# Patient Record
Sex: Male | Born: 1965 | Race: Black or African American | Hispanic: No | Marital: Single | Smoking: Current every day smoker
Health system: Southern US, Community
[De-identification: ages and names within clinical notes are randomized; demographics above are authoritative.]

## PROBLEM LIST (undated history)

## (undated) DIAGNOSIS — I10 Essential (primary) hypertension: Secondary | ICD-10-CM

## (undated) DIAGNOSIS — D649 Anemia, unspecified: Secondary | ICD-10-CM

## (undated) DIAGNOSIS — F1010 Alcohol abuse, uncomplicated: Secondary | ICD-10-CM

## (undated) DIAGNOSIS — J45909 Unspecified asthma, uncomplicated: Secondary | ICD-10-CM

## (undated) DIAGNOSIS — N179 Acute kidney failure, unspecified: Secondary | ICD-10-CM

## (undated) DIAGNOSIS — M1990 Unspecified osteoarthritis, unspecified site: Secondary | ICD-10-CM

## (undated) DIAGNOSIS — E538 Deficiency of other specified B group vitamins: Secondary | ICD-10-CM

## (undated) DIAGNOSIS — R195 Other fecal abnormalities: Secondary | ICD-10-CM

## (undated) HISTORY — PX: FRACTURE SURGERY: SHX138

## (undated) HISTORY — PX: COLONOSCOPY: SHX174

## (undated) HISTORY — PX: KNEE ARTHROCENTESIS: SUR44

---

## 2006-09-04 ENCOUNTER — Emergency Department: Payer: Self-pay | Admitting: Emergency Medicine

## 2009-11-20 ENCOUNTER — Ambulatory Visit: Payer: Self-pay | Admitting: Family Medicine

## 2009-12-07 ENCOUNTER — Ambulatory Visit: Payer: Self-pay | Admitting: Oncology

## 2009-12-12 ENCOUNTER — Ambulatory Visit: Payer: Self-pay | Admitting: Family Medicine

## 2009-12-12 ENCOUNTER — Ambulatory Visit: Payer: Self-pay | Admitting: Oncology

## 2010-01-07 ENCOUNTER — Ambulatory Visit: Payer: Self-pay | Admitting: Oncology

## 2010-05-12 ENCOUNTER — Ambulatory Visit: Payer: Self-pay | Admitting: Family Medicine

## 2010-10-21 ENCOUNTER — Ambulatory Visit: Payer: Self-pay | Admitting: Family Medicine

## 2010-10-28 ENCOUNTER — Ambulatory Visit: Payer: Self-pay | Admitting: Emergency Medicine

## 2012-08-17 ENCOUNTER — Ambulatory Visit: Payer: Self-pay | Admitting: Family Medicine

## 2012-08-18 ENCOUNTER — Encounter (HOSPITAL_COMMUNITY): Payer: Self-pay | Admitting: Emergency Medicine

## 2012-08-18 ENCOUNTER — Emergency Department (HOSPITAL_COMMUNITY)
Admission: EM | Admit: 2012-08-18 | Discharge: 2012-08-18 | Disposition: A | Discharge status: Home / Self Care | Payer: Medicaid Other | Attending: Emergency Medicine | Admitting: Emergency Medicine

## 2012-08-18 DIAGNOSIS — I10 Essential (primary) hypertension: Secondary | ICD-10-CM | POA: Insufficient documentation

## 2012-08-18 DIAGNOSIS — F17200 Nicotine dependence, unspecified, uncomplicated: Secondary | ICD-10-CM | POA: Insufficient documentation

## 2012-08-18 DIAGNOSIS — K047 Periapical abscess without sinus: Secondary | ICD-10-CM | POA: Insufficient documentation

## 2012-08-18 DIAGNOSIS — K029 Dental caries, unspecified: Secondary | ICD-10-CM | POA: Insufficient documentation

## 2012-08-18 HISTORY — DX: Essential (primary) hypertension: I10

## 2012-08-18 MED ORDER — OXYCODONE-ACETAMINOPHEN 5-325 MG PO TABS: ORAL_TABLET | ORAL | Status: DC

## 2012-08-18 MED ORDER — PENICILLIN V POTASSIUM 500 MG PO TABS: 500.0000 mg | ORAL_TABLET | Freq: Four times a day (QID) | ORAL | Status: AC

## 2012-08-18 MED ORDER — IBUPROFEN 800 MG PO TABS
800.0000 mg | ORAL_TABLET | Freq: Once | ORAL | Status: AC
  Administered 2012-08-18: 800 mg via ORAL
  Filled 2012-08-18: qty 1

## 2012-08-18 MED ORDER — OXYCODONE-ACETAMINOPHEN 5-325 MG PO TABS
1.0000 | ORAL_TABLET | Freq: Once | ORAL | Status: AC
  Administered 2012-08-18: 1 via ORAL
  Filled 2012-08-18: qty 1

## 2012-08-18 MED ORDER — PENICILLIN V POTASSIUM 250 MG PO TABS
500.0000 mg | ORAL_TABLET | Freq: Once | ORAL | Status: AC
  Administered 2012-08-18: 500 mg via ORAL
  Filled 2012-08-18: qty 2

## 2012-08-18 NOTE — ED Notes (Signed)
Pt presents with  Rt upper jaw/facial swelling secondary to an abscess in rt upper tooth. Pt has private dentist, however unable to see him today due to office closed. Pt states toothache started Monday.  Denies fever, N/v.

## 2012-08-18 NOTE — ED Provider Notes (Signed)
History     CSN: [id number]  Arrival date & time 08/18/12  1352   First MD Initiated Contact with Patient 08/18/12 1456      Chief Complaint  Patient presents with  . Dental Pain    (Consider location/radiation/quality/duration/timing/severity/associated sxs/prior treatment) HPI Comments: Tooth pain for 4 days.  Swelling getting worse over same time frame.  No fever or chills.   Has dental appt in 3 days.  The history is provided by the patient. No language interpreter was used.    Past Medical History  Diagnosis Date  . Hypertension     Past Surgical History  Procedure Date  . Knee arthrocentesis     No family history on file.  History  Substance Use Topics  . Smoking status: Current Every Day Smoker    Types: Cigarettes  . Smokeless tobacco: Not on file  . Alcohol Use: Yes      Review of Systems  HENT: Positive for dental problem.   Neurological: Positive for headaches.  All other systems reviewed and are negative.    Allergies  Review of patient's allergies indicates no known allergies.  Home Medications   Current Outpatient Rx  Name Route Sig Dispense Refill  . OXYCODONE-ACETAMINOPHEN 5-325 MG PO TABS  One tab po q 4-6 hrs prn pain 20 tablet 0  . PENICILLIN V POTASSIUM 500 MG PO TABS Oral Take 1 tablet (500 mg total) by mouth 4 (four) times daily. 40 tablet 0    BP 132/76  Pulse 93  Temp 98.8 F (37.1 C) (Oral)  Resp 20  Ht 5\' 7"  (1.702 m)  Wt 147 lb (66.679 kg)  BMI 23.02 kg/m2  SpO2 99%  Physical Exam  Nursing note and vitals reviewed. Constitutional: He is oriented to person, place, and time. He appears well-developed and well-nourished.  HENT:  Head: Normocephalic and atraumatic.    Mouth/Throat: Uvula is midline. Dental abscesses and dental caries present. No uvula swelling.    Eyes: EOM are normal.  Neck: Normal range of motion.  Cardiovascular: Normal rate, regular rhythm, normal heart sounds and intact distal pulses.     Pulmonary/Chest: Effort normal and breath sounds normal. No respiratory distress.  Abdominal: Soft. He exhibits no distension. There is no tenderness.  Musculoskeletal: Normal range of motion.  Neurological: He is alert and oriented to person, place, and time.  Skin: Skin is warm and dry.  Psychiatric: He has a normal mood and affect. Judgment normal.    ED Course  Procedures (including critical care time)  Labs Reviewed - No data to display No results found.   1. Dental abscess       MDM  rx-percocet, 20 rx-pen VK 500 mg QID x 10 days. Ibuprofen 800        Evalina Field, [place] 08/18/12 1546

## 2012-08-18 NOTE — ED Notes (Signed)
Pt c/o dental pain all week, but swelling started last night.

## 2012-08-22 NOTE — ED Provider Notes (Signed)
Medical screening examination/treatment/procedure(s) were performed by non-physician practitioner and as supervising physician I was immediately available for consultation/collaboration.   Benny Lennert, MD 08/22/12 1116

## 2013-04-30 ENCOUNTER — Encounter (HOSPITAL_COMMUNITY): Payer: Self-pay | Admitting: Emergency Medicine

## 2013-04-30 ENCOUNTER — Emergency Department (HOSPITAL_COMMUNITY): Payer: Medicaid Other

## 2013-04-30 ENCOUNTER — Emergency Department (HOSPITAL_COMMUNITY)
Admission: EM | Admit: 2013-04-30 | Discharge: 2013-04-30 | Disposition: A | Discharge status: Home / Self Care | Payer: Medicaid Other | Attending: Emergency Medicine | Admitting: Emergency Medicine

## 2013-04-30 DIAGNOSIS — Y9389 Activity, other specified: Secondary | ICD-10-CM | POA: Insufficient documentation

## 2013-04-30 DIAGNOSIS — W268XXA Contact with other sharp object(s), not elsewhere classified, initial encounter: Secondary | ICD-10-CM | POA: Insufficient documentation

## 2013-04-30 DIAGNOSIS — S61012A Laceration without foreign body of left thumb without damage to nail, initial encounter: Secondary | ICD-10-CM

## 2013-04-30 DIAGNOSIS — Z23 Encounter for immunization: Secondary | ICD-10-CM | POA: Insufficient documentation

## 2013-04-30 DIAGNOSIS — S61209A Unspecified open wound of unspecified finger without damage to nail, initial encounter: Secondary | ICD-10-CM | POA: Insufficient documentation

## 2013-04-30 DIAGNOSIS — I10 Essential (primary) hypertension: Secondary | ICD-10-CM | POA: Insufficient documentation

## 2013-04-30 DIAGNOSIS — Z791 Long term (current) use of non-steroidal anti-inflammatories (NSAID): Secondary | ICD-10-CM | POA: Insufficient documentation

## 2013-04-30 DIAGNOSIS — Y9289 Other specified places as the place of occurrence of the external cause: Secondary | ICD-10-CM | POA: Insufficient documentation

## 2013-04-30 HISTORY — DX: Alcohol abuse, uncomplicated: F10.10

## 2013-04-30 MED ORDER — LIDOCAINE HCL (PF) 2 % IJ SOLN
10.0000 mL | Freq: Once | INTRAMUSCULAR | Status: AC
  Administered 2013-04-30: 10 mL
  Filled 2013-04-30: qty 10

## 2013-04-30 MED ORDER — BACITRACIN ZINC 500 UNIT/GM EX OINT
TOPICAL_OINTMENT | CUTANEOUS | Status: AC
  Administered 2013-04-30: 1
  Filled 2013-04-30: qty 0.9

## 2013-04-30 MED ORDER — DOUBLE ANTIBIOTIC 500-10000 UNIT/GM EX OINT
TOPICAL_OINTMENT | Freq: Once | CUTANEOUS | Status: DC
  Filled 2013-04-30: qty 1

## 2013-04-30 MED ORDER — CEPHALEXIN 250 MG PO CAPS: 500.0000 mg | ORAL_CAPSULE | Freq: Two times a day (BID) | ORAL | Status: DC

## 2013-04-30 MED ORDER — TETANUS-DIPHTH-ACELL PERTUSSIS 5-2.5-18.5 LF-MCG/0.5 IM SUSP
0.5000 mL | Freq: Once | INTRAMUSCULAR | Status: AC
  Administered 2013-04-30: 0.5 mL via INTRAMUSCULAR
  Filled 2013-04-30: qty 0.5

## 2013-04-30 NOTE — ED Provider Notes (Addendum)
History     CSN: [id number]  Arrival date & time 04/30/13  1157   First MD Initiated Contact with Patient 04/30/13 1218      Chief Complaint  Patient presents with  . Laceration    (Consider location/radiation/quality/duration/timing/severity/associated sxs/prior treatment) HPI Wesley Francis is a 47 y.o. male who presents to the ED with a laceration to his left thumb. He cut the thumb just prior to arrival on a grill he was taking off a truck. He applied pressure and come to the ED. He is unsure of his last tetanus.  Past Medical History  Diagnosis Date  . Hypertension   . ETOH abuse     Past Surgical History  Procedure Laterality Date  . Knee arthrocentesis      Family History  Problem Relation Age of Onset  . Hypertension Mother   . Stroke Mother   . Hypertension Father     History  Substance Use Topics  . Smoking status: Current Every Day Smoker -- 1.00 packs/day    Types: Cigarettes  . Smokeless tobacco: Not on file  . Alcohol Use: Yes     Comment: Everyday drinker, whiskey      Review of Systems  Constitutional: Negative for fever.  HENT: Negative for neck pain.   Musculoskeletal:       Left thumb laceration, irregular, deep jagged.  Skin: Positive for wound.  Psychiatric/Behavioral: The patient is not nervous/anxious.     Allergies  Review of patient's allergies indicates no known allergies.  Home Medications   Current Outpatient Rx  Name  Route  Sig  Dispense  Refill  . naproxen sodium (ANAPROX) 220 MG tablet   Oral   Take 220 mg by mouth 2 (two) times daily with a meal.         . oxyCODONE-acetaminophen (PERCOCET/ROXICET) 5-325 MG per tablet      One tab po q 4-6 hrs prn pain   20 tablet   0     BP 118/80  Pulse 94  Temp(Src) 98.5 F (36.9 C) (Oral)  Resp 20  Ht 5\' 7"  (1.702 m)  Wt 160 lb (72.576 kg)  BMI 25.05 kg/m2  SpO2 96%  Physical Exam  Constitutional: He is oriented to person, place, and time. He appears  well-developed and well-nourished. No distress.  HENT:  Head: Normocephalic.  Eyes: EOM are normal.  Neck: Neck supple.  Cardiovascular: Normal rate.   Pulmonary/Chest: Effort normal.  Musculoskeletal:       Hands: Neurological: He is alert and oriented to person, place, and time.  Skin:  Laceration hand   Psychiatric: He has a normal mood and affect. His behavior is normal.    ED Course  Procedures (including critical care time)  LACERATION REPAIR Performed by: Jamesa Tedrick Authorized by: Alantis Bethune Consent: Verbal consent obtained. Risks and benefits: risks, benefits and alternatives were discussed Consent given by: patient Patient identity confirmed: provided demographic data Prepped and Draped in normal sterile fashion Wound explored  Laceration Location: left thumb  Laceration Length: 3 cm  No Foreign Bodies seen or palpated  Anesthesia: local infiltration  Local anesthetic: lidocaine 2% without epinephrine  Anesthetic total: 4 ml  Irrigation method: syringe Amount of cleaning: cleaned with betadine scrub brush and irrigated with 1000 ccs NSS  Skin closure: 4-0 prolene  Number of sutures: 8  Technique: interrupted  Patient tolerance: Patient tolerated the procedure well with no immediate complications.  Labs Reviewed - No data to display Dg Finger Thumb Left  04/30/2013   *RADIOLOGY REPORT*  Clinical Data: Left thumb pain and laceration following an injury.  LEFT THUMB 2+V  Comparison: None.  Findings: Large soft tissue defect in the ventral aspect of the left thumb at the level of the proximal phalanx.  This appears to extend to the underlying bone without fracture or radiopaque foreign body.  Mild first MCP joint degenerative changes.  IMPRESSION: Large soft tissue laceration without fracture or radiopaque foreign body.   Original Report Authenticated By: Beckie Salts, M.D.    MDM  47 y.o. male with deep laceration to the left hand, thumb area. Will d/c  patient home with antibiotics and he will follow up with his PCP in one week for suture removal or return here sooner for problems.    Medication List    TAKE these medications       cephALEXin 250 MG capsule  Commonly known as:  KEFLEX  Take 2 capsules (500 mg total) by mouth 2 (two) times daily.      ASK your doctor about these medications       naproxen sodium 220 MG tablet  Commonly known as:  ANAPROX  Take 220 mg by mouth 2 (two) times daily with a meal.     oxyCODONE-acetaminophen 5-325 MG per tablet  Commonly known as:  PERCOCET/ROXICET  One tab po q 4-6 hrs prn pain               Janne Napoleon, NP 04/30/13 1744  Sherrin Stahle Orlene Och, NP 05/05/13 1708

## 2013-04-30 NOTE — ED Notes (Signed)
Family reports they think the pt had tetanus shot at the health dept "not too long ago." Unsure of exact time frame.

## 2013-04-30 NOTE — ED Notes (Signed)
Pt with deep laceration to L thumb. Cut on a grill he was trying to get off a truck.

## 2013-05-01 NOTE — ED Provider Notes (Signed)
Medical screening examination/treatment/procedure(s) were performed by non-physician practitioner and as supervising physician I was immediately available for consultation/collaboration.   Ariel Wingrove, MD 05/01/13 0806 

## 2013-05-06 NOTE — ED Provider Notes (Signed)
Medical screening examination/treatment/procedure(s) were performed by non-physician practitioner and as supervising physician I was immediately available for consultation/collaboration.  Jaunice Mirza, MD 05/06/13 0023 

## 2013-05-22 ENCOUNTER — Ambulatory Visit (HOSPITAL_COMMUNITY): Payer: Medicaid Other | Admitting: Physical Therapy

## 2014-05-13 ENCOUNTER — Emergency Department: Payer: Self-pay | Admitting: Emergency Medicine

## 2015-02-07 ENCOUNTER — Encounter (HOSPITAL_COMMUNITY): Payer: Self-pay | Admitting: *Deleted

## 2015-02-07 ENCOUNTER — Emergency Department (HOSPITAL_COMMUNITY)
Admission: EM | Admit: 2015-02-07 | Discharge: 2015-02-07 | Disposition: A | Discharge status: Home / Self Care | Payer: Medicaid Other | Source: Home / Self Care | Attending: Emergency Medicine | Admitting: Emergency Medicine

## 2015-02-07 ENCOUNTER — Emergency Department (HOSPITAL_COMMUNITY)
Admission: EM | Admit: 2015-02-07 | Discharge: 2015-02-07 | Disposition: A | Discharge status: Home / Self Care | Payer: Medicaid Other | Attending: Emergency Medicine | Admitting: Emergency Medicine

## 2015-02-07 ENCOUNTER — Encounter (HOSPITAL_COMMUNITY): Payer: Self-pay | Admitting: Emergency Medicine

## 2015-02-07 DIAGNOSIS — Z791 Long term (current) use of non-steroidal anti-inflammatories (NSAID): Secondary | ICD-10-CM | POA: Insufficient documentation

## 2015-02-07 DIAGNOSIS — Z79899 Other long term (current) drug therapy: Secondary | ICD-10-CM

## 2015-02-07 DIAGNOSIS — R195 Other fecal abnormalities: Secondary | ICD-10-CM | POA: Insufficient documentation

## 2015-02-07 DIAGNOSIS — Z720 Tobacco use: Secondary | ICD-10-CM | POA: Insufficient documentation

## 2015-02-07 DIAGNOSIS — R040 Epistaxis: Secondary | ICD-10-CM | POA: Insufficient documentation

## 2015-02-07 DIAGNOSIS — I10 Essential (primary) hypertension: Secondary | ICD-10-CM | POA: Insufficient documentation

## 2015-02-07 DIAGNOSIS — R05 Cough: Secondary | ICD-10-CM

## 2015-02-07 DIAGNOSIS — R61 Generalized hyperhidrosis: Secondary | ICD-10-CM | POA: Insufficient documentation

## 2015-02-07 DIAGNOSIS — Z792 Long term (current) use of antibiotics: Secondary | ICD-10-CM | POA: Insufficient documentation

## 2015-02-07 MED ORDER — DM-GUAIFENESIN ER 30-600 MG PO TB12: 1.0000 | ORAL_TABLET | Freq: Two times a day (BID) | ORAL | Status: DC

## 2015-02-07 MED ORDER — AMLODIPINE BESYLATE 5 MG PO TABS
5.0000 mg | ORAL_TABLET | Freq: Once | ORAL | Status: AC
  Administered 2015-02-07: 5 mg via ORAL
  Filled 2015-02-07: qty 1

## 2015-02-07 MED ORDER — SILVER NITRATE-POT NITRATE 75-25 % EX MISC
CUTANEOUS | Status: AC
  Filled 2015-02-07: qty 1

## 2015-02-07 MED ORDER — HYDROCHLOROTHIAZIDE 25 MG PO TABS: 25.0000 mg | ORAL_TABLET | Freq: Every day | ORAL | Status: DC

## 2015-02-07 MED ORDER — BACITRACIN-NEOMYCIN-POLYMYXIN 400-5-5000 EX OINT
TOPICAL_OINTMENT | CUTANEOUS | Status: AC
  Filled 2015-02-07: qty 1

## 2015-02-07 NOTE — ED Provider Notes (Addendum)
CSN: [id number]     Arrival date & time 02/07/15  0820 History  This chart was scribed for Nat Christen, MD by Chester Holstein, ED Scribe. This patient was seen in room APA04/APA04 and the patient's care was started at 8:29 AM.    Chief Complaint  Patient presents with  . Epistaxis     Patient is a 49 y.o. male presenting with nosebleeds. The history is provided by the patient. No language interpreter was used.  Epistaxis  HPI Comments: Wesley Francis is a 49 y.o. male with PMHx of HTN and EtOH abuse who presents to the Emergency Department complaining of intermittent right sided epistaxis with onset yesterday morning. Pt notes last bleed was at 2:30 AM. Pt states bending over causes increased bleeding. Pt is not on blood thinners. Pt is not compliant with his BP medication. Pt's BP 157/108 in exam room. Pt is being seen by a speech therapist for disability workup. Pt's wife denies h/o CVA and noted changes in speech are recent. Pt denies any other medical complaints. Pt has an appointment with PCP this week. Pt's PCP is [facility].   Past Medical History  Diagnosis Date  . Hypertension   . ETOH abuse    Past Surgical History  Procedure Laterality Date  . Knee arthrocentesis     Family History  Problem Relation Age of Onset  . Hypertension Mother   . Stroke Mother   . Hypertension Father    History  Substance Use Topics  . Smoking status: Current Every Day Smoker -- 1.00 packs/day    Types: Cigarettes  . Smokeless tobacco: Not on file  . Alcohol Use: Yes     Comment: Everyday drinker, whiskey    Review of Systems  HENT: Positive for nosebleeds.    A complete 10 system review of systems was obtained and all systems are negative except as noted in the HPI and PMH.     Allergies  Review of patient's allergies indicates no known allergies.  Home Medications   Prior to Admission medications   Medication Sig Start Date End Date Taking? Authorizing Provider  cephALEXin  (KEFLEX) 250 MG capsule Take 2 capsules (500 mg total) by mouth 2 (two) times daily. 04/30/13   Hope Bunnie Pion, NP  hydrochlorothiazide (HYDRODIURIL) 25 MG tablet Take 1 tablet (25 mg total) by mouth daily. 02/07/15   Nat Christen, MD  naproxen sodium (ANAPROX) 220 MG tablet Take 220 mg by mouth 2 (two) times daily with a meal.    Historical Provider, MD  oxyCODONE-acetaminophen (PERCOCET/ROXICET) 5-325 MG per tablet One tab po q 4-6 hrs prn pain 08/18/12   Richard Sabra Heck, PA-C   BP 142/101 mmHg  Pulse 70  Temp(Src) 97.7 F (36.5 C) (Oral)  Resp 18  Ht 5\' 10"  (1.778 m)  Wt 160 lb (72.576 kg)  BMI 22.96 kg/m2  SpO2 100% Physical Exam  Constitutional: He is oriented to person, place, and time. He appears well-developed and well-nourished.  HENT:  Head: Normocephalic and atraumatic.  Clotted blood in the medial aspect of right nares  Eyes: Conjunctivae and EOM are normal. Pupils are equal, round, and reactive to light.  Neck: Normal range of motion. Neck supple.  Cardiovascular: Normal rate and regular rhythm.   Pulmonary/Chest: Effort normal and breath sounds normal.  Abdominal: Soft. Bowel sounds are normal.  Musculoskeletal: Normal range of motion.  Neurological: He is alert and oriented to person, place, and time.  Skin: Skin is warm and dry.  Psychiatric:  He has a normal mood and affect. His behavior is normal.  Nursing note and vitals reviewed.   ED Course  EPISTAXIS MANAGEMENT Date/Time: 02/07/2015 10:00 AM Performed by: Nat Christen Authorized by: Nat Christen Consent: Verbal consent obtained. Risks and benefits: risks, benefits and alternatives were discussed Consent given by: patient Patient understanding: patient states understanding of the procedure being performed Patient identity confirmed: verbally with patient Patient sedated: no Treatment site: right posterior Post-procedure assessment: bleeding stopped Treatment complexity: simple Comments: Patient examined with  headlight, nasal forceps.  No anterior bleeding. Oropharyngeal area examined. No obvious blood in throat.   (including critical care time) DIAGNOSTIC STUDIES: Oxygen Saturation is 100% on room air, normal by my interpretation.    COORDINATION OF CARE: 8:31 AM Discussed treatment plan with patient at beside, the patient agrees with the plan and has no further questions at this time.   Labs Review Labs Reviewed - No data to display  Imaging Review No results found.   EKG Interpretation None     No results found for this or any previous visit. No results found.   MDM   Final diagnoses:  Right-sided epistaxis  Essential hypertension   Right nares examined. No active bleeding. Discussed treatment options including vaporizer, saline drops, Neosporin ointment. Will start hydrochlorothiazide 25 mg daily. This was discussed with the patient and his sister. He will get primary care follow-up.    I personally performed the services described in this documentation, which was scribed in my presence. The recorded information has been reviewed and is accurate.     Nat Christen, MD 02/07/15 [place], MD 02/20/15 2207

## 2015-02-07 NOTE — Discharge Instructions (Signed)
Nosebleed A nosebleed can be caused by many things, including:  Getting hit hard in the nose.  Infections.  Dry nose.  Colds.  Medicines. Your doctor may do lab testing if you get nosebleeds a lot and the cause is not known. HOME CARE   If your nose was packed with material, keep it there until your doctor takes it out. Put the pack back in your nose if the pack falls out.  Do not blow your nose for 12 hours after the nosebleed.  Sit up and bend forward if your nose starts bleeding again. Pinch the front half of your nose nonstop for 20 minutes.  Put petroleum jelly inside your nose every morning if you have a dry nose.  Use a humidifier to make the air less dry.  Do not take aspirin.  Try not to strain, lift, or bend at the waist for many days after the nosebleed. GET HELP RIGHT AWAY IF:   Nosebleeds keep happening and are hard to stop or control.  You have bleeding or bruises that are not normal on other parts of the body.  You have a fever.  The nosebleeds get worse.  You get lightheaded, feel faint, sweaty, or throw up (vomit) blood. MAKE SURE YOU:   Understand these instructions.  Will watch your condition.  Will get help right away if you are not doing well or get worse. Document Released: 09/01/2008 Document Revised: 02/15/2012 Document Reviewed: 09/01/2008 [facility] Patient Information 2015 [place], [place]. This information is not intended to replace advice given to you by your health care provider. Make sure you discuss any questions you have with your health care provider.   Start taking the Mucinex DM. Usingtechnique that we described. Bleeding does not stop return. If bleeding and set being recurrent even small amounts over the next several weeks follow-up with ear nose and throat.

## 2015-02-07 NOTE — ED Notes (Signed)
Social work at bedside.  

## 2015-02-07 NOTE — ED Notes (Signed)
Pt recently discharged this morning for nose bleed. Pt states nose began bleeding again 30 min after leaving the ED

## 2015-02-07 NOTE — ED Notes (Signed)
Pt. Wanting to fill out advance directive and have notarized while here. Social work notified to go over paperwork with pt.

## 2015-02-07 NOTE — ED Provider Notes (Signed)
CSN: [id number]     Arrival date & time 02/07/15  1226 History  This chart was scribed for Fredia Sorrow, MD by Chester Holstein, ED Scribe. This patient was seen in room APA19/APA19 and the patient's care was started at 1:33 PM.    Chief Complaint  Patient presents with  . Epistaxis    Patient is a 49 y.o. male presenting with nosebleeds. The history is provided by the patient and the spouse. No language interpreter was used.  Epistaxis Associated symptoms: cough   Associated symptoms: no congestion, no fever, no headaches and no sore throat     HPI Comments: Wesley Francis is a 49 y.o. male who presents to the Emergency Department complaining of recurrent right sided epistaxis just PTA. Pt notes he bled twice yesterday. Pt was seen and discharged from ED earlier today for same. Pt states he went to [facility] to fill his prescription and states right nare began bleeding again. Pt's spouse notes she held pt's nose with head back with no relief. Pt noted post nasal blood in throat. Pt notes associated productive cough, rhinorrhea, and hematochezia from swallowing blood  Pt denies any other medical complaints. Pt denies headache, abdominal pain, chest pain, and SOB.   Past Medical History  Diagnosis Date  . Hypertension   . ETOH abuse    Past Surgical History  Procedure Laterality Date  . Knee arthrocentesis     Family History  Problem Relation Age of Onset  . Hypertension Mother   . Stroke Mother   . Hypertension Father    History  Substance Use Topics  . Smoking status: Current Every Day Smoker -- 1.00 packs/day    Types: Cigarettes  . Smokeless tobacco: Not on file  . Alcohol Use: Yes     Comment: Everyday drinker, whiskey    Review of Systems  Constitutional: Positive for diaphoresis. Negative for fever and chills.  HENT: Positive for nosebleeds, postnasal drip (blood) and rhinorrhea. Negative for congestion and sore throat.   Eyes: Negative for visual disturbance.   Respiratory: Positive for cough. Negative for shortness of breath.   Cardiovascular: Negative for chest pain and leg swelling.  Gastrointestinal: Positive for blood in stool (pt reports swallowing blood). Negative for nausea, vomiting, abdominal pain and diarrhea.  Genitourinary: Negative for dysuria.  Musculoskeletal: Negative for back pain.  Skin: Negative for rash.  Neurological: Negative for headaches.  Hematological: Does not bruise/bleed easily.  Psychiatric/Behavioral: Negative for confusion.      Allergies  Review of patient's allergies indicates no known allergies.  Home Medications   Prior to Admission medications   Medication Sig Start Date End Date Taking? Authorizing Provider  Cholecalciferol (VITAMIN D PO) Take 1 tablet by mouth daily.   Yes Historical Provider, MD  naproxen sodium (ANAPROX) 220 MG tablet Take 220 mg by mouth daily as needed (pain).    Yes Historical Provider, MD  cephALEXin (KEFLEX) 250 MG capsule Take 2 capsules (500 mg total) by mouth 2 (two) times daily. Patient not taking: Reported on 02/07/2015 04/30/13   Ashley Murrain, NP  dextromethorphan-[facility]in [facility] DM) 30-600 MG per 12 hr tablet Take 1 tablet by mouth 2 (two) times daily. 02/07/15   Fredia Sorrow, MD  hydrochlorothiazide (HYDRODIURIL) 25 MG tablet Take 1 tablet (25 mg total) by mouth daily. 02/07/15   Nat Christen, MD  oxyCODONE-acetaminophen (PERCOCET/ROXICET) 5-325 MG per tablet One tab po q 4-6 hrs prn pain Patient not taking: Reported on 02/07/2015 08/18/12   Jennye Boroughs,  PA-C   BP 135/99 mmHg  Pulse 98  Temp(Src) 99.7 F (37.6 C) (Oral)  Resp 16  SpO2 100% Physical Exam  Constitutional: He is oriented to person, place, and time. He appears well-developed and well-nourished.  HENT:  Head: Normocephalic.  Mild irritation to bilateral nares; no active bleeding noted.  Eyes: Conjunctivae and EOM are normal. Pupils are equal, round, and reactive to light.  Neck: Normal range of  motion. Neck supple.  Cardiovascular: Normal rate, regular rhythm and normal heart sounds.  Exam reveals no friction rub.   No murmur heard. Pulmonary/Chest: Effort normal and breath sounds normal. No respiratory distress. He has no wheezes. He has no rales.  Abdominal: Soft. Bowel sounds are normal. There is no tenderness.  Musculoskeletal: Normal range of motion. He exhibits no edema.  Neurological: He is alert and oriented to person, place, and time. No cranial nerve deficit. He exhibits normal muscle tone. Coordination normal.  Skin: Skin is warm and dry.  Psychiatric: He has a normal mood and affect. His behavior is normal.  Nursing note and vitals reviewed.   ED Course  Procedures (including critical care time) DIAGNOSTIC STUDIES: Oxygen Saturation is 99% on room air, normal by my interpretation.    COORDINATION OF CARE: 2:11 PM Discussed treatment plan with patient at beside including 20 minutes of continuous pressure with head down and blowing nose to release clots, repeating procedure if necessary, the patient agrees with the plan and has no further questions at this time.   Labs Review Labs Reviewed - No data to display  Imaging Review No results found.   EKG Interpretation None      MDM   Final diagnoses:  Epistaxis   No activebleeding currently. Patient given instructions on how to deal with recurrent nosebleed to the pinch technique. We'll also start on Mucinex DM. Referral to ear nose and throat provided if that does not resolve itself over a few days. Instructions on when to return provided. Patient does not clinically warranted Rhino Rocket at this time. However it was offered if he is overly concerned patient decided that he did not want that is not clinically inappropriate.  I personally performed the services described in this documentation, which was scribed in my presence. The recorded information has been reviewed and is accurate.      Fredia Sorrow, MD 02/07/15 1426

## 2015-02-07 NOTE — ED Notes (Signed)
MD at bedside. 

## 2015-02-07 NOTE — ED Notes (Signed)
Pt c/o intermittent nosebleed since yesterday. Pt states right side has been bleeding since 0230 this am.

## 2015-02-07 NOTE — ED Notes (Signed)
Pt spouse states his nose starred bleeding again just a few minutes after leaving here. Pt has a gauze inserted in his right nostril

## 2015-02-07 NOTE — Discharge Instructions (Signed)
Salt water drops to nose, antibiotic ointment, vaporizer;  Start new blood pressure medication.  You will need primary care follow up

## 2015-02-07 NOTE — ED Notes (Signed)
Social work called to provide pt. With advance directive info.

## 2015-02-07 NOTE — ED Notes (Signed)
Suction and ENT head light at bedside for EDP.

## 2015-02-09 ENCOUNTER — Emergency Department: Payer: Self-pay | Admitting: Student

## 2016-04-03 ENCOUNTER — Encounter (HOSPITAL_COMMUNITY): Payer: Self-pay | Admitting: Emergency Medicine

## 2016-04-03 ENCOUNTER — Emergency Department (HOSPITAL_COMMUNITY): Payer: Medicaid Other

## 2016-04-03 ENCOUNTER — Inpatient Hospital Stay (HOSPITAL_COMMUNITY)
Admission: EM | Admit: 2016-04-03 | Discharge: 2016-04-06 | DRG: 684 | Disposition: A | Discharge status: Home Health | Payer: Medicaid Other | Attending: Internal Medicine | Admitting: Internal Medicine

## 2016-04-03 DIAGNOSIS — E538 Deficiency of other specified B group vitamins: Secondary | ICD-10-CM | POA: Diagnosis present

## 2016-04-03 DIAGNOSIS — F1010 Alcohol abuse, uncomplicated: Secondary | ICD-10-CM | POA: Diagnosis present

## 2016-04-03 DIAGNOSIS — M47816 Spondylosis without myelopathy or radiculopathy, lumbar region: Secondary | ICD-10-CM

## 2016-04-03 DIAGNOSIS — M545 Low back pain: Secondary | ICD-10-CM

## 2016-04-03 DIAGNOSIS — M5440 Lumbago with sciatica, unspecified side: Secondary | ICD-10-CM | POA: Diagnosis present

## 2016-04-03 DIAGNOSIS — R0989 Other specified symptoms and signs involving the circulatory and respiratory systems: Secondary | ICD-10-CM | POA: Insufficient documentation

## 2016-04-03 DIAGNOSIS — F10120 Alcohol abuse with intoxication, uncomplicated: Secondary | ICD-10-CM | POA: Diagnosis present

## 2016-04-03 DIAGNOSIS — R195 Other fecal abnormalities: Secondary | ICD-10-CM | POA: Diagnosis present

## 2016-04-03 DIAGNOSIS — Z86010 Personal history of colonic polyps: Secondary | ICD-10-CM

## 2016-04-03 DIAGNOSIS — F10929 Alcohol use, unspecified with intoxication, unspecified: Secondary | ICD-10-CM | POA: Insufficient documentation

## 2016-04-03 DIAGNOSIS — F17210 Nicotine dependence, cigarettes, uncomplicated: Secondary | ICD-10-CM | POA: Diagnosis present

## 2016-04-03 DIAGNOSIS — N179 Acute kidney failure, unspecified: Principal | ICD-10-CM | POA: Diagnosis present

## 2016-04-03 DIAGNOSIS — M79604 Pain in right leg: Secondary | ICD-10-CM | POA: Diagnosis present

## 2016-04-03 DIAGNOSIS — I10 Essential (primary) hypertension: Secondary | ICD-10-CM | POA: Diagnosis present

## 2016-04-03 DIAGNOSIS — D649 Anemia, unspecified: Secondary | ICD-10-CM | POA: Diagnosis present

## 2016-04-03 DIAGNOSIS — G8929 Other chronic pain: Secondary | ICD-10-CM | POA: Diagnosis present

## 2016-04-03 DIAGNOSIS — Z720 Tobacco use: Secondary | ICD-10-CM | POA: Diagnosis present

## 2016-04-03 DIAGNOSIS — M79606 Pain in leg, unspecified: Secondary | ICD-10-CM

## 2016-04-03 DIAGNOSIS — N289 Disorder of kidney and ureter, unspecified: Secondary | ICD-10-CM

## 2016-04-03 HISTORY — DX: Deficiency of other specified B group vitamins: E53.8

## 2016-04-03 HISTORY — DX: Other fecal abnormalities: R19.5

## 2016-04-03 HISTORY — DX: Acute kidney failure, unspecified: N17.9

## 2016-04-03 LAB — CBC WITH DIFFERENTIAL/PLATELET
BASOS PCT: 1 %
Basophils Absolute: 0 10*3/uL (ref 0.0–0.1)
EOS ABS: 0.1 10*3/uL (ref 0.0–0.7)
Eosinophils Relative: 2 %
HCT: 27.1 % — ABNORMAL LOW (ref 39.0–52.0)
Hemoglobin: 9.2 g/dL — ABNORMAL LOW (ref 13.0–17.0)
LYMPHS ABS: 0.9 10*3/uL (ref 0.7–4.0)
Lymphocytes Relative: 23 %
MCH: 29.1 pg (ref 26.0–34.0)
MCHC: 33.9 g/dL (ref 30.0–36.0)
MCV: 85.8 fL (ref 78.0–100.0)
MONO ABS: 0.5 10*3/uL (ref 0.1–1.0)
MONOS PCT: 13 %
Neutro Abs: 2.4 10*3/uL (ref 1.7–7.7)
Neutrophils Relative %: 61 %
Platelets: 189 10*3/uL (ref 150–400)
RBC: 3.16 MIL/uL — ABNORMAL LOW (ref 4.22–5.81)
RDW: 17.8 % — AB (ref 11.5–15.5)
WBC: 4 10*3/uL (ref 4.0–10.5)

## 2016-04-03 LAB — COMPREHENSIVE METABOLIC PANEL
ALBUMIN: 4.4 g/dL (ref 3.5–5.0)
ALK PHOS: 18 U/L — AB (ref 38–126)
ALT: 29 U/L (ref 17–63)
AST: 113 U/L — AB (ref 15–41)
Anion gap: 19 — ABNORMAL HIGH (ref 5–15)
BILIRUBIN TOTAL: 0.7 mg/dL (ref 0.3–1.2)
BUN: 67 mg/dL — AB (ref 6–20)
CALCIUM: 9.3 mg/dL (ref 8.9–10.3)
CO2: 24 mmol/L (ref 22–32)
CREATININE: 3.89 mg/dL — AB (ref 0.61–1.24)
Chloride: 92 mmol/L — ABNORMAL LOW (ref 101–111)
GFR calc Af Amer: 19 mL/min — ABNORMAL LOW (ref >60)
GFR calc non Af Amer: 17 mL/min — ABNORMAL LOW (ref >60)
GLUCOSE: 75 mg/dL (ref 65–99)
Potassium: 3.9 mmol/L (ref 3.5–5.1)
Sodium: 135 mmol/L (ref 135–145)
TOTAL PROTEIN: 7.4 g/dL (ref 6.5–8.1)

## 2016-04-03 LAB — URINE MICROSCOPIC-ADD ON

## 2016-04-03 LAB — ETHANOL: Alcohol, Ethyl (B): 289 mg/dL — ABNORMAL HIGH (ref <5)

## 2016-04-03 LAB — RAPID URINE DRUG SCREEN, HOSP PERFORMED
AMPHETAMINES: NOT DETECTED
BENZODIAZEPINES: NOT DETECTED
Barbiturates: NOT DETECTED
COCAINE: NOT DETECTED
OPIATES: NOT DETECTED
TETRAHYDROCANNABINOL: NOT DETECTED

## 2016-04-03 LAB — URINALYSIS, ROUTINE W REFLEX MICROSCOPIC
GLUCOSE, UA: NEGATIVE mg/dL
Leukocytes, UA: NEGATIVE
Nitrite: NEGATIVE
PH: 5.5 (ref 5.0–8.0)
Protein, ur: 30 mg/dL — AB
SPECIFIC GRAVITY, URINE: 1.02 (ref 1.005–1.030)

## 2016-04-03 NOTE — ED Notes (Addendum)
Pt admits to drinking a beer & some sips of liquor today. Pain has been ongoing the past month.  Says uses alcohol to help w/ pain. Family concerned about pt drinking.

## 2016-04-03 NOTE — ED Notes (Addendum)
Patient complaining of headache and bilateral leg pain x 2 days. Patient states "just cut my legs off, I'm tired of them hurting." Family member states he was seen by health department and given B-12 "to see if that helps." Patient admits to drinking "one beer" today.

## 2016-04-03 NOTE — ED Provider Notes (Signed)
CSN: [id number]     Arrival date & time 04/03/16  2025 History  By signing my name below, I, Dora Sims, attest that this documentation has been prepared under the direction and in the presence of physician practitioner, Daleen Bo, MD. Electronically Signed: Dora Sims, Scribe. 04/03/2016. 8:52 PM.   Chief Complaint  Patient presents with  . Leg Pain  . Headache    The history is provided by the patient and a relative. No language interpreter was used.    HPI Comments: Wesley Francis is a 50 y.o. male with h/o arthritis and HTN who presents to the Emergency Department complaining of constant, worsening, bilateral leg pain for the last 7 months. Pt reports that his leg pain radiates throughout his entire legs bilaterally. Per his family, pt experienced a fall around a month ago due to his legs "giving out" on him; he did not pass out. His legs have been "giving out" more often since the fall. He does not ambulate with a cane. He was prescribed B12 medication during his visit to the health department one month ago. He regularly takes HTN medication, B12, Vitamin D, ibuprofen, and Tylenol. Pt endorses bilateral knee pain exacerbation with flexion and any other type of movement. Pt also reports associated back pain; he has a h/o of issues with his back. He has never seen a physical therapist for his pains. He reports that he smokes 1 pack a day and drinks small amounts of beer, liquor, and wine occasionally. He denies dysuria, difficulty urinating, bowel problems, abdominal pain, or any other associated symptoms.  Per his family, pt also complains of recent headache and blurry vision x 2 days. Per his family, pt also experiences regular hiccups with no known cause.  Past Medical History  Diagnosis Date  . Hypertension   . ETOH abuse    Past Surgical History  Procedure Laterality Date  . Knee arthrocentesis     Family History  Problem Relation Age of Onset  . Hypertension Mother    . Stroke Mother   . Hypertension Father    Social History  Substance Use Topics  . Smoking status: Current Every Day Smoker -- 1.00 packs/day    Types: Cigarettes  . Smokeless tobacco: None  . Alcohol Use: Yes     Comment: Everyday drinker, whiskey    Review of Systems  Eyes: Positive for visual disturbance.  Gastrointestinal: Negative for abdominal pain.  Genitourinary: Negative for dysuria and difficulty urinating.  Musculoskeletal: Positive for back pain and arthralgias (bilateral legs).  Neurological: Positive for headaches.  All other systems reviewed and are negative.   Allergies  Review of patient's allergies indicates no known allergies.  Home Medications   Prior to Admission medications   Medication Sig Start Date End Date Taking? Authorizing Provider  Cholecalciferol (VITAMIN D) 2000 units CAPS Take 1 capsule by mouth daily.   Yes Historical Provider, MD  Cyanocobalamin ([facility] B-12) 1500 MCG TBDP Take 1 tablet by mouth daily.   Yes Historical Provider, MD  lisinopril-hydrochlorothiazide (PRINZIDE,ZESTORETIC) 20-12.5 MG tablet Take 1 tablet by mouth daily.   Yes Historical Provider, MD   BP 100/70 mmHg  Pulse 91  Temp(Src) 98.4 F (36.9 C) (Oral)  Resp 20  Ht 5\' 8"  (1.727 m)  Wt 142 lb (64.411 kg)  BMI 21.60 kg/m2  SpO2 96% Physical Exam  Constitutional: He is oriented to person, place, and time. He appears well-developed and well-nourished.  HENT:  Head: Normocephalic and atraumatic.  Right Ear:  External ear normal.  Left Ear: External ear normal.  Mouth/Throat: Mucous membranes are normal.  Eyes: EOM are normal. Pupils are equal, round, and reactive to light. Right conjunctiva is injected. Left conjunctiva is injected.  Injected conjunctiva bilaterally  Neck: Normal range of motion and phonation normal. Neck supple.  Cardiovascular: Normal rate, regular rhythm and normal heart sounds.   Normal dorsalis pedis pulses bilaterally  Pulmonary/Chest: Effort  normal and breath sounds normal. He exhibits no bony tenderness.  Abdominal: Soft. There is no tenderness.  No hepatosplenomegaly  Musculoskeletal: Normal range of motion.  No swelling or deformity bilateral knees Normal active and passive range of motion in arms and legs bilaterally Mild pain in both knees with movement Normal strength arms and legs bilaterally  Neurological: He is alert and oriented to person, place, and time. No cranial nerve deficit or sensory deficit. He exhibits normal muscle tone. Coordination normal.  Skin: Skin is warm, dry and intact.  Psychiatric: He has a normal mood and affect. His behavior is normal. Judgment and thought content normal.  Nursing note and vitals reviewed.   ED Course  Procedures (including critical care time)  DIAGNOSTIC STUDIES: Oxygen Saturation is 98% on RA, normal by my interpretation.    COORDINATION OF CARE: 8:52 PM Discussed treatment plan with pt at bedside and pt agreed to plan.  Medications - No data to display  Patient Vitals for the past 24 hrs:  BP Temp Temp src Pulse Resp SpO2 Height Weight  04/03/16 2300 100/70 mmHg - - 91 - 96 % - -  04/03/16 2200 103/58 mmHg - - - 20 - - -  04/03/16 2026 (!) 85/53 mmHg 98.4 F (36.9 C) Oral 98 14 98 % 5\' 8"  (1.727 m) 142 lb (64.411 kg)   12:00 AM Reevaluation with update and discussion. After initial assessment and treatment, an updated evaluation reveals no change in clinical status. Findings discussed with patient and family members, all questions were answered.Daleen Bo L   23:58Consult complete with Dr. Marin Comment. Patient case explained and discussed. He agrees to admit patient for further evaluation and treatment. Call ended at 00:05 Labs Review Labs Reviewed  COMPREHENSIVE METABOLIC PANEL - Abnormal; Notable for the following:    Chloride 92 (*)    BUN 67 (*)    Creatinine, Ser 3.89 (*)    AST 113 (*)    Alkaline Phosphatase 18 (*)    GFR calc non Af Amer 17 (*)    GFR  calc Af Amer 19 (*)    Anion gap 19 (*)    All other components within normal limits  ETHANOL - Abnormal; Notable for the following:    Alcohol, Ethyl (B) 289 (*)    All other components within normal limits  CBC WITH DIFFERENTIAL/PLATELET - Abnormal; Notable for the following:    RBC 3.16 (*)    Hemoglobin 9.2 (*)    HCT 27.1 (*)    RDW 17.8 (*)    All other components within normal limits  URINALYSIS, ROUTINE W REFLEX MICROSCOPIC (NOT AT [facility]) - Abnormal; Notable for the following:    Hgb urine dipstick SMALL (*)    Bilirubin Urine SMALL (*)    Ketones, ur TRACE (*)    Protein, ur 30 (*)    All other components within normal limits  URINE MICROSCOPIC-ADD ON - Abnormal; Notable for the following:    Squamous Epithelial / LPF 0-5 (*)    Bacteria, UA RARE (*)    Casts HYALINE  CASTS (*)    All other components within normal limits  URINE RAPID DRUG SCREEN, HOSP PERFORMED    Imaging Review Ct Head Wo Contrast  04/03/2016  CLINICAL DATA:  50 year old male with constant worsening bilateral leg pain for the past 7 months. Fall 1 month ago. Chronic back pain. EXAM: CT HEAD WITHOUT CONTRAST TECHNIQUE: Contiguous axial images were obtained from the base of the skull through the vertex without intravenous contrast. COMPARISON:  Head CT 05/13/2014. FINDINGS: No acute intracranial abnormalities. Specifically, no evidence of acute intracranial hemorrhage, no definite findings of acute/subacute cerebral ischemia, no mass, mass effect, hydrocephalus or abnormal intra or extra-axial fluid collections. Visualized paranasal sinuses and mastoids are well pneumatized. No acute displaced skull fractures are identified. IMPRESSION: *No acute intracranial abnormalities. *The appearance of the brain is normal. Electronically Signed   By: Vinnie Langton M.D.   On: 04/03/2016 22:56   Ct Lumbar Spine Wo Contrast  04/03/2016  CLINICAL DATA:  Bilateral leg pain for 7 months. "Legs giving out" EXAM: CT LUMBAR  SPINE WITHOUT CONTRAST TECHNIQUE: Multidetector CT imaging of the lumbar spine was performed without intravenous contrast administration. Multiplanar CT image reconstructions were also generated. COMPARISON:  Lumbar spine MRI 08/17/2012 FINDINGS: No acute fracture, subluxation, endplate erosion, or evidence of focal bone lesion. No retroperitoneal finding to explain the history. There is spondylotic endplate spurring and degenerative disc narrowing. No canal stenosis. L3-4 right far-lateral disc bulging and L3 contact is regressed from prior. Mild to moderate L5-S1 left foraminal stenosis from disc bulge, stable. IMPRESSION: 1. No acute finding. No canal stenosis to explain bilateral leg symptoms. 2. Degenerative disc disease without noted progression compared to 2013 MRI. Electronically Signed   By: Monte Fantasia M.D.   On: 04/03/2016 22:41   I have personally reviewed and evaluated these images and lab results as part of my medical decision-making.    MDM   Final diagnoses:  Renal insufficiency  Alcohol intoxication, with unspecified complication (HCC)  Low back pain with sciatica, sciatica laterality unspecified, unspecified back pain laterality  Lumbar spondylosis, unspecified spinal osteoarthritis    Nonspecific low back and leg pain. Likely lumbar radiculopathy versus sciatica. Marked alcohol intoxication, and renal insufficiency of unknown duration. Patient is significantly prerenal. Therefore, is likely volume depleted. Instability. Anemia, likely multifactorial.   Plan: Admit  I personally performed the services described in this documentation, which was scribed in my presence. The recorded information has been reviewed and is accurate.      Daleen Bo, MD 04/04/16 [phone]

## 2016-04-04 ENCOUNTER — Observation Stay (HOSPITAL_COMMUNITY): Payer: Medicaid Other

## 2016-04-04 ENCOUNTER — Encounter (HOSPITAL_COMMUNITY): Payer: Self-pay | Admitting: Internal Medicine

## 2016-04-04 DIAGNOSIS — I10 Essential (primary) hypertension: Secondary | ICD-10-CM | POA: Diagnosis present

## 2016-04-04 DIAGNOSIS — M79606 Pain in leg, unspecified: Secondary | ICD-10-CM | POA: Diagnosis not present

## 2016-04-04 DIAGNOSIS — N179 Acute kidney failure, unspecified: Secondary | ICD-10-CM | POA: Diagnosis present

## 2016-04-04 DIAGNOSIS — F17210 Nicotine dependence, cigarettes, uncomplicated: Secondary | ICD-10-CM | POA: Diagnosis present

## 2016-04-04 DIAGNOSIS — E538 Deficiency of other specified B group vitamins: Secondary | ICD-10-CM | POA: Diagnosis present

## 2016-04-04 DIAGNOSIS — D649 Anemia, unspecified: Secondary | ICD-10-CM | POA: Diagnosis present

## 2016-04-04 DIAGNOSIS — M545 Low back pain, unspecified: Secondary | ICD-10-CM | POA: Diagnosis present

## 2016-04-04 DIAGNOSIS — G8929 Other chronic pain: Secondary | ICD-10-CM | POA: Diagnosis present

## 2016-04-04 DIAGNOSIS — Z86010 Personal history of colonic polyps: Secondary | ICD-10-CM | POA: Diagnosis not present

## 2016-04-04 DIAGNOSIS — F10120 Alcohol abuse with intoxication, uncomplicated: Secondary | ICD-10-CM | POA: Diagnosis present

## 2016-04-04 DIAGNOSIS — M5440 Lumbago with sciatica, unspecified side: Secondary | ICD-10-CM | POA: Diagnosis present

## 2016-04-04 DIAGNOSIS — Z720 Tobacco use: Secondary | ICD-10-CM | POA: Diagnosis present

## 2016-04-04 DIAGNOSIS — F1010 Alcohol abuse, uncomplicated: Secondary | ICD-10-CM | POA: Diagnosis present

## 2016-04-04 DIAGNOSIS — M79604 Pain in right leg: Secondary | ICD-10-CM | POA: Diagnosis present

## 2016-04-04 DIAGNOSIS — R0989 Other specified symptoms and signs involving the circulatory and respiratory systems: Secondary | ICD-10-CM | POA: Diagnosis not present

## 2016-04-04 DIAGNOSIS — F10129 Alcohol abuse with intoxication, unspecified: Secondary | ICD-10-CM | POA: Diagnosis not present

## 2016-04-04 HISTORY — DX: Acute kidney failure, unspecified: N17.9

## 2016-04-04 LAB — IRON AND TIBC
IRON: 109 ug/dL (ref 45–182)
SATURATION RATIOS: 37 % (ref 17.9–39.5)
TIBC: 297 ug/dL (ref 250–450)
UIBC: 188 ug/dL

## 2016-04-04 LAB — BASIC METABOLIC PANEL
ANION GAP: 17 — AB (ref 5–15)
BUN: 63 mg/dL — ABNORMAL HIGH (ref 6–20)
CHLORIDE: 99 mmol/L — AB (ref 101–111)
CO2: 21 mmol/L — AB (ref 22–32)
Calcium: 8.7 mg/dL — ABNORMAL LOW (ref 8.9–10.3)
Creatinine, Ser: 2.79 mg/dL — ABNORMAL HIGH (ref 0.61–1.24)
GFR calc non Af Amer: 25 mL/min — ABNORMAL LOW (ref >60)
GFR, EST AFRICAN AMERICAN: 29 mL/min — AB (ref >60)
Glucose, Bld: 72 mg/dL (ref 65–99)
POTASSIUM: 3.7 mmol/L (ref 3.5–5.1)
Sodium: 137 mmol/L (ref 135–145)

## 2016-04-04 LAB — TSH: TSH: 0.924 u[IU]/mL (ref 0.350–4.500)

## 2016-04-04 LAB — URINALYSIS, ROUTINE W REFLEX MICROSCOPIC
Bilirubin Urine: NEGATIVE
GLUCOSE, UA: NEGATIVE mg/dL
LEUKOCYTES UA: NEGATIVE
Nitrite: NEGATIVE
PH: 6 (ref 5.0–8.0)
PROTEIN: 30 mg/dL — AB
Specific Gravity, Urine: 1.01 (ref 1.005–1.030)

## 2016-04-04 LAB — URINE MICROSCOPIC-ADD ON

## 2016-04-04 LAB — VITAMIN B12: Vitamin B-12: 1276 pg/mL — ABNORMAL HIGH (ref 180–914)

## 2016-04-04 LAB — CBC
HCT: 25 % — ABNORMAL LOW (ref 39.0–52.0)
HEMOGLOBIN: 8.6 g/dL — AB (ref 13.0–17.0)
MCH: 29.9 pg (ref 26.0–34.0)
MCHC: 34.4 g/dL (ref 30.0–36.0)
MCV: 86.8 fL (ref 78.0–100.0)
Platelets: 182 10*3/uL (ref 150–400)
RBC: 2.88 MIL/uL — AB (ref 4.22–5.81)
RDW: 17.3 % — ABNORMAL HIGH (ref 11.5–15.5)
WBC: 3.2 10*3/uL — ABNORMAL LOW (ref 4.0–10.5)

## 2016-04-04 LAB — POC OCCULT BLOOD, ED: Fecal Occult Bld: POSITIVE — AB

## 2016-04-04 LAB — FOLATE: FOLATE: 2.6 ng/mL — AB (ref >5.9)

## 2016-04-04 LAB — RETICULOCYTES
RBC.: 3.05 MIL/uL — ABNORMAL LOW (ref 4.22–5.81)
RETIC COUNT ABSOLUTE: 39.7 10*3/uL (ref 19.0–186.0)
Retic Ct Pct: 1.3 % (ref 0.4–3.1)

## 2016-04-04 LAB — FERRITIN: Ferritin: 396 ng/mL — ABNORMAL HIGH (ref 24–336)

## 2016-04-04 MED ORDER — THIAMINE HCL 100 MG/ML IJ SOLN
100.0000 mg | Freq: Every day | INTRAMUSCULAR | Status: DC
  Administered 2016-04-04 – 2016-04-05 (×2): 100 mg via INTRAVENOUS
  Filled 2016-04-04 (×2): qty 2

## 2016-04-04 MED ORDER — ADULT MULTIVITAMIN W/MINERALS CH
1.0000 | ORAL_TABLET | Freq: Every day | ORAL | Status: DC
  Administered 2016-04-04 – 2016-04-06 (×3): 1 via ORAL
  Filled 2016-04-04 (×3): qty 1

## 2016-04-04 MED ORDER — SODIUM CHLORIDE 0.9 % IV BOLUS (SEPSIS)
1000.0000 mL | Freq: Once | INTRAVENOUS | Status: AC
Start: 2016-04-04 — End: 2016-04-04
  Administered 2016-04-04: 1000 mL via INTRAVENOUS

## 2016-04-04 MED ORDER — VITAMIN D 1000 UNITS PO TABS
2000.0000 [IU] | ORAL_TABLET | Freq: Every day | ORAL | Status: DC
  Administered 2016-04-04 – 2016-04-06 (×3): 2000 [IU] via ORAL
  Filled 2016-04-04 (×3): qty 2

## 2016-04-04 MED ORDER — POTASSIUM CHLORIDE IN NACL 20-0.9 MEQ/L-% IV SOLN
INTRAVENOUS | Status: DC
  Administered 2016-04-04 – 2016-04-05 (×2): via INTRAVENOUS

## 2016-04-04 MED ORDER — LORAZEPAM 2 MG/ML IJ SOLN
1.0000 mg | Freq: Four times a day (QID) | INTRAMUSCULAR | Status: DC | PRN
  Administered 2016-04-04 (×2): 1 mg via INTRAVENOUS
  Filled 2016-04-04 (×2): qty 1

## 2016-04-04 MED ORDER — SODIUM CHLORIDE 0.9% FLUSH
3.0000 mL | Freq: Two times a day (BID) | INTRAVENOUS | Status: DC
  Administered 2016-04-04 – 2016-04-06 (×3): 3 mL via INTRAVENOUS

## 2016-04-04 MED ORDER — DEXTROSE-NACL 5-0.9 % IV SOLN
INTRAVENOUS | Status: DC
  Administered 2016-04-04 (×2): via INTRAVENOUS

## 2016-04-04 MED ORDER — VITAMIN B-1 100 MG PO TABS
100.0000 mg | ORAL_TABLET | Freq: Every day | ORAL | Status: DC
  Administered 2016-04-06: 100 mg via ORAL
  Filled 2016-04-04: qty 1

## 2016-04-04 MED ORDER — PANTOPRAZOLE SODIUM 40 MG PO TBEC
40.0000 mg | DELAYED_RELEASE_TABLET | Freq: Every day | ORAL | Status: DC
  Administered 2016-04-04 – 2016-04-06 (×3): 40 mg via ORAL
  Filled 2016-04-04 (×3): qty 1

## 2016-04-04 MED ORDER — HEPARIN SODIUM (PORCINE) 5000 UNIT/ML IJ SOLN
5000.0000 [IU] | Freq: Three times a day (TID) | INTRAMUSCULAR | Status: DC
Start: 2016-04-04 — End: 2016-04-06
  Administered 2016-04-04 – 2016-04-06 (×5): 5000 [IU] via SUBCUTANEOUS
  Filled 2016-04-04 (×5): qty 1

## 2016-04-04 MED ORDER — FOLIC ACID 1 MG PO TABS
1.0000 mg | ORAL_TABLET | Freq: Every day | ORAL | Status: DC
  Administered 2016-04-04 – 2016-04-06 (×3): 1 mg via ORAL
  Filled 2016-04-04 (×3): qty 1

## 2016-04-04 MED ORDER — LORAZEPAM 1 MG PO TABS: 1.0000 mg | ORAL_TABLET | Freq: Four times a day (QID) | ORAL | Status: DC | PRN

## 2016-04-04 NOTE — H&P (Signed)
History and Physical    Wesley Francis [id number] DOB: 1966/06/26 DOA: 04/03/2016  Referring MD/NP/PA: Orvan Falconer, MD PCP: Everrett Coombe, NP  Outpatient Specialists: none Patient coming from: home  Chief Complaint: headache and leg pain  HPI: Wesley Francis is a 50 y.o. male with medical history significant of HTN and arthritis, chronic right leg pain, alcohol abuse (whiskey and 6 beers daily), tobacco abuse,  who presents to the ED with complaints of headache and constant, worsening, bilateral leg pain for the past 7 months. He reports that the pain radiates throughout his. His family report that the patient recently fell about a month ago because his legs "gave out" and has since then been falling more. Pt also reports associated back pain and has a hx of back problems though he has never seen a physical therapist. He was recently prescribed B12 medication where he had blood work done. He regularly takes HTN medication, ibuprofen, and Tylenol. He drinks daily and smokes 1 ppd. He denies any dysuria, abd pain, or other associated symptoms.   ED Course: While in the ED, workup showed a creatinine of 3.89 and BUN 67. His anion gap was 19, AST 113. WBC wnl. UA was unremarkable. Tox screen positive for alcohol. CT head and CT lumbar spine were unremarkable. Hospitalist was asked to refer for further management of possible AKI, given no known baseline Cr.    Review of Systems: As per HPI otherwise 10 point review of systems negative.   Past Medical History  Diagnosis Date  . Hypertension   . ETOH abuse     Past Surgical History  Procedure Laterality Date  . Knee arthrocentesis       reports that he has been smoking Cigarettes.  He has been smoking about 1.00 pack per day. He does not have any smokeless tobacco history on file. He reports that he drinks alcohol. He reports that he does not use illicit drugs.  No Known Allergies  Family History  Problem Relation Age of Onset    . Hypertension Mother   . Stroke Mother   . Hypertension Father     Prior to Admission medications   Medication Sig Start Date End Date Taking? Authorizing Provider  Cholecalciferol (VITAMIN D) 2000 units CAPS Take 1 capsule by mouth daily.   Yes Historical Provider, MD  Cyanocobalamin ([facility] B-12) 1500 MCG TBDP Take 1 tablet by mouth daily.   Yes Historical Provider, MD  lisinopril-hydrochlorothiazide (PRINZIDE,ZESTORETIC) 20-12.5 MG tablet Take 1 tablet by mouth daily.   Yes Historical Provider, MD    Physical Exam: Filed Vitals:   04/03/16 2026 04/03/16 2200 04/03/16 2300 04/04/16 0000  BP: 85/53 103/58 100/70 100/68  Pulse: 98  91 89  Temp: 98.4 F (36.9 C)     TempSrc: Oral     Resp: 14 20  15   Height: 5\' 8"  (1.727 m)     Weight: 64.411 kg (142 lb)     SpO2: 98%  96% 93%      Constitutional: NAD, calm, comfortable Filed Vitals:   04/03/16 2026 04/03/16 2200 04/03/16 2300 04/04/16 0000  BP: 85/53 103/58 100/70 100/68  Pulse: 98  91 89  Temp: 98.4 F (36.9 C)     TempSrc: Oral     Resp: 14 20  15   Height: 5\' 8"  (1.727 m)     Weight: 64.411 kg (142 lb)     SpO2: 98%  96% 93%   Eyes: PERRL, lids and conjunctivae normal ENMT: Mucous  membranes are moist. Posterior pharynx clear of any exudate or lesions.Normal dentition.  Neck: normal, supple, no masses, no thyromegaly Respiratory: clear to auscultation bilaterally, no wheezing, no crackles. Normal respiratory effort. No accessory muscle use.  Cardiovascular: Regular rate and rhythm, no murmurs / rubs / gallops. No extremity edema. 2+ pedal pulses. No carotid bruits.  Abdomen: no tenderness, no masses palpated. No hepatosplenomegaly. Bowel sounds positive.  Musculoskeletal: no clubbing / cyanosis. No joint deformity upper and lower extremities. Good ROM, no contractures. Normal muscle tone.  Skin: no rashes, lesions, ulcers. No induration Neurologic: CN 2-12 grossly intact. Sensation intact, DTR normal. Strength 5/5 in  all 4.  Psychiatric: Normal judgment and insight. Alert and oriented x 3. Normal mood.    Labs on Admission: I have personally reviewed following labs and imaging studies  CBC:  Recent Labs Lab 04/03/16 2126  WBC 4.0  NEUTROABS 2.4  HGB 9.2*  HCT 27.1*  MCV 85.8  PLT 99991111   Basic Metabolic Panel:  Recent Labs Lab 04/03/16 2126  NA 135  K 3.9  CL 92*  CO2 24  GLUCOSE 75  BUN 67*  CREATININE 3.89*  CALCIUM 9.3   Liver Function Tests:  Recent Labs Lab 04/03/16 2126  AST 113*  ALT 29  ALKPHOS 18*  BILITOT 0.7  PROT 7.4  ALBUMIN 4.4   Urine analysis:    Component Value Date/Time   COLORURINE YELLOW 04/03/2016 2305   APPEARANCEUR CLEAR 04/03/2016 2305   LABSPEC 1.020 04/03/2016 2305   PHURINE 5.5 04/03/2016 2305   GLUCOSEU NEGATIVE 04/03/2016 2305   HGBUR SMALL* 04/03/2016 2305   BILIRUBINUR SMALL* 04/03/2016 2305   KETONESUR TRACE* 04/03/2016 2305   PROTEINUR 30* 04/03/2016 2305   NITRITE NEGATIVE 04/03/2016 2305   LEUKOCYTESUR NEGATIVE 04/03/2016 2305    Radiological Exams on Admission: Ct Head Wo Contrast  04/03/2016  CLINICAL DATA:  50 year old male with constant worsening bilateral leg pain for the past 7 months. Fall 1 month ago. Chronic back pain. EXAM: CT HEAD WITHOUT CONTRAST TECHNIQUE: Contiguous axial images were obtained from the base of the skull through the vertex without intravenous contrast. COMPARISON:  Head CT 05/13/2014. FINDINGS: No acute intracranial abnormalities. Specifically, no evidence of acute intracranial hemorrhage, no definite findings of acute/subacute cerebral ischemia, no mass, mass effect, hydrocephalus or abnormal intra or extra-axial fluid collections. Visualized paranasal sinuses and mastoids are well pneumatized. No acute displaced skull fractures are identified. IMPRESSION: *No acute intracranial abnormalities. *The appearance of the brain is normal. Electronically Signed   By: Vinnie Langton M.D.   On: 04/03/2016 22:56     Ct Lumbar Spine Wo Contrast  04/03/2016  CLINICAL DATA:  Bilateral leg pain for 7 months. "Legs giving out" EXAM: CT LUMBAR SPINE WITHOUT CONTRAST TECHNIQUE: Multidetector CT imaging of the lumbar spine was performed without intravenous contrast administration. Multiplanar CT image reconstructions were also generated. COMPARISON:  Lumbar spine MRI 08/17/2012 FINDINGS: No acute fracture, subluxation, endplate erosion, or evidence of focal bone lesion. No retroperitoneal finding to explain the history. There is spondylotic endplate spurring and degenerative disc narrowing. No canal stenosis. L3-4 right far-lateral disc bulging and L3 contact is regressed from prior. Mild to moderate L5-S1 left foraminal stenosis from disc bulge, stable. IMPRESSION: 1. No acute finding. No canal stenosis to explain bilateral leg symptoms. 2. Degenerative disc disease without noted progression compared to 2013 MRI. Electronically Signed   By: Monte Fantasia M.D.   On: 04/03/2016 22:41    EKG: Independently reviewed.  Assessment/Plan Principal Problem:   AKI (acute kidney injury) ([place]) Active Problems:   Alcohol abuse   Tobacco abuse  1. Possible renal failure. Creatinine was 3.89. Will need to request blood work to determine if this is acute vs chronic. He is on ibuprofen, which will be held.  Will give fluids and hold Lisinopril. Check creatinine in am, check UA, check renal US. Will continue to monitor.   2. Alcohol abuse. Will start on CIWA protocol. 3. Tobacco abuse. Counseled on importance of cessation. Will provide nicotine patch.   DVT prophylaxis: Lovenox Code Status: Full Family Communication: Sisters Mardene Celeste and Caren Griffins present at bedside  Disposition Plan: Anticipate discharge in 2-3 days.  Consults called: none Admission status: admit as inpatient    Orvan Falconer, MD FACP [facility]  If 7PM-7AM, please contact night-coverage [email or web address] Password [email or web address]  04/04/2016, 12:15 AM   By  signing my name below, I, Delene Ruffini, attest that this documentation has been prepared under the direction and in the presence of Orvan Falconer, MD. Electronically Signed: Delene Ruffini, Scribe 04/04/2016 12:15am

## 2016-04-04 NOTE — Progress Notes (Signed)
Patient is a 50 year old man with a history of alcoholism, HTN, chronic low back pain, and chronic right leg pain, who was admitted this morning by Dr. Marin Comment for a headache and right leg pain. Patient was found to be in acute renal failure. His alcohol level was 289 on admission. Radiographic studies revealed no acute fractures. Patient was briefly seen and examined. His chart, vital signs, laboratory studies were reviewed. Discussed findings and concern for impending alcohol withdrawal syndrome with his sisters.  -Agree with management as started with additions below. -Will order an anemia panel for assessment of normocytic anemia and mild leukopenia. -Patient's stool was guaiac-positive. Will start Protonix. Patient will likely need an outpatient GI evaluation. -Since he is eating, will discontinue dextrose and IV fluids. -Continue CIWA protocol as ordered.

## 2016-04-05 ENCOUNTER — Encounter (HOSPITAL_COMMUNITY): Payer: Self-pay | Admitting: Internal Medicine

## 2016-04-05 DIAGNOSIS — R195 Other fecal abnormalities: Secondary | ICD-10-CM | POA: Diagnosis present

## 2016-04-05 DIAGNOSIS — E538 Deficiency of other specified B group vitamins: Secondary | ICD-10-CM | POA: Diagnosis present

## 2016-04-05 DIAGNOSIS — F1010 Alcohol abuse, uncomplicated: Secondary | ICD-10-CM

## 2016-04-05 DIAGNOSIS — G8929 Other chronic pain: Secondary | ICD-10-CM

## 2016-04-05 DIAGNOSIS — D649 Anemia, unspecified: Secondary | ICD-10-CM

## 2016-04-05 DIAGNOSIS — M79606 Pain in leg, unspecified: Secondary | ICD-10-CM

## 2016-04-05 DIAGNOSIS — Z720 Tobacco use: Secondary | ICD-10-CM

## 2016-04-05 DIAGNOSIS — M545 Low back pain: Secondary | ICD-10-CM

## 2016-04-05 HISTORY — DX: Other fecal abnormalities: R19.5

## 2016-04-05 HISTORY — DX: Deficiency of other specified B group vitamins: E53.8

## 2016-04-05 LAB — CBC
HCT: 25.9 % — ABNORMAL LOW (ref 39.0–52.0)
HEMOGLOBIN: 8.5 g/dL — AB (ref 13.0–17.0)
MCH: 28.9 pg (ref 26.0–34.0)
MCHC: 32.8 g/dL (ref 30.0–36.0)
MCV: 88.1 fL (ref 78.0–100.0)
Platelets: 164 10*3/uL (ref 150–400)
RBC: 2.94 MIL/uL — AB (ref 4.22–5.81)
RDW: 17.4 % — ABNORMAL HIGH (ref 11.5–15.5)
WBC: 3.8 10*3/uL — AB (ref 4.0–10.5)

## 2016-04-05 LAB — COMPREHENSIVE METABOLIC PANEL
ALT: 25 U/L (ref 17–63)
ANION GAP: 9 (ref 5–15)
AST: 67 U/L — ABNORMAL HIGH (ref 15–41)
Albumin: 3.8 g/dL (ref 3.5–5.0)
Alkaline Phosphatase: 19 U/L — ABNORMAL LOW (ref 38–126)
BUN: 42 mg/dL — ABNORMAL HIGH (ref 6–20)
CALCIUM: 8.9 mg/dL (ref 8.9–10.3)
CHLORIDE: 100 mmol/L — AB (ref 101–111)
CO2: 27 mmol/L (ref 22–32)
Creatinine, Ser: 1.58 mg/dL — ABNORMAL HIGH (ref 0.61–1.24)
GFR calc non Af Amer: 49 mL/min — ABNORMAL LOW (ref >60)
GFR, EST AFRICAN AMERICAN: 57 mL/min — AB (ref >60)
Glucose, Bld: 97 mg/dL (ref 65–99)
Potassium: 4.2 mmol/L (ref 3.5–5.1)
SODIUM: 136 mmol/L (ref 135–145)
Total Bilirubin: 1 mg/dL (ref 0.3–1.2)
Total Protein: 6.5 g/dL (ref 6.5–8.1)

## 2016-04-05 LAB — MAGNESIUM: MAGNESIUM: 1.6 mg/dL — AB (ref 1.7–2.4)

## 2016-04-05 MED ORDER — MAGNESIUM SULFATE 50 % IJ SOLN: 1.0000 g | Freq: Once | INTRAVENOUS | Status: DC

## 2016-04-05 MED ORDER — MAGNESIUM SULFATE IN D5W 1-5 GM/100ML-% IV SOLN
1.0000 g | Freq: Once | INTRAVENOUS | Status: AC
  Administered 2016-04-05: 1 g via INTRAVENOUS
  Filled 2016-04-05: qty 100

## 2016-04-05 NOTE — Progress Notes (Signed)
PROGRESS NOTE    RAKAN LIMES  [id number] DOB: [birth year] DOA: 04/03/2016 PCP: Everrett Coombe, NP  Outpatient Specialists:     Brief Narrative:   Wesley Francis is a 50 y.o. male with medical history significant of HTN and arthritis, chronic right leg pain, alcohol abuse (whiskey and 6 beers daily), tobacco abuse,who presented to the ED on 04/03/16 with complaints of headache and constant, worsening, bilateral leg pain for the past 7 months. The pain radiated to both legs. His family reported that he recently fell about a month ago because his legs "gave out" and has since then been falling more. He has a hx of back problems though he has never seen a physical therapist. He was recently prescribed vitamin B-12 supplement. He drinks daily and smokes 1 ppd. He denied any dysuria, abd pain, or other associated symptoms.  In the ED, his lab data revealed a creatinine of 3.89, BUN of 67, AST of 113, WBC within normal limits, and unremarkable urinalysis. His alcohol level was greater than 200. CT scan of the head and CT scan of the lumbar spine revealed no acute changes. He was admitted for further evaluation and management.  Assessment & Plan:   Principal Problem:   AKI (acute kidney injury) ([place]) Active Problems:   Alcohol abuse   Tobacco abuse   Normocytic anemia   Chronic low back pain   Chronic leg pain   Folate deficiency   Heme positive stool   1. Acute kidney injury. Patient's creatinine was 3.89 his BUN was 67 on admission. His baseline creatinine was unknown. However, he is treated chronically with lisinopril-HCTZ. -Lisinopril HCTZ was withheld. He was started on vigorous IV fluids. Renal ultrasound was ordered and revealed no evidence of chronic renal disease/no evidence of acute abnormalities. -With IV fluids, his creatinine progressively improved. Given the improvement off of his ACE inhibitor and diuretic and with IV fluids, it is likely that his AK I was  secondary to prerenal azotemia. -We'll continue IV fluids and current management.  Chronic low back pain/chronic leg pain with falls at home. CT of his head revealed a relatively normal exam. CT of his lumbar spine revealed no acute findings, but with some degenerative disc disease. -Patient was treated with analgesics as needed. -Etiology of his chronic musculoskeletal symptoms is unknown, but may be related to degenerative joint disease and/or alcoholic myopathy or vitamin deficiency. His folate level was low. He was started on folate supplementation. His B12 actually elevated. His TSH was within normal limits.  -Physical therapy ordered and is pending.  Hypertension. Patient is treated chronically with lisinopril/HCTZ. It was held due to acute kidney injury. His blood pressure has been stable and controlled off of this medication. We'll continue to monitor.  Alcohol abuse. The patient admittedly and per the family drinks 6 beers and whiskey daily. His alcohol level was greater than 200 on admission, but there was no mention of actual clinical intoxication. -He was started on the CIWA alcohol withdrawal protocol with Ativan and vitamin therapy. -He has not demonstrated alcohol withdrawal syndrome at this point.  Tobacco abuse. Patient was advised to stop smoking.  Normocytic anemia/folic acid deficiency. Patient's hemoglobin was 9.2 with a normal MCV on admission. With IV fluid hydration, it drifted down to 8.5. -His stool was found to be guaiac positive, but he denied gross melena or bright red blood per rectum. He denied hematemesis. He was started on Protonix empirically. -Anemia panel revealed normal total iron, normal ferritin, elevated  vitamin B12 1 (on apparent supplement) and a low folate of 2.6. He was started on folate supplement. -. Per his sister, the patient had a colonoscopy last year or year before last and revealed colon polyps. Patient does not recall an upper  endoscopy.   DVT prophylaxis: Heparin Code Status: Full code Family Communication: Discussed with sister Disposition Plan: Discharged home, possibly in the next 24 hours.   Consultants:   None  Procedures:  None  Antimicrobials:  None   Subjective: Patient has no complaints of chest pain, shortness of breath, or abdominal pain. His back and his legs feel about the same.  Objective: Filed Vitals:   04/04/16 1800 04/04/16 2205 04/05/16 0507 04/05/16 0935  BP: 156/87 129/87 132/81   Pulse: 88 70 76   Temp: 98.8 F (37.1 C) 98.5 F (36.9 C) 98.5 F (36.9 C)   TempSrc: Oral Oral Oral   Resp: 18 18 18    Height:      Weight:      SpO2: 100% 100% 96% 100%    Intake/Output Summary (Last 24 hours) at 04/05/16 1436 Last data filed at 04/05/16 0909  Gross per 24 hour  Intake 513.33 ml  Output   1401 ml  Net -887.67 ml   Filed Weights   04/03/16 2026 04/04/16 0621  Weight: 64.411 kg (142 lb) 59.92 kg (132 lb 1.6 oz)    Examination:  General exam: Appears calm and comfortable; 50 year old African-American man in no acute distress.  Respiratory system: Clear to auscultation. Respiratory effort normal. Cardiovascular system: S1 & S2 heard, RRR. No JVD, murmurs, rubs, gallops or clicks. No pedal edema. Gastrointestinal system: Abdomen is nondistended, soft and nontender. No organomegaly or masses felt. Normal bowel sounds heard. Central nervous system: Alert and oriented. Patient has good bilateral hand grip. He is able to raise each leg against gravity, but has some discomfort in his lower back. No signs of tremulousness. Cranial nerves II through XII are grossly intact. Extremities: Symmetric; no acute hot red joints. Skin: No rashes, lesions or ulcers Psychiatry: Flat affect. Speech clear. He does not give eye contact.    Data Reviewed: I have personally reviewed following labs and imaging studies  CBC:  Recent Labs Lab 04/03/16 2126 04/04/16 0544  04/05/16 0603  WBC 4.0 3.2* 3.8*  NEUTROABS 2.4  --   --   HGB 9.2* 8.6* 8.5*  HCT 27.1* 25.0* 25.9*  MCV 85.8 86.8 88.1  PLT 189 182 [id number]   Basic Metabolic Panel:  Recent Labs Lab 04/03/16 2126 04/04/16 0544 04/05/16 0603  NA 135 137 136  K 3.9 3.7 4.2  CL 92* 99* 100*  CO2 24 21* 27  GLUCOSE 75 72 97  BUN 67* 63* 42*  CREATININE 3.89* 2.79* 1.58*  CALCIUM 9.3 8.7* 8.9  MG  --   --  1.6*   GFR: Estimated Creatinine Clearance: 47.4 mL/min (by C-G formula based on Cr of 1.58). Liver Function Tests:  Recent Labs Lab 04/03/16 2126 04/05/16 0603  AST 113* 67*  ALT 29 25  ALKPHOS 18* 19*  BILITOT 0.7 1.0  PROT 7.4 6.5  ALBUMIN 4.4 3.8   No results for input(s): LIPASE, AMYLASE in the last 168 hours. No results for input(s): AMMONIA in the last 168 hours. Coagulation Profile: No results for input(s): INR, PROTIME in the last 168 hours. Cardiac Enzymes: No results for input(s): CKTOTAL, CKMB, CKMBINDEX, TROPONINI in the last 168 hours. BNP (last 3 results) No results for input(s): PROBNP in  the last 8760 hours. HbA1C: No results for input(s): HGBA1C in the last 72 hours. CBG: No results for input(s): GLUCAP in the last 168 hours. Lipid Profile: No results for input(s): CHOL, HDL, LDLCALC, TRIG, CHOLHDL, LDLDIRECT in the last 72 hours. Thyroid Function Tests:  Recent Labs  04/04/16 0544  TSH 0.924   Anemia Panel:  Recent Labs  04/04/16 0811  VITAMINB12 1276*  FOLATE 2.6*  FERRITIN 396*  TIBC 297  IRON 109  RETICCTPCT 1.3   Urine analysis:    Component Value Date/Time   COLORURINE YELLOW 04/04/2016 1500   APPEARANCEUR CLEAR 04/04/2016 1500   LABSPEC 1.010 04/04/2016 1500   PHURINE 6.0 04/04/2016 1500   GLUCOSEU NEGATIVE 04/04/2016 1500   HGBUR SMALL* 04/04/2016 1500   BILIRUBINUR NEGATIVE 04/04/2016 1500   KETONESUR TRACE* 04/04/2016 1500   PROTEINUR 30* 04/04/2016 1500   NITRITE NEGATIVE 04/04/2016 1500   LEUKOCYTESUR NEGATIVE 04/04/2016  1500   Sepsis Labs: @LABRCNTIP (procalcitonin:4,lacticidven:4)  )No results found for this or any previous visit (from the past 240 hour(s)).       Radiology Studies: Ct Head Wo Contrast  04/03/2016  CLINICAL DATA:  50 year old male with constant worsening bilateral leg pain for the past 7 months. Fall 1 month ago. Chronic back pain. EXAM: CT HEAD WITHOUT CONTRAST TECHNIQUE: Contiguous axial images were obtained from the base of the skull through the vertex without intravenous contrast. COMPARISON:  Head CT 05/13/2014. FINDINGS: No acute intracranial abnormalities. Specifically, no evidence of acute intracranial hemorrhage, no definite findings of acute/subacute cerebral ischemia, no mass, mass effect, hydrocephalus or abnormal intra or extra-axial fluid collections. Visualized paranasal sinuses and mastoids are well pneumatized. No acute displaced skull fractures are identified. IMPRESSION: *No acute intracranial abnormalities. *The appearance of the brain is normal. Electronically Signed   By: Vinnie Langton M.D.   On: 04/03/2016 22:56   Ct Lumbar Spine Wo Contrast  04/03/2016  CLINICAL DATA:  Bilateral leg pain for 7 months. "Legs giving out" EXAM: CT LUMBAR SPINE WITHOUT CONTRAST TECHNIQUE: Multidetector CT imaging of the lumbar spine was performed without intravenous contrast administration. Multiplanar CT image reconstructions were also generated. COMPARISON:  Lumbar spine MRI 08/17/2012 FINDINGS: No acute fracture, subluxation, endplate erosion, or evidence of focal bone lesion. No retroperitoneal finding to explain the history. There is spondylotic endplate spurring and degenerative disc narrowing. No canal stenosis. L3-4 right far-lateral disc bulging and L3 contact is regressed from prior. Mild to moderate L5-S1 left foraminal stenosis from disc bulge, stable. IMPRESSION: 1. No acute finding. No canal stenosis to explain bilateral leg symptoms. 2. Degenerative disc disease without noted  progression compared to 2013 MRI. Electronically Signed   By: Monte Fantasia M.D.   On: 04/03/2016 22:41   US Renal  04/04/2016  CLINICAL DATA:  Patient with acute renal failure. EXAM: RENAL / URINARY TRACT ULTRASOUND COMPLETE COMPARISON:  Abdominal ultrasound 12/12/2009 FINDINGS: Right Kidney: Length: 9.9 cm. Normal renal cortical thickness and echogenicity. There is a 1.1 x 1.0 x 0.9 cm cyst within the superior pole. Left Kidney: Length: 8.8 cm. Normal renal cortical thickness and echogenicity. Mild pelviectasis. Bladder: Appears normal for degree of bladder distention. IMPRESSION: Mild left pelviectasis.  Otherwise unremarkable renal ultrasound. Electronically Signed   By: Lovey Newcomer M.D.   On: 04/04/2016 10:38   Dg Chest Port 1 View  04/04/2016  CLINICAL DATA:  Acute kidney injury EXAM: PORTABLE CHEST 1 VIEW COMPARISON:  05/12/2010 FINDINGS: Heart size and vascular pattern are normal. Lungs are clear. Minimal  blunting left costophrenic angle. IMPRESSION: Possible nonspecific tiny left pleural effusion. Otherwise negative. Electronically Signed   By: Skipper Cliche M.D.   On: 04/04/2016 09:07        Scheduled Meds: . cholecalciferol  2,000 Units Oral Daily  . folic acid  1 mg Oral Daily  . heparin  5,000 Units Subcutaneous Q8H  . multivitamin with minerals  1 tablet Oral Daily  . pantoprazole  40 mg Oral Daily  . sodium chloride flush  3 mL Intravenous Q12H  . thiamine  100 mg Oral Daily   Or  . thiamine  100 mg Intravenous Daily   Continuous Infusions: . 0.9 % NaCl with KCl 20 mEq / L 70 mL/hr at 04/05/16 0902     LOS: 1 day    Time spent: 52 minutes    Rexene Alberts, MD [facility] Pager [phone]  If 7PM-7AM, please contact night-coverage [email or web address] Password [email or web address] 04/05/2016, 2:36 PM

## 2016-04-06 ENCOUNTER — Inpatient Hospital Stay (HOSPITAL_COMMUNITY): Payer: Medicaid Other

## 2016-04-06 DIAGNOSIS — F10129 Alcohol abuse with intoxication, unspecified: Secondary | ICD-10-CM

## 2016-04-06 DIAGNOSIS — R0989 Other specified symptoms and signs involving the circulatory and respiratory systems: Secondary | ICD-10-CM | POA: Insufficient documentation

## 2016-04-06 DIAGNOSIS — F10929 Alcohol use, unspecified with intoxication, unspecified: Secondary | ICD-10-CM | POA: Insufficient documentation

## 2016-04-06 LAB — CBC
HEMATOCRIT: 24.7 % — AB (ref 39.0–52.0)
HEMOGLOBIN: 8.2 g/dL — AB (ref 13.0–17.0)
MCH: 28.9 pg (ref 26.0–34.0)
MCHC: 33.2 g/dL (ref 30.0–36.0)
MCV: 87 fL (ref 78.0–100.0)
Platelets: 152 10*3/uL (ref 150–400)
RBC: 2.84 MIL/uL — ABNORMAL LOW (ref 4.22–5.81)
RDW: 16.9 % — ABNORMAL HIGH (ref 11.5–15.5)
WBC: 4.2 10*3/uL (ref 4.0–10.5)

## 2016-04-06 LAB — BASIC METABOLIC PANEL
ANION GAP: 9 (ref 5–15)
BUN: 22 mg/dL — ABNORMAL HIGH (ref 6–20)
CALCIUM: 8.9 mg/dL (ref 8.9–10.3)
CHLORIDE: 99 mmol/L — AB (ref 101–111)
CO2: 27 mmol/L (ref 22–32)
CREATININE: 1.13 mg/dL (ref 0.61–1.24)
GFR calc non Af Amer: >60 mL/min (ref >60)
Glucose, Bld: 102 mg/dL — ABNORMAL HIGH (ref 65–99)
Potassium: 3.6 mmol/L (ref 3.5–5.1)
SODIUM: 135 mmol/L (ref 135–145)

## 2016-04-06 MED ORDER — LISINOPRIL-HYDROCHLOROTHIAZIDE 20-12.5 MG PO TABS: ORAL_TABLET | ORAL | Status: DC

## 2016-04-06 MED ORDER — ALBUTEROL SULFATE (2.5 MG/3ML) 0.083% IN NEBU: 2.5000 mg | INHALATION_SOLUTION | RESPIRATORY_TRACT | Status: DC | PRN

## 2016-04-06 MED ORDER — FOLIC ACID 1 MG PO TABS: 1.0000 mg | ORAL_TABLET | Freq: Every day | ORAL | Status: DC

## 2016-04-06 MED ORDER — ALBUTEROL SULFATE (2.5 MG/3ML) 0.083% IN NEBU
2.5000 mg | INHALATION_SOLUTION | Freq: Four times a day (QID) | RESPIRATORY_TRACT | Status: DC
  Administered 2016-04-06: 2.5 mg via RESPIRATORY_TRACT
  Filled 2016-04-06: qty 3

## 2016-04-06 MED ORDER — THIAMINE HCL 100 MG PO TABS: 100.0000 mg | ORAL_TABLET | Freq: Every day | ORAL | Status: DC

## 2016-04-06 MED ORDER — PANTOPRAZOLE SODIUM 40 MG PO TBEC: 40.0000 mg | DELAYED_RELEASE_TABLET | Freq: Every day | ORAL | Status: DC

## 2016-04-06 MED ORDER — ALBUTEROL SULFATE HFA 108 (90 BASE) MCG/ACT IN AERS: 2.0000 | INHALATION_SPRAY | Freq: Four times a day (QID) | RESPIRATORY_TRACT | Status: DC | PRN

## 2016-04-06 NOTE — Progress Notes (Signed)
Starla Link discharged Home per MD order.  Discharge instructions reviewed and discussed with the patient and sister, all questions and concerns answered. Copy of instructions and scripts given to patient. Prescriptions also faxed to [facility] in [place].    Medication List    TAKE these medications        albuterol 108 (90 Base) MCG/ACT inhaler  Commonly known as:  PROVENTIL HFA;VENTOLIN HFA  Inhale 2 puffs into the lungs every 6 (six) hours as needed for wheezing or shortness of breath.     [employer] B-12 1500 MCG Tbdp  Generic drug:  Cyanocobalamin  Take 1 tablet by mouth daily.     folic acid 1 MG tablet  Commonly known as:  FOLVITE  Take 1 tablet (1 mg total) by mouth daily.     lisinopril-hydrochlorothiazide 20-12.5 MG tablet  Commonly known as:  PRINZIDE,ZESTORETIC  Starting on 04/07/16, take 1 pill EVERY OTHER DAY until further evaluation and changes by your primary care provider.     pantoprazole 40 MG tablet  Commonly known as:  PROTONIX  Take 1 tablet (40 mg total) by mouth daily. For possible gastritis.     thiamine 100 MG tablet  Take 1 tablet (100 mg total) by mouth daily.     Vitamin D 2000 units Caps  Take 1 capsule by mouth daily.        Patients skin is clean, dry and intact, no evidence of skin break down. IV site discontinued and catheter remains intact. Site without signs and symptoms of complications. Dressing and pressure applied.  Patient escorted to car by NT in a wheelchair,  no distress noted upon discharge.  Ralene Muskrat Suleyma Wafer 04/06/2016 3:59 PM

## 2016-04-06 NOTE — Evaluation (Signed)
Physical Therapy Evaluation Patient Details Name: Wesley Francis MRN: [id number] DOB: 11-30-1966 Today's Date: 04/06/2016   History of Present Illness  Wesley Francis is a 50 y.o. male with medical history significant of HTN and arthritis, chronic right leg pain, alcohol abuse (whiskey and 6 beers daily), tobacco abuse, who presents to the ED with complaints of headache and constant, worsening, bilateral leg pain for the past 7 months. He reports that the pain radiates throughout his. His family report that the patient recently fell about a month ago because his legs "gave out" and has since then been falling more. Pt also reports associated back pain and has a hx of back problems though he has never seen a physical therapist. He was recently prescribed B12 medication where he had blood work done. He regularly takes HTN medication, ibuprofen, and Tylenol. He drinks daily and smokes 1 ppd. He denies any dysuria, abd pain, or other associated symptoms. Dx: possible renal failure.   Clinical Impression  Pt received in bed, and was agreeable to PT evaluation.  Pt's sister, Sharl Ma, also present.  Pt ambulated 427ft with no AD, but demonstrated at least 3 LOB which required Min A to prevent fall.  Pt expressed that when he has fallen at home it is when he gets up too quickly.  Therefore, after ambulation, pt returned to supine in bed, and orthostatic vitals were obtained, and found to be as follows:  Supine BP: 144/93, HR: 75bpm Sitting BP: 135/89, HR: 113 Standing BP: 121/92, HR: 122 Gave these results to RN who stated that telemetry had contacted her while pt was participating in PT, and stated that his HR had become tachycardic up into the 140's.  Pt is recommended for continued skilled PT to optimize balance, and strength.  Continue to recommend 24/7 supervision at home, as well as HHPT upon d/c.     Follow Up Recommendations Home health PT    Equipment Recommendations  Rolling walker with 5"  wheels    Recommendations for Other Services       Precautions / Restrictions Precautions Precautions: Fall Precaution Comments: Pt has had at least 2 falls that his sister, Sharl Ma is aware of at home.  Pt states that he fell when he was trying to get up, and he would get really woozy "like your blood isn't pumping."  Restrictions Weight Bearing Restrictions: No      Mobility  Bed Mobility Overal bed mobility: Modified Independent                Transfers Overall transfer level: Needs assistance Equipment used: None Transfers: Sit to/from Stand Sit to Stand: Supervision;Min guard            Ambulation/Gait Ambulation/Gait assistance: Min assist Ambulation Distance (Feet): 400 Feet Assistive device: None Gait Pattern/deviations: Step-through pattern;Drifts right/left     General Gait Details: Pt demonstrates external rotation of R LE - pt states that from when his leg was crushed.  Pt demonstrates staggared gait with at least 3 LOB where PT had to provide Min A to prevent pt from falling.  Pt states this is his normal gait.   Stairs            Wheelchair Mobility    Modified Rankin (Stroke Patients Only)       Balance Overall balance assessment: Needs assistance         Standing balance support: No upper extremity supported Standing balance-Leahy Scale: Fair  High Level Balance Comments: Did not formally assess due to LOB with fwd ambulation.              Pertinent Vitals/Pain Pain Assessment: No/denies pain    Home Living Family/patient expects to be discharged to:: Private residence Living Arrangements: Other relatives (Pt's sister, Sharl Ma states that she lives with him ) Available Help at Discharge: Family;Available 24 hours/day;Personal care attendant (Patrice states that there is someone there all the time. ) Type of Home: House Home Access: Level entry     Home Layout: One level Home Equipment: Cane -  single point      Prior Function Level of Independence: Needs assistance   Gait / Transfers Assistance Needed: Pt normally ambulates without any AD, but holds on to things.   ADL's / Homemaking Assistance Needed: Pt requires assistance from his aid for dressing and bathing.         Hand Dominance        Extremity/Trunk Assessment   Upper Extremity Assessment: Overall WFL for tasks assessed           Lower Extremity Assessment: Generalized weakness;RLE deficits/detail         Communication   Communication: No difficulties  Cognition Arousal/Alertness: Awake/alert Behavior During Therapy: WFL for tasks assessed/performed Overall Cognitive Status: Within Functional Limits for tasks assessed                      General Comments      Exercises        Assessment/Plan    PT Assessment Patient needs continued PT services  PT Diagnosis Difficulty walking   PT Problem List Decreased strength;Decreased activity tolerance;Decreased balance;Decreased mobility;Decreased knowledge of use of DME;Decreased safety awareness;Decreased knowledge of precautions;Cardiopulmonary status limiting activity  PT Treatment Interventions Gait training;Functional mobility training;DME instruction;Balance training;Therapeutic activities;Therapeutic exercise;Patient/family education   PT Goals (Current goals can be found in the Care Plan section) Acute Rehab PT Goals Patient Stated Goal: Pt wants to get better.  PT Goal Formulation: With patient/family Time For Goal Achievement: 04/13/16 Potential to Achieve Goals: Fair    Frequency Min 3X/week   Barriers to discharge        Co-evaluation               End of Session Equipment Utilized During Treatment: Gait belt Activity Tolerance: Patient tolerated treatment well Patient left: in bed;with family/visitor present;with call bell/phone within reach;with bed alarm set Nurse Communication: Mobility status     Functional Assessment Tool Used: Clinical Judgement Functional Limitation: Mobility: Walking and moving around Mobility: Walking and Moving Around Current Status [id number]): At least 20 percent but less than 40 percent impaired, limited or restricted Mobility: Walking and Moving Around Goal Status [id number]): At least 1 percent but less than 20 percent impaired, limited or restricted    Time: 1037-1100 PT Time Calculation (min) (ACUTE ONLY): 23 min   Charges:   PT Evaluation $PT Eval Moderate Complexity: 1 Procedure PT Treatments $Therapeutic Activity: 8-22 mins   PT G Codes:   PT G-Codes **NOT FOR INPATIENT CLASS** Functional Assessment Tool Used: Clinical Judgement Functional Limitation: Mobility: Walking and moving around Mobility: Walking and Moving Around Current Status [id number]): At least 20 percent but less than 40 percent impaired, limited or restricted Mobility: Walking and Moving Around Goal Status [phone]): At least 1 percent but less than 20 percent impaired, limited or restricted    Tacy Learn, PT, DPT X: [id number]   04/06/2016, 12:33 PM

## 2016-04-06 NOTE — Care Management Note (Signed)
Case Management Note  Patient Details  Name: Wesley Francis MRN: [id number] Date of Birth: 1966/10/07  Subjective/Objective: Spoke with patient and family (sister Mardene Celeste) for discharge plan. Patient is from home with 24 hours supervisions by the family who Mardene Celeste stated takes turns staying with the patient.  Patient does not have a serviceable walker at home and order will be placed for this DME. Denies any issues with medications and PCP is Dr Claiborne Billings in [facility].  Sister stated that she is POA and requested that I check to see if this in medical record. Patient does not drive and only leaves the home for MD appointments. Anticipating that the patient will be discharged with Home Health. Family requests HH of [place].                   Action/Plan:  Home with home health. PT.   Expected Discharge Date:                  Expected Discharge Plan:  [place]  In-House Referral:     Discharge planning Services  CM Consult  Post Acute Care Choice:    Choice offered to:  Sibling, Patient  DME Arranged:  [facility] DME Agency:  [place]:  PT [place] Agency:  [facility]  Status of Service:  In process, will continue to follow  Medicare Important Message Given:    Date Medicare IM Given:    Medicare IM give by:    Date Additional Medicare IM Given:    Additional Medicare Important Message give by:     If discussed at [place] of Stay Meetings, dates discussed:    Additional Comments:  Alvie Heidelberg, RN 04/06/2016, 11:32 AM

## 2016-04-06 NOTE — Care Management (Signed)
Referral faxed to [place].  DME wlker will be provided to patient by [facility] rpior to discharge.

## 2016-04-06 NOTE — Discharge Summary (Signed)
Physician Discharge Summary  Wesley Francis [id number] DOB: [birth year] DOA: 04/03/2016  PCP: Everrett Coombe, NP  Admit date: 04/03/2016 Discharge date: 04/06/2016  Time spent: Greater than 30 minutes  Recommendations for Outpatient Follow-up:  1. Recommend follow-up evaluation of patient CBC as his hemoglobin was 8.2 at the time of discharge. 2. Recommend follow-up with the patient's gastroenterologist for heme positive stool.  3. Home health physical therapy and rolling walker ordered.    Discharge Diagnoses:  1. Acute kidney injury, secondary to prerenal azotemia. 2. Chronic low back pain and chronic leg pain with falls at home. -Etiology could be alcohol mediated; not clinically determined. 3. Alcohol abuse with uncomplicated intoxication. Patient was advised to stop drinking indefinitely. 4. Tobacco abuse. Patient was advised to stop smoking. 5. Hypertension.  6. Normocytic anemia with heme positive stool. 7. Possible alcoholic gastritis, but not clinically determined. 8. Reported history of colon polyps. 9. Folic acid deficiency. 10. Mild chest congestion. Follow-up chest x-ray was clear.   Discharge Condition: Improved.  Diet recommendation: Heart healthy.  Filed Weights   04/03/16 2026 04/04/16 0621  Weight: 64.411 kg (142 lb) 59.92 kg (132 lb 1.6 oz)    History of present illness:  Wesley Francis is a 50 y.o. male with medical history significant of HTN and arthritis, chronic right leg pain, alcohol abuse (whiskey and 6 beers daily), tobacco abuse,who presented to the ED on 04/03/16 with complaints of headache and constant, worsening, bilateral leg pain for the past 7 months. The pain radiated to both legs. His family reported that he recently fell about a month ago because his legs "gave out" and has since then been falling more. He has a hx of back problems though he has never seen a physical therapist. He was recently prescribed vitamin B-12 supplement. He  drinks daily and smokes 1 ppd. He denied any dysuria, abd pain, or other associated symptoms.  In the ED, his lab data revealed a creatinine of 3.89, BUN of 67, AST of 113, WBC within normal limits, and unremarkable urinalysis. His alcohol level was greater than 200. CT scan of the head and CT scan of the lumbar spine revealed no acute changes. He was admitted for further evaluation and management.  Hospital Course:  1. Acute kidney injury. Patient's creatinine was 3.89 his BUN was 67 on admission. His baseline creatinine was unknown. However, he is treated chronically with lisinopril-HCTZ. -Lisinopril HCTZ was withheld. He was started on vigorous IV fluids. Renal ultrasound was ordered and revealed no evidence of chronic renal disease/no evidence of acute abnormalities. -With IV fluids, his creatinine progressively improved. It was 1.13 at the time of discharge. Given the improvement off of his ACE inhibitor and diuretic and with IV fluids,  his AK I was secondary to prerenal azotemia.  Chronic low back pain/chronic leg pain with falls at home. CT of his head revealed a relatively normal exam. CT of his lumbar spine revealed no acute findings, but with some degenerative disc disease. -Patient was treated with analgesics as needed. -Etiology of his chronic musculoskeletal symptoms is unknown, but could be related to degenerative joint disease and/or alcoholic myopathy or vitamin deficiency. His folate level was low. He was started on folate supplementation. His B12 was actually elevated on supplementation. His TSH was within normal limits.  -Physical therapist evaluated the patient and recommended a rolling walker and home health PT. They were ordered at discharge.  Hypertension. Patient is treated chronically with lisinopril/HCTZ. It was held due to acute  kidney injury. His blood pressure was stable and controlled off of this medication, but was trending up. -Patient was instructed to restart  lisinopril at one tablet every other day. Further management will be deferred to his PCP.  Alcohol abuse. The patient admittedly and per the family drinks 6 beers and whiskey daily. His alcohol level was greater than 200 on admission. -He was started on the CIWA alcohol withdrawal protocol with Ativan and vitamin therapy. -He demonstrated no alcohol withdrawal syndrome or delirium during the hospital course.  Tobacco abuse. Patient was advised to stop smoking.  Normocytic anemia/folic acid deficiency. Patient's hemoglobin was 9.2 with a normal MCV on admission. With IV fluid hydration, it drifted down to 8.2. -His stool was found to be guaiac positive, but he denied gross melena or bright red blood per rectum. He denied hematemesis. He was started on Protonix empirically. -Anemia panel revealed normal total iron, normal ferritin, elevated vitamin B12  (he is on a vitamin B12 supplement) and a low folate of 2.6. He was started on folate supplement. -. Per his sister, the patient had a colonoscopy last year or year before last and it revealed colon polyps. Patient does not recall an having upper endoscopy. -Etiology of his anemia is likely in part folic acid deficiency, but could be alcohol mediated bone marrow suppression or subclinical alcoholic gastritis. He was discharged on Protonix daily. -Patient was instructed to follow-up with his gastroenterologist in [facility] for further evaluation.  Procedures:  None.  Consultations:  None.  Discharge Exam: Filed Vitals:   04/05/16 2141 04/06/16 1325  BP: 130/88 124/84  Pulse: 90 84  Temp: 98.8 F (37.1 C) 98.3 F (36.8 C)  Resp: 18 16  Oxygen saturation 98% on room air.   General: 50 year old African-American man in no acute distress. Cardiovascular: S1, S2, no murmurs rubs or gallops. Respiratory: upper airway crackles; partially cleared with coughing. Abdomen: Positive bowel sounds, soft, nontender,  nondistended. Neurologic/psychiatric: Flat affect. No signs of tremulousness. Alert and oriented 3.  Discharge Instructions   Discharge Instructions    Diet - low sodium heart healthy    Complete by:  As directed      Discharge instructions    Complete by:  As directed   You were admitted for acute kidney injury, dehydration, and folic acid deficiency. Alcohol use caused these problems. Avoid drinking alcohol. Take vitamins and other medications as prescribed. Also try to stop smoking. Your hemoglobin (blood count) fell to 8.2. This should be discussed with your primary provider or gastroenterologist for further evaluation. He will need a follow-up lab draw to recheck your blood count in 1-2 weeks. You were prescribed Protonix which helps to treat alcoholic gastritis.     Increase activity slowly    Complete by:  As directed           Current Discharge Medication List    START taking these medications   Details  albuterol (PROVENTIL HFA;VENTOLIN HFA) 108 (90 Base) MCG/ACT inhaler Inhale 2 puffs into the lungs every 6 (six) hours as needed for wheezing or shortness of breath. Qty: 1 Inhaler, Refills: 2    folic acid (FOLVITE) 1 MG tablet Take 1 tablet (1 mg total) by mouth daily.    pantoprazole (PROTONIX) 40 MG tablet Take 1 tablet (40 mg total) by mouth daily. For possible gastritis. Qty: 40 tablet, Refills: 3    thiamine 100 MG tablet Take 1 tablet (100 mg total) by mouth daily.      CONTINUE these  medications which have CHANGED   Details  lisinopril-hydrochlorothiazide (PRINZIDE,ZESTORETIC) 20-12.5 MG tablet Starting on 04/07/16, take 1 pill EVERY OTHER DAY until further evaluation and changes by your primary care provider.      CONTINUE these medications which have NOT CHANGED   Details  Cholecalciferol (VITAMIN D) 2000 units CAPS Take 1 capsule by mouth daily.    Cyanocobalamin ([employer] B-12) 1500 MCG TBDP Take 1 tablet by mouth daily.       No Known Allergies Follow-up  Information    Schedule an appointment as soon as possible for a visit with [place].   Why:  Follow-up in 1-2 weeks.   Contact information:   [address] [place] [address] [phone]        The results of significant diagnostics from this hospitalization (including imaging, microbiology, ancillary and laboratory) are listed below for reference.    Significant Diagnostic Studies: Dg Chest 2 View  04/06/2016  CLINICAL DATA:  Chest congestion EXAM: CHEST  2 VIEW COMPARISON:  04/04/2016 FINDINGS: The heart size and mediastinal contours are within normal limits. Both lungs are clear. The visualized skeletal structures are unremarkable. IMPRESSION: No active cardiopulmonary disease. Electronically Signed   By: Inez Catalina M.D.   On: 04/06/2016 14:43   Ct Head Wo Contrast  04/03/2016  CLINICAL DATA:  50 year old male with constant worsening bilateral leg pain for the past 7 months. Fall 1 month ago. Chronic back pain. EXAM: CT HEAD WITHOUT CONTRAST TECHNIQUE: Contiguous axial images were obtained from the base of the skull through the vertex without intravenous contrast. COMPARISON:  Head CT 05/13/2014. FINDINGS: No acute intracranial abnormalities. Specifically, no evidence of acute intracranial hemorrhage, no definite findings of acute/subacute cerebral ischemia, no mass, mass effect, hydrocephalus or abnormal intra or extra-axial fluid collections. Visualized paranasal sinuses and mastoids are well pneumatized. No acute displaced skull fractures are identified. IMPRESSION: *No acute intracranial abnormalities. *The appearance of the brain is normal. Electronically Signed   By: Vinnie Langton M.D.   On: 04/03/2016 22:56   Ct Lumbar Spine Wo Contrast  04/03/2016  CLINICAL DATA:  Bilateral leg pain for 7 months. "Legs giving out" EXAM: CT LUMBAR SPINE WITHOUT CONTRAST TECHNIQUE: Multidetector CT imaging of the lumbar spine was performed without  intravenous contrast administration. Multiplanar CT image reconstructions were also generated. COMPARISON:  Lumbar spine MRI 08/17/2012 FINDINGS: No acute fracture, subluxation, endplate erosion, or evidence of focal bone lesion. No retroperitoneal finding to explain the history. There is spondylotic endplate spurring and degenerative disc narrowing. No canal stenosis. L3-4 right far-lateral disc bulging and L3 contact is regressed from prior. Mild to moderate L5-S1 left foraminal stenosis from disc bulge, stable. IMPRESSION: 1. No acute finding. No canal stenosis to explain bilateral leg symptoms. 2. Degenerative disc disease without noted progression compared to 2013 MRI. Electronically Signed   By: Monte Fantasia M.D.   On: 04/03/2016 22:41   US Renal  04/04/2016  CLINICAL DATA:  Patient with acute renal failure. EXAM: RENAL / URINARY TRACT ULTRASOUND COMPLETE COMPARISON:  Abdominal ultrasound 12/12/2009 FINDINGS: Right Kidney: Length: 9.9 cm. Normal renal cortical thickness and echogenicity. There is a 1.1 x 1.0 x 0.9 cm cyst within the superior pole. Left Kidney: Length: 8.8 cm. Normal renal cortical thickness and echogenicity. Mild pelviectasis. Bladder: Appears normal for degree of bladder distention. IMPRESSION: Mild left pelviectasis.  Otherwise unremarkable renal ultrasound. Electronically Signed   By: Lovey Newcomer M.D.   On: 04/04/2016 10:38  Dg Chest Port 1 View  04/04/2016  CLINICAL DATA:  Acute kidney injury EXAM: PORTABLE CHEST 1 VIEW COMPARISON:  05/12/2010 FINDINGS: Heart size and vascular pattern are normal. Lungs are clear. Minimal blunting left costophrenic angle. IMPRESSION: Possible nonspecific tiny left pleural effusion. Otherwise negative. Electronically Signed   By: Skipper Cliche M.D.   On: 04/04/2016 09:07    Microbiology: No results found for this or any previous visit (from the past 240 hour(s)).   Labs: Basic Metabolic Panel:  Recent Labs Lab 04/03/16 2126  04/04/16 0544 04/05/16 0603 04/06/16 0424  NA 135 137 136 135  K 3.9 3.7 4.2 3.6  CL 92* 99* 100* 99*  CO2 24 21* 27 27  GLUCOSE 75 72 97 102*  BUN 67* 63* 42* 22*  CREATININE 3.89* 2.79* 1.58* 1.13  CALCIUM 9.3 8.7* 8.9 8.9  MG  --   --  1.6*  --    Liver Function Tests:  Recent Labs Lab 04/03/16 2126 04/05/16 0603  AST 113* 67*  ALT 29 25  ALKPHOS 18* 19*  BILITOT 0.7 1.0  PROT 7.4 6.5  ALBUMIN 4.4 3.8   No results for input(s): LIPASE, AMYLASE in the last 168 hours. No results for input(s): AMMONIA in the last 168 hours. CBC:  Recent Labs Lab 04/03/16 2126 04/04/16 0544 04/05/16 0603 04/06/16 0424  WBC 4.0 3.2* 3.8* 4.2  NEUTROABS 2.4  --   --   --   HGB 9.2* 8.6* 8.5* 8.2*  HCT 27.1* 25.0* 25.9* 24.7*  MCV 85.8 86.8 88.1 87.0  PLT 189 182 164 152   Cardiac Enzymes: No results for input(s): CKTOTAL, CKMB, CKMBINDEX, TROPONINI in the last 168 hours. BNP: BNP (last 3 results) No results for input(s): BNP in the last 8760 hours.  ProBNP (last 3 results) No results for input(s): PROBNP in the last 8760 hours.  CBG: No results for input(s): GLUCAP in the last 168 hours.     Signed:  Lakevia Perris MD.  [facility] 04/06/2016, 3:33 PM

## 2017-02-25 ENCOUNTER — Encounter: Payer: Self-pay | Admitting: *Deleted

## 2017-02-26 ENCOUNTER — Ambulatory Visit: Payer: Medicaid Other | Admitting: Anesthesiology

## 2017-02-26 ENCOUNTER — Ambulatory Visit
Admission: RE | Admit: 2017-02-26 | Discharge: 2017-02-26 | Disposition: A | Discharge status: Home / Self Care | Payer: Medicaid Other | Source: Ambulatory Visit | Attending: Gastroenterology | Admitting: Gastroenterology

## 2017-02-26 ENCOUNTER — Encounter
Admission: RE | Disposition: A | Discharge status: Home / Self Care | Payer: Self-pay | Source: Ambulatory Visit | Attending: Gastroenterology

## 2017-02-26 ENCOUNTER — Encounter: Payer: Self-pay | Admitting: *Deleted

## 2017-02-26 DIAGNOSIS — R195 Other fecal abnormalities: Secondary | ICD-10-CM | POA: Insufficient documentation

## 2017-02-26 DIAGNOSIS — Z5309 Procedure and treatment not carried out because of other contraindication: Secondary | ICD-10-CM | POA: Insufficient documentation

## 2017-02-26 HISTORY — DX: Unspecified asthma, uncomplicated: J45.909

## 2017-02-26 HISTORY — DX: Anemia, unspecified: D64.9

## 2017-02-26 HISTORY — DX: Unspecified osteoarthritis, unspecified site: M19.90

## 2017-02-26 SURGERY — COLONOSCOPY WITH PROPOFOL
Anesthesia: General

## 2017-02-26 NOTE — Anesthesia Preprocedure Evaluation (Deleted)
Anesthesia Evaluation  Patient identified by MRN, date of birth, ID band Patient awake    Reviewed: Allergy & Precautions, H&P , NPO status , Patient's Chart, lab work & pertinent test results, reviewed documented beta blocker date and time   Airway Mallampati: II   Neck ROM: full    Dental  (+) Poor Dentition   Pulmonary neg pulmonary ROS, neg shortness of breath, asthma , Current Smoker,    Pulmonary exam normal        Cardiovascular hypertension, negative cardio ROS Normal cardiovascular exam Rhythm:regular Rate:Normal     Neuro/Psych negative neurological ROS  negative psych ROS   GI/Hepatic negative GI ROS, Neg liver ROS,   Endo/Other  negative endocrine ROS  Renal/GU CRFRenal diseasenegative Renal ROS  negative genitourinary   Musculoskeletal   Abdominal   Peds  Hematology negative hematology ROS (+) anemia ,   Anesthesia Other Findings Past Medical History: 04/04/2016: AKI (acute kidney injury) ([place]) No date: Anemia No date: Arthritis No date: Asthma No date: ETOH abuse 04/05/2016: Folate deficiency 04/05/2016: Heme positive stool No date: Hypertension Past Surgical History: No date: COLONOSCOPY No date: FRACTURE SURGERY No date: KNEE ARTHROCENTESIS BMI    Body Mass Index:  24.80 kg/m     Reproductive/Obstetrics negative OB ROS                             Anesthesia Physical Anesthesia Plan  ASA: III  Anesthesia Plan: General   Post-op Pain Management:    Induction:   Airway Management Planned:   Additional Equipment:   Intra-op Plan:   Post-operative Plan:   Informed Consent: I have reviewed the patients History and Physical, chart, labs and discussed the procedure including the risks, benefits and alternatives for the proposed anesthesia with the patient or authorized representative who has indicated his/her understanding and acceptance.   Dental Advisory  Given  Plan Discussed with: CRNA  Anesthesia Plan Comments:         Anesthesia Quick Evaluation

## 2017-02-26 NOTE — Progress Notes (Signed)
Patient ate salmon cakes yesterday around 12 noon and then took the prep for colonoscopy. Dr. Gustavo Lah notified and he talked with patient and examined him. Procedure rescheduled and sister notified. Patient discharged.

## 2017-03-01 ENCOUNTER — Encounter: Payer: Self-pay | Admitting: *Deleted

## 2017-03-02 ENCOUNTER — Encounter: Payer: Self-pay | Admitting: *Deleted

## 2017-03-02 ENCOUNTER — Encounter
Admission: RE | Disposition: A | Discharge status: Home / Self Care | Payer: Self-pay | Source: Ambulatory Visit | Attending: Gastroenterology

## 2017-03-02 ENCOUNTER — Ambulatory Visit: Payer: Medicaid Other | Admitting: Anesthesiology

## 2017-03-02 ENCOUNTER — Ambulatory Visit
Admission: RE | Admit: 2017-03-02 | Discharge: 2017-03-02 | Disposition: A | Discharge status: Home / Self Care | Payer: Medicaid Other | Source: Ambulatory Visit | Attending: Gastroenterology | Admitting: Gastroenterology

## 2017-03-02 DIAGNOSIS — I10 Essential (primary) hypertension: Secondary | ICD-10-CM | POA: Insufficient documentation

## 2017-03-02 DIAGNOSIS — J449 Chronic obstructive pulmonary disease, unspecified: Secondary | ICD-10-CM | POA: Diagnosis not present

## 2017-03-02 DIAGNOSIS — F17200 Nicotine dependence, unspecified, uncomplicated: Secondary | ICD-10-CM | POA: Diagnosis not present

## 2017-03-02 DIAGNOSIS — M1990 Unspecified osteoarthritis, unspecified site: Secondary | ICD-10-CM | POA: Diagnosis not present

## 2017-03-02 DIAGNOSIS — N179 Acute kidney failure, unspecified: Secondary | ICD-10-CM | POA: Diagnosis not present

## 2017-03-02 DIAGNOSIS — Z79899 Other long term (current) drug therapy: Secondary | ICD-10-CM | POA: Diagnosis not present

## 2017-03-02 DIAGNOSIS — K640 First degree hemorrhoids: Secondary | ICD-10-CM | POA: Diagnosis not present

## 2017-03-02 DIAGNOSIS — R195 Other fecal abnormalities: Secondary | ICD-10-CM | POA: Insufficient documentation

## 2017-03-02 DIAGNOSIS — E538 Deficiency of other specified B group vitamins: Secondary | ICD-10-CM | POA: Diagnosis not present

## 2017-03-02 DIAGNOSIS — D649 Anemia, unspecified: Secondary | ICD-10-CM | POA: Insufficient documentation

## 2017-03-02 HISTORY — PX: COLONOSCOPY WITH PROPOFOL: SHX5780

## 2017-03-02 SURGERY — COLONOSCOPY WITH PROPOFOL
Anesthesia: General

## 2017-03-02 MED ORDER — LIDOCAINE HCL 2 % EX GEL
CUTANEOUS | Status: AC
  Filled 2017-03-02: qty 5

## 2017-03-02 MED ORDER — PROPOFOL 500 MG/50ML IV EMUL
INTRAVENOUS | Status: AC
  Filled 2017-03-02: qty 50

## 2017-03-02 MED ORDER — SODIUM CHLORIDE 0.9 % IV SOLN
INTRAVENOUS | Status: DC
  Administered 2017-03-02: 14:00:00 via INTRAVENOUS

## 2017-03-02 MED ORDER — SODIUM CHLORIDE 0.9 % IV SOLN
INTRAVENOUS | Status: DC
  Administered 2017-03-02: 13:00:00 via INTRAVENOUS

## 2017-03-02 MED ORDER — MIDAZOLAM HCL 2 MG/2ML IJ SOLN
INTRAMUSCULAR | Status: AC
  Filled 2017-03-02: qty 2

## 2017-03-02 MED ORDER — MIDAZOLAM HCL 5 MG/5ML IJ SOLN
INTRAMUSCULAR | Status: DC | PRN
  Administered 2017-03-02: 1 mg via INTRAVENOUS

## 2017-03-02 MED ORDER — LIDOCAINE 2% (20 MG/ML) 5 ML SYRINGE
INTRAMUSCULAR | Status: DC | PRN
  Administered 2017-03-02: 40 mg via INTRAVENOUS

## 2017-03-02 MED ORDER — PROPOFOL 10 MG/ML IV BOLUS
INTRAVENOUS | Status: DC | PRN
  Administered 2017-03-02: 100 mg via INTRAVENOUS

## 2017-03-02 MED ORDER — FENTANYL CITRATE (PF) 100 MCG/2ML IJ SOLN
INTRAMUSCULAR | Status: AC
  Filled 2017-03-02: qty 2

## 2017-03-02 MED ORDER — PROPOFOL 500 MG/50ML IV EMUL
INTRAVENOUS | Status: DC | PRN
  Administered 2017-03-02: 140 ug/kg/min via INTRAVENOUS

## 2017-03-02 MED ORDER — FENTANYL CITRATE (PF) 100 MCG/2ML IJ SOLN
INTRAMUSCULAR | Status: DC | PRN
  Administered 2017-03-02: 50 ug via INTRAVENOUS

## 2017-03-02 NOTE — H&P (Signed)
Outpatient short stay form Pre-procedure 03/02/2017 12:36 PM Lollie Sails MD  Primary Physician: Alonza Smoker, NP  Reason for visit:  Colonoscopy  History of present illness:  Patient is a 51 year old male presenting today as above. He was found on screening to have a positive Hemoccult test. He has had a colonoscopy in the past as many years ago. He denies use of any aspirin or blood thinning agents. He tolerated his prep well.    Current Facility-Administered Medications:  .  0.9 %  sodium chloride infusion, , Intravenous, Continuous, Lollie Sails, MD  Prescriptions Prior to Admission  Medication Sig Dispense Refill Last Dose  . acetaminophen (TYLENOL) 325 MG tablet Take 650 mg by mouth every 6 (six) hours as needed.   Past Month at Unknown time  . albuterol (PROVENTIL HFA;VENTOLIN HFA) 108 (90 Base) MCG/ACT inhaler Inhale 2 puffs into the lungs every 6 (six) hours as needed for wheezing or shortness of breath. 1 Inhaler 2 03/01/2017 at Unknown time  . AMLODIPINE BESYLATE PO Take 5 mg by mouth daily.   03/02/2017 at Unknown time  . Cholecalciferol (VITAMIN D) 2000 units CAPS Take 1 capsule by mouth daily.   03/01/2017 at Unknown time  . lisinopril (PRINIVIL,ZESTRIL) 40 MG tablet Take 40 mg by mouth daily.   03/02/2017 at Unknown time  . pantoprazole (PROTONIX) 40 MG tablet Take 1 tablet (40 mg total) by mouth daily. For possible gastritis. 40 tablet 3 03/02/2017 at Unknown time  . Cyanocobalamin ([employer]S B-12) 1500 MCG TBDP Take 1 tablet by mouth daily.   Not Taking at Unknown time  . folic acid (FOLVITE) 1 MG tablet Take 1 tablet (1 mg total) by mouth daily. (Patient not taking: Reported on 03/02/2017)   Not Taking at Unknown time  . lisinopril-hydrochlorothiazide (PRINZIDE,ZESTORETIC) 20-12.5 MG tablet Starting on 04/07/16, take 1 pill EVERY OTHER DAY until further evaluation and changes by your primary care provider. (Patient not taking: Reported on 03/02/2017)   Not Taking at Unknown time   . thiamine 100 MG tablet Take 1 tablet (100 mg total) by mouth daily. (Patient not taking: Reported on 03/02/2017)   Not Taking at Unknown time     No Known Allergies   Past Medical History:  Diagnosis Date  . AKI (acute kidney injury) ([place]) 04/04/2016  . Anemia   . Arthritis   . Asthma   . ETOH abuse   . Folate deficiency 04/05/2016  . Heme positive stool 04/05/2016  . Hypertension     Review of systems:      Physical Exam    Heart and lungs: Regular rate and rhythm without rub or gallop, lungs are bilaterally clear.    HEENT: Normocephalic atraumatic eyes are anicteric    Other:     Pertinant exam for procedure: Soft nontender nondistended bowel sounds positive normoactive.    Planned proceedures: Colonoscopy and indicated procedures. I have discussed the risks benefits and complications of procedures to include not limited to bleeding, infection, perforation and the risk of sedation and the patient wishes to proceed.    Lollie Sails, MD Gastroenterology 03/02/2017  12:36 PM

## 2017-03-02 NOTE — Op Note (Signed)
[facility] Patient Name: Wesley Francis Procedure Date: 03/02/2017 1:41 PM MRN: [id number] Account #: [id number] Date of Birth: Dec 31, 1965 Admit Type: Outpatient Age: 51 Room: [facility] ENDO ROOM 3 Gender: Male Note Status: Finalized Procedure:            Colonoscopy Indications:          Heme positive stool Providers:            Lollie Sails, MD Referring MD:         [place] Jansky. Cobb (Referring MD) Medicines:            Monitored Anesthesia Care Complications:        No immediate complications. Procedure:            Pre-Anesthesia Assessment:                       - ASA Grade Assessment: III - A patient with severe                        systemic disease.                       After obtaining informed consent, the colonoscope was                        passed under direct vision. Throughout the procedure,                        the patient's blood pressure, pulse, and oxygen                        saturations were monitored continuously. The                        Colonoscope was introduced through the anus and                        advanced to the the cecum, identified by appendiceal                        orifice and ileocecal valve. The colonoscopy was                        performed with moderate difficulty due to poor bowel                        prep. Successful completion of the procedure was aided                        by lavage. Findings:      The colon (entire examined portion) appeared normal.      Non-bleeding internal hemorrhoids were found during anoscopy. The       hemorrhoids were small and Grade I (internal hemorrhoids that do not       prolapse).      The digital rectal exam was normal. Impression:           - The entire examined colon is normal.                       - Non-bleeding internal hemorrhoids.                       -  No specimens collected. Recommendation:       - Discharge patient to home.   - Repeat colonoscopy in 3 - 5 years for screening                        purposes. Procedure Code(s):    --- Professional ---                       [phone], Colonoscopy, flexible; diagnostic, including                        collection of specimen(s) by brushing or washing, when                        performed (separate procedure) Diagnosis Code(s):    --- Professional ---                       K64.0, First degree hemorrhoids                       R19.5, Other fecal abnormalities CPT copyright 2016 American Medical Association. All rights reserved. The codes documented in this report are preliminary and upon coder review may  be revised to meet current compliance requirements. Lollie Sails, MD 03/02/2017 2:25:33 PM This report has been signed electronically. Number of Addenda: 0 Note Initiated On: 03/02/2017 1:41 PM Scope Withdrawal Time: 0 hours 11 minutes 51 seconds  Total Procedure Duration: 0 hours 23 minutes 25 seconds       [facility]

## 2017-03-02 NOTE — Transfer of Care (Signed)
Immediate Anesthesia Transfer of Care Note  Patient: Wesley Francis  Procedure(s) Performed: Procedure(s): COLONOSCOPY WITH PROPOFOL (N/A)  Patient Location: PACU and Endoscopy Unit  Anesthesia Type:General  Level of Consciousness: sedated  Airway & Oxygen Therapy: Patient Spontanous Breathing and Patient connected to nasal cannula oxygen  Post-op Assessment: Report given to RN and Post -op Vital signs reviewed and stable  Post vital signs: Reviewed and stable  Last Vitals:  Vitals:   03/02/17 1216  BP: (!) 174/93  Pulse: 72  Resp: 18  Temp: (!) 35.9 C    Last Pain:  Vitals:   03/02/17 1216  TempSrc: Tympanic         Complications: No apparent anesthesia complications

## 2017-03-02 NOTE — Anesthesia Preprocedure Evaluation (Signed)
Anesthesia Evaluation  Patient identified by MRN, date of birth, ID band Patient awake    Reviewed: Allergy & Precautions, NPO status , Patient's Chart, lab work & pertinent test results  Airway Mallampati: III       Dental  (+) Poor Dentition   Pulmonary COPD, Current Smoker,     + decreased breath sounds      Cardiovascular Exercise Tolerance: Poor hypertension, Pt. on medications  Rhythm:Regular     Neuro/Psych    GI/Hepatic negative GI ROS, Neg liver ROS,   Endo/Other  negative endocrine ROS  Renal/GU      Musculoskeletal   Abdominal Normal abdominal exam  (+)   Peds negative pediatric ROS (+)  Hematology  (+) anemia ,   Anesthesia Other Findings   Reproductive/Obstetrics                             Anesthesia Physical Anesthesia Plan  ASA: III  Anesthesia Plan: General   Post-op Pain Management:    Induction: Intravenous  Airway Management Planned: Natural Airway and Nasal Cannula  Additional Equipment:   Intra-op Plan:   Post-operative Plan:   Informed Consent: I have reviewed the patients History and Physical, chart, labs and discussed the procedure including the risks, benefits and alternatives for the proposed anesthesia with the patient or authorized representative who has indicated his/her understanding and acceptance.     Plan Discussed with: CRNA  Anesthesia Plan Comments:         Anesthesia Quick Evaluation

## 2017-03-02 NOTE — Anesthesia Post-op Follow-up Note (Cosign Needed)
Anesthesia QCDR form completed.        

## 2017-03-02 NOTE — Anesthesia Postprocedure Evaluation (Signed)
Anesthesia Post Note  Patient: Wesley Francis  Procedure(s) Performed: Procedure(s) (LRB): COLONOSCOPY WITH PROPOFOL (N/A)  Patient location during evaluation: PACU Anesthesia Type: General Level of consciousness: awake Pain management: pain level controlled Vital Signs Assessment: post-procedure vital signs reviewed and stable Respiratory status: spontaneous breathing Cardiovascular status: stable Anesthetic complications: no     Last Vitals:  Vitals:   03/02/17 1429 03/02/17 1430  BP: 102/74 102/74  Pulse: 85   Resp: 18   Temp: (!) 35.6 C (!) 35.6 C    Last Pain:  Vitals:   03/02/17 1430  TempSrc: Tympanic                 VAN STAVEREN,Benisha Hadaway

## 2017-03-04 ENCOUNTER — Encounter: Payer: Self-pay | Admitting: Gastroenterology

## 2017-08-21 ENCOUNTER — Emergency Department
Admission: EM | Admit: 2017-08-21 | Discharge: 2017-08-21 | Disposition: A | Discharge status: Home / Self Care | Payer: Medicaid Other | Attending: Emergency Medicine | Admitting: Emergency Medicine

## 2017-08-21 ENCOUNTER — Encounter: Payer: Self-pay | Admitting: Medical Oncology

## 2017-08-21 DIAGNOSIS — F17210 Nicotine dependence, cigarettes, uncomplicated: Secondary | ICD-10-CM | POA: Insufficient documentation

## 2017-08-21 DIAGNOSIS — I10 Essential (primary) hypertension: Secondary | ICD-10-CM | POA: Diagnosis not present

## 2017-08-21 DIAGNOSIS — K0381 Cracked tooth: Secondary | ICD-10-CM | POA: Diagnosis not present

## 2017-08-21 DIAGNOSIS — K029 Dental caries, unspecified: Secondary | ICD-10-CM | POA: Insufficient documentation

## 2017-08-21 DIAGNOSIS — J45909 Unspecified asthma, uncomplicated: Secondary | ICD-10-CM | POA: Diagnosis not present

## 2017-08-21 DIAGNOSIS — Z79899 Other long term (current) drug therapy: Secondary | ICD-10-CM | POA: Insufficient documentation

## 2017-08-21 DIAGNOSIS — K047 Periapical abscess without sinus: Secondary | ICD-10-CM | POA: Insufficient documentation

## 2017-08-21 DIAGNOSIS — K0889 Other specified disorders of teeth and supporting structures: Secondary | ICD-10-CM | POA: Diagnosis present

## 2017-08-21 DIAGNOSIS — S025XXA Fracture of tooth (traumatic), initial encounter for closed fracture: Secondary | ICD-10-CM

## 2017-08-21 MED ORDER — OXYCODONE-ACETAMINOPHEN 5-325 MG PO TABS: 1.0000 | ORAL_TABLET | Freq: Four times a day (QID) | ORAL | 0 refills | Status: DC | PRN

## 2017-08-21 MED ORDER — LIDOCAINE VISCOUS 2 % MT SOLN
15.0000 mL | Freq: Once | OROMUCOSAL | Status: AC
  Administered 2017-08-21: 15 mL via OROMUCOSAL
  Filled 2017-08-21: qty 15

## 2017-08-21 MED ORDER — IBUPROFEN 600 MG PO TABS: 600.0000 mg | ORAL_TABLET | Freq: Four times a day (QID) | ORAL | 0 refills | Status: DC | PRN

## 2017-08-21 MED ORDER — IBUPROFEN 800 MG PO TABS
800.0000 mg | ORAL_TABLET | Freq: Once | ORAL | Status: AC
  Administered 2017-08-21: 800 mg via ORAL
  Filled 2017-08-21: qty 1

## 2017-08-21 MED ORDER — AMOXICILLIN 500 MG PO CAPS: 500.0000 mg | ORAL_CAPSULE | Freq: Three times a day (TID) | ORAL | 0 refills | Status: DC

## 2017-08-21 MED ORDER — OXYCODONE-ACETAMINOPHEN 5-325 MG PO TABS
1.0000 | ORAL_TABLET | Freq: Once | ORAL | Status: AC
  Administered 2017-08-21: 1 via ORAL
  Filled 2017-08-21: qty 1

## 2017-08-21 NOTE — ED Triage Notes (Signed)
Pt reports left sided dental pain that began last night.

## 2017-08-21 NOTE — ED Provider Notes (Signed)
[facility] Emergency Department Provider Note   ____________________________________________   First MD Initiated Contact with Patient 08/21/17 [phone]     (approximate)  I have reviewed the triage vital signs and the nursing notes.   HISTORY  Chief Complaint Dental Pain    HPI Wesley Francis is a 51 y.o. male patient complaining of dental pain which began last night. Patient has a history of devitalized teeth and multiple caries. Patient's wife state they will schedule an appointment with dentist early part of next week.Patient rates his pain as a 10 over 10. No palliative measures for complaint.   Past Medical History:  Diagnosis Date  . AKI (acute kidney injury) ([place]) 04/04/2016  . Anemia   . Arthritis   . Asthma   . ETOH abuse   . Folate deficiency 04/05/2016  . Heme positive stool 04/05/2016  . Hypertension     Patient Active Problem List   Diagnosis Date Noted  . Alcohol intoxication ([place])   . Chest congestion   . Folate deficiency 04/05/2016  . Heme positive stool 04/05/2016  . AKI (acute kidney injury) ([place]) 04/04/2016  . Alcohol abuse 04/04/2016  . Tobacco abuse 04/04/2016  . Normocytic anemia 04/04/2016  . Chronic low back pain 04/04/2016  . Chronic leg pain 04/04/2016    Past Surgical History:  Procedure Laterality Date  . COLONOSCOPY    . COLONOSCOPY WITH PROPOFOL N/A 03/02/2017   Procedure: COLONOSCOPY WITH PROPOFOL;  Surgeon: Lollie Sails, MD;  Location: [facility] ENDOSCOPY;  Service: Endoscopy;  Laterality: N/A;  . FRACTURE SURGERY    . KNEE ARTHROCENTESIS      Prior to Admission medications   Medication Sig Start Date End Date Taking? Authorizing Provider  acetaminophen (TYLENOL) 325 MG tablet Take 650 mg by mouth every 6 (six) hours as needed.    [provider]  albuterol (PROVENTIL HFA;VENTOLIN HFA) 108 (90 Base) MCG/ACT inhaler Inhale 2 puffs into the lungs every 6 (six) hours as needed for wheezing or  shortness of breath. 04/06/16   Rexene Alberts, MD  AMLODIPINE BESYLATE PO Take 5 mg by mouth daily.    [provider]  amoxicillin (AMOXIL) 500 MG capsule Take 1 capsule (500 mg total) by mouth 3 (three) times daily. 08/21/17   Sable Feil, PA-C  Cholecalciferol (VITAMIN D) 2000 units CAPS Take 1 capsule by mouth daily.    [provider]  Cyanocobalamin ([employer] B-12) 1500 MCG TBDP Take 1 tablet by mouth daily.    [provider]  folic acid (FOLVITE) 1 MG tablet Take 1 tablet (1 mg total) by mouth daily. Patient not taking: Reported on 03/02/2017 04/06/16   Rexene Alberts, MD  ibuprofen (ADVIL,MOTRIN) 600 MG tablet Take 1 tablet (600 mg total) by mouth every 6 (six) hours as needed. 08/21/17   Sable Feil, PA-C  lisinopril (PRINIVIL,ZESTRIL) 40 MG tablet Take 40 mg by mouth daily.    [provider]  lisinopril-hydrochlorothiazide (PRINZIDE,ZESTORETIC) 20-12.5 MG tablet Starting on 04/07/16, take 1 pill EVERY OTHER DAY until further evaluation and changes by your primary care provider. Patient not taking: Reported on 03/02/2017 04/06/16   Rexene Alberts, MD  oxyCODONE-acetaminophen (ROXICET) 5-325 MG tablet Take 1 tablet by mouth every 6 (six) hours as needed for moderate pain. 08/21/17   Sable Feil, PA-C  pantoprazole (PROTONIX) 40 MG tablet Take 1 tablet (40 mg total) by mouth daily. For possible gastritis. 04/06/16   Rexene Alberts, MD  thiamine 100 MG  tablet Take 1 tablet (100 mg total) by mouth daily. Patient not taking: Reported on 03/02/2017 04/06/16   Rexene Alberts, MD    Allergies Patient has no known allergies.  Family History  Problem Relation Age of Onset  . Hypertension Mother   . Stroke Mother   . Hypertension Father     Social History Social History  Substance Use Topics  . Smoking status: Current Every Day Smoker    Packs/day: 1.00    Types: Cigarettes  . Smokeless tobacco: Never Used  . Alcohol use No     Comment: Everyday drinker,  whiskey. Quit 2001    Review of Systems  Constitutional: No fever/chills Eyes: No visual changes. ENT: No sore throat. Cardiovascular: Denies chest pain. Respiratory: Denies shortness of breath. Gastrointestinal: No abdominal pain.  No nausea, no vomiting.  No diarrhea.  No constipation. Genitourinary: Negative for dysuria. Musculoskeletal: Negative for back pain. Skin: Negative for rash. Neurological: Negative for headaches, focal weakness or numbness. Psychiatric:EtOH abuse Endocrine:Hypertension   ____________________________________________   PHYSICAL EXAM:  VITAL SIGNS: ED Triage Vitals  Enc Vitals Group     BP 08/21/17 0816 117/74     Pulse Rate 08/21/17 0816 71     Resp 08/21/17 0816 18     Temp 08/21/17 0816 98 F (36.7 C)     Temp Source 08/21/17 0816 Oral     SpO2 08/21/17 0816 97 %     Weight 08/21/17 0810 140 lb (63.5 kg)     Height 08/21/17 0816 5\' 5"  (1.651 m)     Head Circumference --      Peak Flow --      Pain Score 08/21/17 0809 10     Pain Loc --      Pain Edu? --      Excl. in [place]? --     Constitutional: Alert and oriented. Well appearing and in no acute distress. Mouth/Throat: Mucous membranes are moist.  Oropharynx non-erythematous.Edematous gingiva and loose and cracked tooth #23. Hematological/Lymphatic/Immunilogical: No cervical lymphadenopathy. Cardiovascular: Normal rate, regular rhythm. Grossly normal heart sounds.  Good peripheral circulation. Respiratory: Normal respiratory effort.  No retractions. Lungs CTAB. Neurologic:  Normal speech and language. No gross focal neurologic deficits are appreciated. No gait instability. Skin:  Skin is warm, dry and intact. No rash noted. Psychiatric: Mood and affect are normal. Speech and behavior are normal.  ____________________________________________   LABS (all labs ordered are listed, but only abnormal results are displayed)  Labs Reviewed - No data to  display ____________________________________________  EKG   ____________________________________________  RADIOLOGY  No results found.  ____________________________________________   PROCEDURES  Procedure(s) performed: None  Procedures  Critical Care performed: No  ____________________________________________   INITIAL IMPRESSION / ASSESSMENT AND PLAN / ED COURSE  Pertinent labs & imaging results that were available during my care of the patient were reviewed by me and considered in my medical decision making (see chart for details).  Dental pain secondary to fractures and devitalize dentures. Patient advised to follow with dentist as soon as possible. Patient given discharge care instructions take medication as directed.      ____________________________________________   FINAL CLINICAL IMPRESSION(S) / ED DIAGNOSES  Final diagnoses:  Closed fracture of tooth, initial encounter  Infected dental caries      NEW MEDICATIONS STARTED DURING THIS VISIT:  New Prescriptions   AMOXICILLIN (AMOXIL) 500 MG CAPSULE    Take 1 capsule (500 mg total) by mouth 3 (three) times daily.   IBUPROFEN (ADVIL,MOTRIN)  600 MG TABLET    Take 1 tablet (600 mg total) by mouth every 6 (six) hours as needed.   OXYCODONE-ACETAMINOPHEN (ROXICET) 5-325 MG TABLET    Take 1 tablet by mouth every 6 (six) hours as needed for moderate pain.     Note:  This document was prepared using Dragon voice recognition software and may include unintentional dictation errors.    Sable Feil, PA-C 08/21/17 [place], [place], MD 08/22/17 1500

## 2017-08-21 NOTE — Discharge Instructions (Signed)
Follow-up with her treating dentist as soon as possible.

## 2017-12-20 ENCOUNTER — Encounter (HOSPITAL_COMMUNITY): Payer: Self-pay | Admitting: Emergency Medicine

## 2017-12-20 ENCOUNTER — Emergency Department (HOSPITAL_COMMUNITY): Payer: Medicaid Other

## 2017-12-20 ENCOUNTER — Inpatient Hospital Stay (HOSPITAL_COMMUNITY)
Admission: EM | Admit: 2017-12-20 | Discharge: 2017-12-22 | DRG: 896 | Disposition: A | Discharge status: Home / Self Care | Payer: Medicaid Other | Attending: Internal Medicine | Admitting: Internal Medicine

## 2017-12-20 ENCOUNTER — Other Ambulatory Visit: Payer: Self-pay

## 2017-12-20 DIAGNOSIS — F1010 Alcohol abuse, uncomplicated: Secondary | ICD-10-CM | POA: Diagnosis not present

## 2017-12-20 DIAGNOSIS — G9341 Metabolic encephalopathy: Secondary | ICD-10-CM | POA: Diagnosis present

## 2017-12-20 DIAGNOSIS — E872 Acidosis, unspecified: Secondary | ICD-10-CM | POA: Diagnosis present

## 2017-12-20 DIAGNOSIS — I4510 Unspecified right bundle-branch block: Secondary | ICD-10-CM | POA: Diagnosis present

## 2017-12-20 DIAGNOSIS — Z720 Tobacco use: Secondary | ICD-10-CM | POA: Diagnosis not present

## 2017-12-20 DIAGNOSIS — Z791 Long term (current) use of non-steroidal anti-inflammatories (NSAID): Secondary | ICD-10-CM

## 2017-12-20 DIAGNOSIS — D649 Anemia, unspecified: Secondary | ICD-10-CM | POA: Diagnosis present

## 2017-12-20 DIAGNOSIS — Z823 Family history of stroke: Secondary | ICD-10-CM

## 2017-12-20 DIAGNOSIS — Y906 Blood alcohol level of 120-199 mg/100 ml: Secondary | ICD-10-CM | POA: Diagnosis present

## 2017-12-20 DIAGNOSIS — E162 Hypoglycemia, unspecified: Secondary | ICD-10-CM | POA: Diagnosis present

## 2017-12-20 DIAGNOSIS — R159 Full incontinence of feces: Secondary | ICD-10-CM | POA: Diagnosis present

## 2017-12-20 DIAGNOSIS — J45909 Unspecified asthma, uncomplicated: Secondary | ICD-10-CM | POA: Diagnosis present

## 2017-12-20 DIAGNOSIS — I10 Essential (primary) hypertension: Secondary | ICD-10-CM | POA: Diagnosis present

## 2017-12-20 DIAGNOSIS — Z8249 Family history of ischemic heart disease and other diseases of the circulatory system: Secondary | ICD-10-CM

## 2017-12-20 DIAGNOSIS — M545 Low back pain: Secondary | ICD-10-CM | POA: Diagnosis present

## 2017-12-20 DIAGNOSIS — M79606 Pain in leg, unspecified: Secondary | ICD-10-CM | POA: Diagnosis present

## 2017-12-20 DIAGNOSIS — R680 Hypothermia, not associated with low environmental temperature: Secondary | ICD-10-CM | POA: Diagnosis present

## 2017-12-20 DIAGNOSIS — T68XXXA Hypothermia, initial encounter: Secondary | ICD-10-CM | POA: Diagnosis not present

## 2017-12-20 DIAGNOSIS — E860 Dehydration: Secondary | ICD-10-CM | POA: Diagnosis present

## 2017-12-20 DIAGNOSIS — Z79899 Other long term (current) drug therapy: Secondary | ICD-10-CM

## 2017-12-20 DIAGNOSIS — E538 Deficiency of other specified B group vitamins: Secondary | ICD-10-CM | POA: Diagnosis present

## 2017-12-20 DIAGNOSIS — E8729 Other acidosis: Secondary | ICD-10-CM | POA: Diagnosis present

## 2017-12-20 DIAGNOSIS — G8929 Other chronic pain: Secondary | ICD-10-CM | POA: Diagnosis present

## 2017-12-20 DIAGNOSIS — F10239 Alcohol dependence with withdrawal, unspecified: Principal | ICD-10-CM | POA: Diagnosis present

## 2017-12-20 DIAGNOSIS — M1990 Unspecified osteoarthritis, unspecified site: Secondary | ICD-10-CM | POA: Diagnosis present

## 2017-12-20 DIAGNOSIS — F17210 Nicotine dependence, cigarettes, uncomplicated: Secondary | ICD-10-CM | POA: Diagnosis present

## 2017-12-20 LAB — BASIC METABOLIC PANEL
Anion gap: 20 — ABNORMAL HIGH (ref 5–15)
BUN: 21 mg/dL — AB (ref 6–20)
CALCIUM: 8.4 mg/dL — AB (ref 8.9–10.3)
CO2: 13 mmol/L — ABNORMAL LOW (ref 22–32)
Chloride: 106 mmol/L (ref 101–111)
Creatinine, Ser: 0.98 mg/dL (ref 0.61–1.24)
GFR calc Af Amer: >60 mL/min (ref >60)
GLUCOSE: 77 mg/dL (ref 65–99)
POTASSIUM: 4.1 mmol/L (ref 3.5–5.1)
Sodium: 139 mmol/L (ref 135–145)

## 2017-12-20 LAB — URINALYSIS, ROUTINE W REFLEX MICROSCOPIC
Bacteria, UA: NONE SEEN
Bilirubin Urine: NEGATIVE
GLUCOSE, UA: NEGATIVE mg/dL
KETONES UR: 20 mg/dL — AB
Leukocytes, UA: NEGATIVE
Nitrite: NEGATIVE
PH: 5 (ref 5.0–8.0)
Protein, ur: 30 mg/dL — AB
Specific Gravity, Urine: 1.015 (ref 1.005–1.030)

## 2017-12-20 LAB — COMPREHENSIVE METABOLIC PANEL
ALBUMIN: 4.3 g/dL (ref 3.5–5.0)
ALT: 29 U/L (ref 17–63)
AST: 106 U/L — AB (ref 15–41)
Alkaline Phosphatase: 34 U/L — ABNORMAL LOW (ref 38–126)
Anion gap: 25 — ABNORMAL HIGH (ref 5–15)
BUN: 20 mg/dL (ref 6–20)
CHLORIDE: 106 mmol/L (ref 101–111)
CO2: 12 mmol/L — AB (ref 22–32)
Calcium: 8.7 mg/dL — ABNORMAL LOW (ref 8.9–10.3)
Creatinine, Ser: 0.97 mg/dL (ref 0.61–1.24)
GFR calc Af Amer: >60 mL/min (ref >60)
GFR calc non Af Amer: >60 mL/min (ref >60)
Glucose, Bld: 29 mg/dL — CL (ref 65–99)
POTASSIUM: 3.5 mmol/L (ref 3.5–5.1)
SODIUM: 143 mmol/L (ref 135–145)
Total Bilirubin: 0.6 mg/dL (ref 0.3–1.2)
Total Protein: 7.7 g/dL (ref 6.5–8.1)

## 2017-12-20 LAB — RAPID URINE DRUG SCREEN, HOSP PERFORMED
AMPHETAMINES: NOT DETECTED
BARBITURATES: NOT DETECTED
BENZODIAZEPINES: NOT DETECTED
Cocaine: NOT DETECTED
Opiates: NOT DETECTED
Tetrahydrocannabinol: NOT DETECTED

## 2017-12-20 LAB — GLUCOSE, CAPILLARY
Glucose-Capillary: 64 mg/dL — ABNORMAL LOW (ref 65–99)
Glucose-Capillary: 78 mg/dL (ref 65–99)

## 2017-12-20 LAB — CBC
HEMATOCRIT: 27.5 % — AB (ref 39.0–52.0)
HEMOGLOBIN: 8.8 g/dL — AB (ref 13.0–17.0)
MCH: 28.5 pg (ref 26.0–34.0)
MCHC: 32 g/dL (ref 30.0–36.0)
MCV: 89 fL (ref 78.0–100.0)
Platelets: 223 10*3/uL (ref 150–400)
RBC: 3.09 MIL/uL — ABNORMAL LOW (ref 4.22–5.81)
RDW: 22.9 % — AB (ref 11.5–15.5)
WBC: 4.7 10*3/uL (ref 4.0–10.5)

## 2017-12-20 LAB — CBG MONITORING, ED
GLUCOSE-CAPILLARY: 215 mg/dL — AB (ref 65–99)
GLUCOSE-CAPILLARY: 207 mg/dL — AB (ref 65–99)
Glucose-Capillary: 20 mg/dL — CL (ref 65–99)
Glucose-Capillary: 153 mg/dL — ABNORMAL HIGH (ref 65–99)

## 2017-12-20 LAB — PHOSPHORUS: Phosphorus: 2.4 mg/dL — ABNORMAL LOW (ref 2.5–4.6)

## 2017-12-20 LAB — BLOOD GAS, VENOUS
Acid-base deficit: 16.9 mmol/L — ABNORMAL HIGH (ref 0.0–2.0)
Bicarbonate: 11.2 mmol/L — ABNORMAL LOW (ref 20.0–28.0)
O2 Saturation: 80 %
PCO2 VEN: 33.4 mmHg — AB (ref 44.0–60.0)
PH VEN: 7.125 — AB (ref 7.250–7.430)
Patient temperature: 31.3
pO2, Ven: 63.6 mmHg — ABNORMAL HIGH (ref 32.0–45.0)

## 2017-12-20 LAB — CBC WITH DIFFERENTIAL/PLATELET
BASOS ABS: 0 10*3/uL (ref 0.0–0.1)
Basophils Relative: 1 %
Eosinophils Absolute: 0 10*3/uL (ref 0.0–0.7)
Eosinophils Relative: 0 %
HEMATOCRIT: 32.6 % — AB (ref 39.0–52.0)
Hemoglobin: 10.3 g/dL — ABNORMAL LOW (ref 13.0–17.0)
LYMPHS ABS: 0.5 10*3/uL — AB (ref 0.7–4.0)
Lymphocytes Relative: 12 %
MCH: 28.4 pg (ref 26.0–34.0)
MCHC: 31.6 g/dL (ref 30.0–36.0)
MCV: 89.8 fL (ref 78.0–100.0)
MONO ABS: 0.2 10*3/uL (ref 0.1–1.0)
MONOS PCT: 4 %
NEUTROS ABS: 3.2 10*3/uL (ref 1.7–7.7)
Neutrophils Relative %: 83 %
PLATELETS: 293 10*3/uL (ref 150–400)
RBC: 3.63 MIL/uL — AB (ref 4.22–5.81)
RDW: 22 % — AB (ref 11.5–15.5)
WBC: 3.9 10*3/uL — AB (ref 4.0–10.5)

## 2017-12-20 LAB — I-STAT CHEM 8, ED
BUN: 18 mg/dL (ref 6–20)
CALCIUM ION: 1.04 mmol/L — AB (ref 1.15–1.40)
Chloride: 111 mmol/L (ref 101–111)
Creatinine, Ser: 1 mg/dL (ref 0.61–1.24)
GLUCOSE: 27 mg/dL — AB (ref 65–99)
HCT: 40 % (ref 39.0–52.0)
HEMOGLOBIN: 13.6 g/dL (ref 13.0–17.0)
POTASSIUM: 3.4 mmol/L — AB (ref 3.5–5.1)
Sodium: 143 mmol/L (ref 135–145)
TCO2: 14 mmol/L — ABNORMAL LOW (ref 22–32)

## 2017-12-20 LAB — MAGNESIUM: Magnesium: 1.4 mg/dL — ABNORMAL LOW (ref 1.7–2.4)

## 2017-12-20 LAB — AMMONIA: AMMONIA: 31 umol/L (ref 9–35)

## 2017-12-20 LAB — SALICYLATE LEVEL

## 2017-12-20 LAB — BETA-HYDROXYBUTYRIC ACID: BETA-HYDROXYBUTYRIC ACID: 2.93 mmol/L — AB (ref 0.05–0.27)

## 2017-12-20 LAB — LACTIC ACID, PLASMA
LACTIC ACID, VENOUS: 9.2 mmol/L — AB (ref 0.5–1.9)
Lactic Acid, Venous: 11 mmol/L (ref 0.5–1.9)

## 2017-12-20 LAB — MRSA PCR SCREENING: MRSA BY PCR: NEGATIVE

## 2017-12-20 LAB — TSH: TSH: 0.185 u[IU]/mL — ABNORMAL LOW (ref 0.350–4.500)

## 2017-12-20 LAB — ETHANOL: Alcohol, Ethyl (B): 174 mg/dL — ABNORMAL HIGH (ref <10)

## 2017-12-20 LAB — TROPONIN I

## 2017-12-20 LAB — ACETAMINOPHEN LEVEL: Acetaminophen (Tylenol), Serum: <10 ug/mL — ABNORMAL LOW (ref 10–30)

## 2017-12-20 MED ORDER — ENOXAPARIN SODIUM 40 MG/0.4ML ~~LOC~~ SOLN
40.0000 mg | SUBCUTANEOUS | Status: DC
  Administered 2017-12-21 (×2): 40 mg via SUBCUTANEOUS
  Filled 2017-12-20 (×2): qty 0.4

## 2017-12-20 MED ORDER — SODIUM BICARBONATE 8.4 % IV SOLN
INTRAVENOUS | Status: DC
  Administered 2017-12-20: 21:00:00 via INTRAVENOUS
  Filled 2017-12-20 (×2): qty 850

## 2017-12-20 MED ORDER — ONDANSETRON HCL 4 MG/2ML IJ SOLN: 4.0000 mg | Freq: Four times a day (QID) | INTRAMUSCULAR | Status: DC | PRN

## 2017-12-20 MED ORDER — THIAMINE HCL 100 MG/ML IJ SOLN
100.0000 mg | Freq: Once | INTRAMUSCULAR | Status: AC
  Administered 2017-12-20: 100 mg via INTRAVENOUS
  Filled 2017-12-20: qty 2

## 2017-12-20 MED ORDER — LORAZEPAM 2 MG/ML IJ SOLN
2.0000 mg | INTRAMUSCULAR | Status: DC | PRN
  Administered 2017-12-21: 3 mg via INTRAVENOUS
  Filled 2017-12-20: qty 2

## 2017-12-20 MED ORDER — FOLIC ACID 5 MG/ML IJ SOLN
1.0000 mg | Freq: Every day | INTRAMUSCULAR | Status: DC
  Administered 2017-12-20 – 2017-12-21 (×2): 1 mg via INTRAVENOUS
  Filled 2017-12-20 (×6): qty 0.2

## 2017-12-20 MED ORDER — VITAMIN B-1 100 MG PO TABS
100.0000 mg | ORAL_TABLET | Freq: Every day | ORAL | Status: DC
  Administered 2017-12-20 – 2017-12-22 (×3): 100 mg via ORAL
  Filled 2017-12-20 (×3): qty 1

## 2017-12-20 MED ORDER — DEXTROSE 50 % IV SOLN
INTRAVENOUS | Status: AC
  Administered 2017-12-20: 12:00:00
  Filled 2017-12-20: qty 50

## 2017-12-20 MED ORDER — ONDANSETRON HCL 4 MG PO TABS: 4.0000 mg | ORAL_TABLET | Freq: Four times a day (QID) | ORAL | Status: DC | PRN

## 2017-12-20 MED ORDER — ACETAMINOPHEN 650 MG RE SUPP: 650.0000 mg | Freq: Four times a day (QID) | RECTAL | Status: DC | PRN

## 2017-12-20 MED ORDER — MAGNESIUM SULFATE 4 GM/100ML IV SOLN
4.0000 g | Freq: Once | INTRAVENOUS | Status: AC
  Administered 2017-12-20: 4 g via INTRAVENOUS
  Filled 2017-12-20 (×2): qty 100

## 2017-12-20 MED ORDER — KCL IN DEXTROSE-NACL 20-5-0.9 MEQ/L-%-% IV SOLN: INTRAVENOUS | Status: DC

## 2017-12-20 MED ORDER — ACETAMINOPHEN 325 MG PO TABS: 650.0000 mg | ORAL_TABLET | Freq: Four times a day (QID) | ORAL | Status: DC | PRN

## 2017-12-20 MED ORDER — DEXTROSE 50 % IV SOLN
1.0000 | Freq: Once | INTRAVENOUS | Status: AC
  Administered 2017-12-20: 50 mL via INTRAVENOUS

## 2017-12-20 MED ORDER — FOLIC ACID 5 MG/ML IJ SOLN
1.0000 mg | Freq: Once | INTRAMUSCULAR | Status: AC
  Administered 2017-12-20: 1 mg via INTRAVENOUS
  Filled 2017-12-20: qty 0.2

## 2017-12-20 MED ORDER — NICOTINE 21 MG/24HR TD PT24
21.0000 mg | MEDICATED_PATCH | Freq: Every day | TRANSDERMAL | Status: DC
  Administered 2017-12-20 – 2017-12-22 (×3): 21 mg via TRANSDERMAL
  Filled 2017-12-20 (×3): qty 1

## 2017-12-20 MED ORDER — KCL IN DEXTROSE-NACL 20-5-0.9 MEQ/L-%-% IV SOLN
INTRAVENOUS | Status: DC
  Administered 2017-12-20: 13:00:00 via INTRAVENOUS
  Filled 2017-12-20 (×4): qty 1000

## 2017-12-20 MED ORDER — INFLUENZA VAC SPLIT QUAD 0.5 ML IM SUSY
0.5000 mL | PREFILLED_SYRINGE | INTRAMUSCULAR | Status: DC
  Filled 2017-12-20 (×2): qty 0.5

## 2017-12-20 NOTE — Progress Notes (Signed)
CRITICAL VALUE ALERT  Critical Value:  9.2  Date & Time Notied:  12/20/17 1800  Provider Notified: Memon  Orders Received/Actions taken: No new orders

## 2017-12-20 NOTE — ED Notes (Signed)
Iv started in triage due bs 20. edp aware.

## 2017-12-20 NOTE — ED Triage Notes (Signed)
Pt c/o sob. States dropped of by sister. Pt slow to answer questions but oriented to most, unknown of baseline.  Pt smells of kerosene. Sob started today. Denies cough. No resp distress or sob noted in triage.

## 2017-12-20 NOTE — ED Notes (Signed)
CRITICAL VALUE ALERT  Critical Value:  Lactic acid 11.0  Date & Time Notied:  12/20/17 1434  Provider Notified: Dr. Tomi Bamberger  Orders Received/Actions taken: no new orders

## 2017-12-20 NOTE — ED Notes (Signed)
Bear hugger in place

## 2017-12-20 NOTE — H&P (Signed)
History and Physical    Wesley Francis UMP:[id number] DOB: [birth year] DOA: 12/20/2017  PCP: Jerel Shepherd, FNP  Patient coming from: Home  I have personally briefly reviewed patient's old medical records in [place]  Chief Complaint: altered mental status  HPI: Wesley Francis is a 52 y.o. male with medical history significant of alcohol abuse, hypertension, tobacco use, is brought to the hospital with altered mental status.  Patient was reportedly last seen normal yesterday at approximately 4:30 PM.  Is found him up to check on him today noted that he was increasingly confused, altered mental status.  He has not had any fevers.  No cough, shortness of breath, dysuria, vomiting or diarrhea.  Patient drinks alcohol regularly, last drink was on 1/13.  He drinks whiskey daily.  Family says that when he is drinking alcohol, he is intake of other food/liquids is minimal.  ED Course: On arrival to the emergency room, patient was noted to be hypothermic with a temperature of 88.  Blood sugar was checked and was noted to be extremely low at 20.  Basic labs indicated metabolic acidosis with a bicarb of 12 and a pH of 7.1.  Chest x-ray did not show any evidence of pneumonia.  Urinalysis did not show any signs of urine toxicology screen was negative.  Blood alcohol level noted to be elevated at 170.  He was placed on a warming blanket.  He received dextrose infusion with improvement of blood sugars.  Mental status improved once blood sugar improved.  Blood cultures were sent in the emergency room.  He is been referred for admission.  Review of Systems: As per HPI otherwise 10 point review of systems negative.    Past Medical History:  Diagnosis Date  . AKI (acute kidney injury) ([place]) 04/04/2016  . Anemia   . Arthritis   . Asthma   . ETOH abuse   . Folate deficiency 04/05/2016  . Heme positive stool 04/05/2016  . Hypertension     Past Surgical History:  Procedure Laterality Date  .  COLONOSCOPY    . COLONOSCOPY WITH PROPOFOL N/A 03/02/2017   Procedure: COLONOSCOPY WITH PROPOFOL;  Surgeon: Lollie Sails, MD;  Location: [facility] ENDOSCOPY;  Service: Endoscopy;  Laterality: N/A;  . FRACTURE SURGERY    . KNEE ARTHROCENTESIS       reports that he has been smoking cigarettes.  He has been smoking about 1.00 pack per day. he has never used smokeless tobacco. He reports that he drinks alcohol. He reports that he does not use drugs.  No Known Allergies  Family History  Problem Relation Age of Onset  . Hypertension Mother   . Stroke Mother   . Hypertension Father     Prior to Admission medications   Medication Sig Start Date End Date Taking? Authorizing Provider  acetaminophen (TYLENOL) 325 MG tablet Take 650 mg by mouth every 6 (six) hours as needed.   Yes [provider]  albuterol (PROVENTIL HFA;VENTOLIN HFA) 108 (90 Base) MCG/ACT inhaler Inhale 2 puffs into the lungs every 6 (six) hours as needed for wheezing or shortness of breath. 04/06/16  Yes Rexene Alberts, MD  amLODipine (NORVASC) 10 MG tablet Take 10 mg by mouth at bedtime.   Yes [provider]  Cholecalciferol (VITAMIN D) 2000 units CAPS Take 1 capsule by mouth once a week.    Yes [provider]  ferrous sulfate 325 (65 FE) MG tablet Take 325 mg by mouth as directed. Take  1 tablet by mouth three times daily for 7 days. Then take 1 tablet twice daily for 7 days. Then take 1 tablet once daily. Started 12/16/2017   Yes [provider]  ibuprofen (ADVIL,MOTRIN) 600 MG tablet Take 1 tablet (600 mg total) by mouth every 6 (six) hours as needed. 08/21/17  Yes Sable Feil, PA-C  metoprolol succinate (TOPROL-XL) 50 MG 24 hr tablet Take 50 mg by mouth daily. Take with or immediately following a meal.   Yes [provider]    Physical Exam: Vitals:   12/20/17 1550 12/20/17 1600 12/20/17 1623 12/20/17 1700  BP: 128/81 118/72  114/64  Pulse: 77   86  Resp: 16 15  16   Temp:    98.7 F (37.1 C)   TempSrc:   Rectal   SpO2: 100%   100%  Weight:      Height:        Constitutional: NAD, calm, comfortable, mildly diaphoretic Vitals:   12/20/17 1550 12/20/17 1600 12/20/17 1623 12/20/17 1700  BP: 128/81 118/72  114/64  Pulse: 77   86  Resp: 16 15  16   Temp:   98.7 F (37.1 C)   TempSrc:   Rectal   SpO2: 100%   100%  Weight:      Height:       Eyes: PERRL, lids and conjunctivae normal ENMT: Mucous membranes are moist. Posterior pharynx clear of any exudate or lesions.Normal dentition.  Breath has a fruity odor. Neck: normal, supple, no masses, no thyromegaly Respiratory: clear to auscultation bilaterally, no wheezing, no crackles. Normal respiratory effort. No accessory muscle use.  Cardiovascular: Tachycardic, no murmurs / rubs / gallops. No extremity edema. 2+ pedal pulses. No carotid bruits.  Abdomen: no tenderness, no masses palpated. No hepatosplenomegaly. Bowel sounds positive.  Musculoskeletal: no clubbing / cyanosis. No joint deformity upper and lower extremities. Good ROM, no contractures. Normal muscle tone.  Skin: no rashes, lesions, ulcers. No induration Neurologic: CN 2-12 grossly intact. Sensation intact, DTR normal. Strength 5/5 in all 4.  Psychiatric: Normal judgment and insight. Alert and oriented x 3. Normal mood.   Labs on Admission: I have personally reviewed following labs and imaging studies  CBC: Recent Labs  Lab 12/20/17 1151 12/20/17 1211  WBC 3.9*  --   NEUTROABS 3.2  --   HGB 10.3* 13.6  HCT 32.6* 40.0  MCV 89.8  --   PLT 293  --    Basic Metabolic Panel: Recent Labs  Lab 12/20/17 1151 12/20/17 1211  NA 143 143  K 3.5 3.4*  CL 106 111  CO2 12*  --   GLUCOSE 29* 27*  BUN 20 18  CREATININE 0.97 1.00  CALCIUM 8.7*  --    GFR: Estimated Creatinine Clearance: 72.6 mL/min (by C-G formula based on SCr of 1 mg/dL). Liver Function Tests: Recent Labs  Lab 12/20/17 1151  AST 106*  ALT 29  ALKPHOS 34*  BILITOT 0.6    PROT 7.7  ALBUMIN 4.3   No results for input(s): LIPASE, AMYLASE in the last 168 hours. Recent Labs  Lab 12/20/17 1306  AMMONIA 31   Coagulation Profile: No results for input(s): INR, PROTIME in the last 168 hours. Cardiac Enzymes: Recent Labs  Lab 12/20/17 1151  TROPONINI <0.03   BNP (last 3 results) No results for input(s): PROBNP in the last 8760 hours. HbA1C: No results for input(s): HGBA1C in the last 72 hours. CBG: Recent Labs  Lab 12/20/17 1154 12/20/17 1217 12/20/17 1252  12/20/17 1531  GLUCAP 20* 215* 207* 153*   Lipid Profile: No results for input(s): CHOL, HDL, LDLCALC, TRIG, CHOLHDL, LDLDIRECT in the last 72 hours. Thyroid Function Tests: No results for input(s): TSH, T4TOTAL, FREET4, T3FREE, THYROIDAB in the last 72 hours. Anemia Panel: No results for input(s): VITAMINB12, FOLATE, FERRITIN, TIBC, IRON, RETICCTPCT in the last 72 hours. Urine analysis:    Component Value Date/Time   COLORURINE YELLOW 12/20/2017 1221   APPEARANCEUR CLEAR 12/20/2017 1221   LABSPEC 1.015 12/20/2017 1221   PHURINE 5.0 12/20/2017 1221   GLUCOSEU NEGATIVE 12/20/2017 1221   HGBUR SMALL (A) 12/20/2017 1221   BILIRUBINUR NEGATIVE 12/20/2017 1221   KETONESUR 20 (A) 12/20/2017 1221   PROTEINUR 30 (A) 12/20/2017 1221   NITRITE NEGATIVE 12/20/2017 1221   LEUKOCYTESUR NEGATIVE 12/20/2017 1221    Radiological Exams on Admission: Dg Chest 2 View  Result Date: 12/20/2017 CLINICAL DATA:  52 y/o M; shortness of breath with cough and wheezing. EXAM: CHEST  2 VIEW COMPARISON:  04/06/2016 chest radiograph FINDINGS: Stable heart size and mediastinal contours are within normal limits. Both lungs are clear. The visualized skeletal structures are unremarkable. IMPRESSION: No acute pulmonary process identified. Electronically Signed   By: Kristine Garbe M.D.   On: 12/20/2017 15:21    EKG: Independently reviewed.  Sinus rhythm with right bundle branch block, no acute  changes  Assessment/Plan Active Problems:   Alcohol abuse   Tobacco abuse   Alcoholic ketoacidosis   Lactic acidosis   Hypoglycemia   Acute metabolic encephalopathy   Hypothermia   HTN (hypertension)     1. Alcoholic ketoacidosis.  Patient is a heavy alcohol drinker.  He is noted to have elevated serum ketones.  He also has elevated lactic acid, likely related to hypothermia/hypoperfusion.  We will start the patient on dextrose infusion.  He is already received thiamine and folate.  Follow serial labs to monitor electrolytes. 2. Hypoglycemia.  Related to ketoacidosis.  Blood sugars have improved.  Continue to monitor blood sugars.  Check A1c.  Check TSH and cortisol.  Anticipate this should improve once he is regularly eating 3. Metabolic encephalopathy.  Related to hypoglycemia.  Now resolved since blood sugars have improved. 4. Hypertension.  Chronically on metoprolol and amlodipine.  We will hold for now since blood pressures are normotensive.  These are trending up, these medications can be restarted. 5. Alcohol abuse.  High risk of developing alcohol withdrawal.  Start on CIWA protocol with Ativan. 6. Tobacco use.  Will start on nicotine patch 7. Hypothermia.  Related to hypoglycemia.  Started on warming blanket.  Improved.  No evidence of sepsis at this time.  No clear focus of infection.  Blood cultures have been sent.  Will hold off on antibiotics right now. 8. Lactic acidosis.  Related to dehydration/ketoacidosis.  Improving with IV fluids.  No evidence of sepsis at this time.  Continue IV fluids and repeat labs.  DVT prophylaxis: lovenox Code Status: full code Family Communication: discussed with multiple family members at the bedside Disposition Plan: discharge home once improved Consults called:  Admission status: inpatient, stepdown   Kathie Dike MD [facility] Pager [phone]  If 7PM-7AM, please contact night-coverage [email or web address] Password  [email or web address]  12/20/2017, 6:30 PM

## 2017-12-20 NOTE — ED Notes (Signed)
Sister now at bedside. Sister states pt was normal yesterday at 430pm. When she called him this am , pt was confused and had trouble talking so brought him here.

## 2017-12-20 NOTE — ED Provider Notes (Addendum)
[facility] EMERGENCY DEPARTMENT Provider Note   CSN: [id number] Arrival date & time: 12/20/17  1137     History   Chief Complaint Chief Complaint  Patient presents with  . Altered Mental Status    HPI Wesley Francis is a 52 y.o. male.  HPI Pt presents to the ED for evaluation of confusion and altered mental status.  Family went to check on him today and they found that he was slow to answer questions and he seemed confused.  He seemed to have trouble talking so they thought he was short of breath.  Patient seemed normal yesterday at about 4:30 in the afternoon.  When I asked patient about alcohol use he indicated that he only drinks occasionally however the sister states he actually drinks daily and heavily.  Patient denies any specific complaints.  He is feeling chilled right now.  He denies any fevers.  No vomiting.  The sister states when they found him today he had been incontinent of stool.  Patient has a blood sugar checked here in the emergency room and it was 20.  Patient denies any history of diabetes or taking any medications for diabetes including insulin..  Prior to my evaluation the patient was given an amp of D50 Past Medical History:  Diagnosis Date  . AKI (acute kidney injury) ([place]) 04/04/2016  . Anemia   . Arthritis   . Asthma   . ETOH abuse   . Folate deficiency 04/05/2016  . Heme positive stool 04/05/2016  . Hypertension     Patient Active Problem List   Diagnosis Date Noted  . Alcoholic ketoacidosis 08/65/7846  . Alcohol intoxication ([place])   . Chest congestion   . Folate deficiency 04/05/2016  . Heme positive stool 04/05/2016  . AKI (acute kidney injury) ([place]) 04/04/2016  . Alcohol abuse 04/04/2016  . Tobacco abuse 04/04/2016  . Normocytic anemia 04/04/2016  . Chronic low back pain 04/04/2016  . Chronic leg pain 04/04/2016    Past Surgical History:  Procedure Laterality Date  . COLONOSCOPY    . COLONOSCOPY WITH PROPOFOL N/A 03/02/2017   Procedure: COLONOSCOPY WITH PROPOFOL;  Surgeon: Lollie Sails, MD;  Location: [facility] ENDOSCOPY;  Service: Endoscopy;  Laterality: N/A;  . FRACTURE SURGERY    . KNEE ARTHROCENTESIS         Home Medications    Prior to Admission medications   Medication Sig Start Date End Date Taking? Authorizing Provider  acetaminophen (TYLENOL) 325 MG tablet Take 650 mg by mouth every 6 (six) hours as needed.   Yes [provider]  albuterol (PROVENTIL HFA;VENTOLIN HFA) 108 (90 Base) MCG/ACT inhaler Inhale 2 puffs into the lungs every 6 (six) hours as needed for wheezing or shortness of breath. 04/06/16  Yes Rexene Alberts, MD  amLODipine (NORVASC) 10 MG tablet Take 10 mg by mouth at bedtime.   Yes [provider]  Cholecalciferol (VITAMIN D) 2000 units CAPS Take 1 capsule by mouth once a week.    Yes [provider]  ferrous sulfate 325 (65 FE) MG tablet Take 325 mg by mouth as directed. Take 1 tablet by mouth three times daily for 7 days. Then take 1 tablet twice daily for 7 days. Then take 1 tablet once daily. Started 12/16/2017   Yes [provider]  ibuprofen (ADVIL,MOTRIN) 600 MG tablet Take 1 tablet (600 mg total) by mouth every 6 (six) hours as needed. 08/21/17  Yes Sable Feil, PA-C  metoprolol succinate (TOPROL-XL) 50 MG  24 hr tablet Take 50 mg by mouth daily. Take with or immediately following a meal.   Yes [provider]  amoxicillin (AMOXIL) 500 MG capsule Take 1 capsule (500 mg total) by mouth 3 (three) times daily. Patient not taking: Reported on 12/20/2017 08/21/17   Sable Feil, PA-C  oxyCODONE-acetaminophen (ROXICET) 5-325 MG tablet Take 1 tablet by mouth every 6 (six) hours as needed for moderate pain. Patient not taking: Reported on 12/20/2017 08/21/17   Sable Feil, PA-C    Family History Family History  Problem Relation Age of Onset  . Hypertension Mother   . Stroke Mother   . Hypertension Father     Social History Social  History   Tobacco Use  . Smoking status: Current Every Day Smoker    Packs/day: 1.00    Types: Cigarettes  . Smokeless tobacco: Never Used  Substance Use Topics  . Alcohol use: Yes    Comment: yesterday liquor  . Drug use: No     Allergies   Patient has no known allergies.   Review of Systems Review of Systems  Constitutional: Negative for fever.  Respiratory: Positive for cough.   Gastrointestinal: Positive for diarrhea. Negative for abdominal pain and vomiting.  Genitourinary: Negative for dysuria.  Neurological: Negative for seizures and headaches.  Psychiatric/Behavioral: Positive for confusion.  All other systems reviewed and are negative.    Physical Exam Updated Vital Signs BP 128/69   Pulse 84   Temp (!) 93.1 F (33.9 C) (Rectal)   Resp 16   Ht 1.727 m (5\' 8" )   Wt 59.4 kg (131 lb)   SpO2 100%   BMI 19.92 kg/m   Physical Exam  Constitutional: No distress.  HENT:  Head: Normocephalic and atraumatic.  Right Ear: External ear normal.  Left Ear: External ear normal.  Eyes: Conjunctivae are normal. Right eye exhibits no discharge. Left eye exhibits no discharge. No scleral icterus.  Neck: Neck supple. No tracheal deviation present.  Cardiovascular: Normal rate, regular rhythm and intact distal pulses.  Pulmonary/Chest: Effort normal and breath sounds normal. No stridor. No respiratory distress. He has no wheezes. He has no rales.  Abdominal: Soft. Bowel sounds are normal. He exhibits no distension. There is no rebound and no guarding.  Musculoskeletal: He exhibits no edema or tenderness.  Neurological: He is alert. He has normal strength. He displays tremor. No cranial nerve deficit (no facial droop, extraocular movements intact, no slurred speech) or sensory deficit. He exhibits normal muscle tone. He displays no seizure activity. Coordination abnormal.  Patient is alert and awake and answering questions appropriately  Skin: Skin is warm and dry. No rash  noted. He is not diaphoretic.  Psychiatric: He has a normal mood and affect.  Nursing note and vitals reviewed.    ED Treatments / Results  Labs (all labs ordered are listed, but only abnormal results are displayed) Labs Reviewed  CBC WITH DIFFERENTIAL/PLATELET - Abnormal; Notable for the following components:      Result Value   WBC 3.9 (*)    RBC 3.63 (*)    Hemoglobin 10.3 (*)    HCT 32.6 (*)    RDW 22.0 (*)    Lymphs Abs 0.5 (*)    All other components within normal limits  COMPREHENSIVE METABOLIC PANEL - Abnormal; Notable for the following components:   CO2 12 (*)    Glucose, Bld 29 (*)    Calcium 8.7 (*)    AST 106 (*)  Alkaline Phosphatase 34 (*)    Anion gap 25 (*)    All other components within normal limits  ETHANOL - Abnormal; Notable for the following components:   Alcohol, Ethyl (B) 174 (*)    All other components within normal limits  URINALYSIS, ROUTINE W REFLEX MICROSCOPIC - Abnormal; Notable for the following components:   Hgb urine dipstick SMALL (*)    Ketones, ur 20 (*)    Protein, ur 30 (*)    Squamous Epithelial / LPF 0-5 (*)    All other components within normal limits  BLOOD GAS, VENOUS - Abnormal; Notable for the following components:   pH, Ven 7.125 (*)    pCO2, Ven 33.4 (*)    pO2, Ven 63.6 (*)    Bicarbonate 11.2 (*)    Acid-base deficit 16.9 (*)    All other components within normal limits  LACTIC ACID, PLASMA - Abnormal; Notable for the following components:   Lactic Acid, Venous 11.0 (*)    All other components within normal limits  CBG MONITORING, ED - Abnormal; Notable for the following components:   Glucose-Capillary 20 (*)    All other components within normal limits  I-STAT CHEM 8, ED - Abnormal; Notable for the following components:   Potassium 3.4 (*)    Glucose, Bld 27 (*)    Calcium, Ion 1.04 (*)    TCO2 14 (*)    All other components within normal limits  CBG MONITORING, ED - Abnormal; Notable for the following  components:   Glucose-Capillary 215 (*)    All other components within normal limits  CBG MONITORING, ED - Abnormal; Notable for the following components:   Glucose-Capillary 207 (*)    All other components within normal limits  CULTURE, BLOOD (ROUTINE X 2)  CULTURE, BLOOD (ROUTINE X 2)  URINE CULTURE  TROPONIN I  RAPID URINE DRUG SCREEN, HOSP PERFORMED  AMMONIA  LACTIC ACID, PLASMA  SALICYLATE LEVEL  ACETAMINOPHEN LEVEL  BETA-HYDROXYBUTYRIC ACID  CBG MONITORING, ED  CBG MONITORING, ED  CBG MONITORING, ED    EKG  EKG Interpretation  Date/Time:  Monday December 20 2017 11:57:00 EST Ventricular Rate:  57 PR Interval:  186 QRS Duration: 126 QT Interval:  546 QTC Calculation: 531 R Axis:   -53 Text Interpretation:  Sinus bradycardia Left axis deviation Right bundle branch block Anteroseptal infarct , age undetermined Abnormal ECG No previous tracing Confirmed by Dorie Rank [phone]) on 12/20/2017 12:02:33 PM       Radiology No results found.  Procedures .Critical Care Performed by: Dorie Rank, MD Authorized by: Dorie Rank, MD   Critical care provider statement:    Critical care time (minutes):  40   Critical care was necessary to treat or prevent imminent or life-threatening deterioration of the following conditions:  Metabolic crisis   Critical care was time spent personally by me on the following activities:  Discussions with consultants, evaluation of patient's response to treatment, examination of patient, ordering and performing treatments and interventions, ordering and review of laboratory studies, ordering and review of radiographic studies, pulse oximetry, re-evaluation of patient's condition, obtaining history from patient or surrogate and review of old charts   (including critical care time)  Medications Ordered in ED Medications  dextrose 5 % and 0.9 % NaCl with KCl 20 mEq/L infusion ( Intravenous New Bag/Given 12/20/17 1257)  dextrose 50 % solution (  Given  by Other 12/20/17 1201)  dextrose 50 % solution 50 mL (50 mLs Intravenous Given 12/20/17 1206)  folic acid injection 1 mg (1 mg Intravenous Given 12/20/17 1423)  thiamine (B-1) injection 100 mg (100 mg Intravenous Given 12/20/17 1423)     Initial Impression / Assessment and Plan / ED Course  I have reviewed the triage vital signs and the nursing notes.  Pertinent labs & imaging results that were available during my care of the patient were reviewed by me and considered in my medical decision making (see chart for details).  Clinical Course as of Dec 20 1433  Mon Dec 20, 2017  1352 Labs consistent with a metabolic acidosis.  Anion gal is elevated.  Not related to uremia.  No history to suggest toxic alcohol ingestion, ethanol is elevated so likely related to his etoh use, alcoholic ketoacidosis or lactic acidosis.  [FK]  8127 Notified about his elevated lactic acid level.  Suspect this is related to his alcohol use and dehydration.  Doubt sepsis at this time.   [JK]    Clinical Course User Index [JK] Dorie Rank, MD    he presented to the emergency room for altered mental status.  Patient was noted to be significantly hypoglycemic and hypothermic in the emergency room.  Patient's mental status improved with glucose.  Patient's temperature is improving with sternal warming.  His vital signs have remained stable.  Patient's laboratory tests are notable for a metabolic acidosis that I suspect is related to alcoholic ketoacidosis and possibly lactic acidosis associated with alcohol use.  Patient has been started on IV fluids.  Chest x-ray and UA are pending but I doubt sepsis.  We will need to monitor closely for signs of infection.  I have also ordered a dextrose infusion.  I have ordered thiamine and folic acid IV.  We will continue with IV fluid hydration.  I will consult with the medical service for admission and further treatment.  Final Clinical Impressions(s) / ED Diagnoses   Final diagnoses:    Alcoholic ketoacidosis  Hypoglycemia     Dorie Rank, MD 12/20/17 1402   Ed course addendum   Dorie Rank, MD 12/20/17 1435

## 2017-12-21 LAB — CORTISOL-AM, BLOOD: Cortisol - AM: 14.9 ug/dL (ref 6.7–22.6)

## 2017-12-21 LAB — HEMOGLOBIN A1C
HEMOGLOBIN A1C: 4.9 % (ref 4.8–5.6)
MEAN PLASMA GLUCOSE: 93.93 mg/dL

## 2017-12-21 LAB — CBC
HEMATOCRIT: 28.4 % — AB (ref 39.0–52.0)
HEMOGLOBIN: 9.3 g/dL — AB (ref 13.0–17.0)
MCH: 29.1 pg (ref 26.0–34.0)
MCHC: 32.7 g/dL (ref 30.0–36.0)
MCV: 88.8 fL (ref 78.0–100.0)
Platelets: 260 10*3/uL (ref 150–400)
RBC: 3.2 MIL/uL — ABNORMAL LOW (ref 4.22–5.81)
RDW: 22.2 % — AB (ref 11.5–15.5)
WBC: 5.5 10*3/uL (ref 4.0–10.5)

## 2017-12-21 LAB — COMPREHENSIVE METABOLIC PANEL
ALT: 20 U/L (ref 17–63)
ANION GAP: 10 (ref 5–15)
AST: 44 U/L — AB (ref 15–41)
Albumin: 3.6 g/dL (ref 3.5–5.0)
Alkaline Phosphatase: 26 U/L — ABNORMAL LOW (ref 38–126)
BILIRUBIN TOTAL: 0.9 mg/dL (ref 0.3–1.2)
BUN: 19 mg/dL (ref 6–20)
CHLORIDE: 103 mmol/L (ref 101–111)
CO2: 23 mmol/L (ref 22–32)
Calcium: 8.7 mg/dL — ABNORMAL LOW (ref 8.9–10.3)
Creatinine, Ser: 0.92 mg/dL (ref 0.61–1.24)
GFR calc Af Amer: >60 mL/min (ref >60)
GFR calc non Af Amer: >60 mL/min (ref >60)
Glucose, Bld: 104 mg/dL — ABNORMAL HIGH (ref 65–99)
POTASSIUM: 4.2 mmol/L (ref 3.5–5.1)
Sodium: 136 mmol/L (ref 135–145)
TOTAL PROTEIN: 6.5 g/dL (ref 6.5–8.1)

## 2017-12-21 LAB — GLUCOSE, CAPILLARY
Glucose-Capillary: 109 mg/dL — ABNORMAL HIGH (ref 65–99)
Glucose-Capillary: 113 mg/dL — ABNORMAL HIGH (ref 65–99)
Glucose-Capillary: 131 mg/dL — ABNORMAL HIGH (ref 65–99)
Glucose-Capillary: 145 mg/dL — ABNORMAL HIGH (ref 65–99)
Glucose-Capillary: 72 mg/dL (ref 65–99)

## 2017-12-21 LAB — PROTIME-INR
INR: 1.13
Prothrombin Time: 14.4 seconds (ref 11.4–15.2)

## 2017-12-21 LAB — HIV ANTIBODY (ROUTINE TESTING W REFLEX): HIV Screen 4th Generation wRfx: NONREACTIVE

## 2017-12-21 LAB — LACTIC ACID, PLASMA: LACTIC ACID, VENOUS: 1.3 mmol/L (ref 0.5–1.9)

## 2017-12-21 LAB — MAGNESIUM: Magnesium: 2.5 mg/dL — ABNORMAL HIGH (ref 1.7–2.4)

## 2017-12-21 LAB — PHOSPHORUS: PHOSPHORUS: 1.2 mg/dL — AB (ref 2.5–4.6)

## 2017-12-21 MED ORDER — METOPROLOL SUCCINATE ER 50 MG PO TB24
50.0000 mg | ORAL_TABLET | Freq: Every day | ORAL | Status: DC
  Administered 2017-12-21 – 2017-12-22 (×2): 50 mg via ORAL
  Filled 2017-12-21 (×2): qty 1

## 2017-12-21 MED ORDER — K PHOS MONO-SOD PHOS DI & MONO 155-852-130 MG PO TABS
250.0000 mg | ORAL_TABLET | Freq: Three times a day (TID) | ORAL | Status: DC
  Administered 2017-12-21 – 2017-12-22 (×4): 250 mg via ORAL
  Filled 2017-12-21 (×13): qty 1

## 2017-12-21 NOTE — Care Management Note (Signed)
Case Management Note  Patient Details  Name: Wesley Francis MRN: [id number] Date of Birth: 1966/05/18  Subjective/Objective: Adm with alcoholic ketoacidosis. Spoke with patient sister  for discharge plan. Patient is from home with 24 hours supervisions by the family who patient states takes turns staying with the him .   Action/Plan: CSW working with patient on potential detox program. CM will follow for needs should be DC home.   Expected Discharge Date:       12/23/2016           Expected Discharge Plan:  IP Rehab Facility(dextox treatment)  In-House Referral:     Discharge planning Services     Post Acute Care Choice:    Choice offered to:  Patient, Sibling  DME Arranged:    DME Agency:     HH Arranged:    [place] Agency:     Status of Service:  Completed, signed off  If discussed at [employer] of Stay Meetings, dates discussed:    Additional Comments:  Mariell Nester, Chauncey Reading, RN 12/21/2017, 3:09 PM

## 2017-12-21 NOTE — Clinical Social Work Note (Signed)
Received referral to see pt for AODA issues. Met with pt and his sister (present with pt's permission) to assess. SBIRT completed. Pt says he is interested in detox and treatment. Discussed options. Referral made to [facility] in [facility] but they feel that pt's medical needs are too complex for their program. They suggest [facility] in [place]. MD states pt needs hospitalization over night.   Will follow to assist with dc planning.

## 2017-12-21 NOTE — Progress Notes (Signed)
PROGRESS NOTE    Wesley Francis  VQQ:[id number] DOB: 21-Jun-1966 DOA: 12/20/2017 PCP: Jerel Shepherd, FNP    Brief Narrative:  52 year old male with a history of alcohol abuse, was brought to the hospital with altered mental status.  He was noted to be in alcoholic ketoacidosis, was hypothermic with a temperature of 88 and a blood sugar of 20.  Lactic acid was markedly elevated at 11.  Serum bicarbonate was low at 12.  He was started on IV hydration with bicarbonate and dextrose.  His blood sugars have since improved.  Acidosis has resolved.  Temperature is now normal.  Patient is interested in alcohol cessation.  He is developing some alcohol withdrawal.  He will be monitored in the hospital overnight if he does not develop severe withdrawal, anticipate possible discharge tomorrow.  Social work is assisting with possible placement in alcohol rehab program.   Assessment & Plan:   Active Problems:   Alcohol abuse   Tobacco abuse   Alcoholic ketoacidosis   Lactic acidosis   Hypoglycemia   Acute metabolic encephalopathy   Hypothermia   HTN (hypertension)   1. Alcoholic ketoacidosis.  Patient is a heavy alcohol drinker.  He was noted to have elevated serum ketones.  He also had elevated lactic acid, likely related to hypothermia/hypoperfusion.    Patient was treated with  dextrose infusion.  He also received thiamine and folate.  Acidosis has resolved.  Will discontinue IV fluids. 2. Hypoglycemia.  Related to ketoacidosis.  Blood sugars have improved.  Continue to monitor blood sugars. A1c 4.9 and cortisol normal at 14.9. Dextrose infusion has been discontinued.  Monitor blood sugars today to ensure they are stable. 3. Metabolic encephalopathy.  Related to hypoglycemia.  Now resolved since blood sugars have improved. 4. Hypertension.  Chronically on metoprolol and amlodipine. Blood pressures were low on arrival.  Since they are improving, will restart on Toprol.  Continue to hold amlodipine  for now. 5. Alcohol abuse.    Started on CIWA protocol with Ativan.  He is having some tremors and diaphoresis.  Monitor overnight to ensure that he does not develop severe withdrawal 6. Tobacco use.  Will start on nicotine patch 7. Hypothermia.  Related to hypoglycemia.  Started on warming blanket.  Improved.  No evidence of sepsis at this time.  No clear focus of infection.  Blood cultures have been sent.  Will hold off on antibiotics right now. 8. Lactic acidosis.  Related to dehydration/ketoacidosis.  Resolved with IV fluids.  No evidence of sepsis at this time.    DVT prophylaxis: Lovenox Code Status: Full code Family Communication: Discussed with sister at the bedside Disposition Plan: Possible placement in residential detox program if bed is available   Consultants:     Procedures:     Antimicrobials:       Subjective: Feeling better today.  Feels less weak today.  He is a little shaky.  Objective: Vitals:   12/21/17 1000 12/21/17 1249 12/21/17 1700 12/21/17 1800  BP: 129/89  115/75 (!) 108/93  Pulse:    (!) 115  Resp: 16  20 (!) 24  Temp:  98.5 F (36.9 C)    TempSrc:  Oral    SpO2:      Weight:      Height:        Intake/Output Summary (Last 24 hours) at 12/21/2017 1813 Last data filed at 12/21/2017 1800 Gross per 24 hour  Intake 306.67 ml  Output 2450 ml  Net -2143.33  ml   Filed Weights   12/20/17 1150 12/21/17 0400  Weight: 59.4 kg (131 lb) 62.3 kg (137 lb 5.6 oz)    Examination:  General exam: Appears calm and comfortable, diaphoretic Respiratory system: Clear to auscultation. Respiratory effort normal. Cardiovascular system: S1 & S2 heard, tachycardic. No JVD, murmurs, rubs, gallops or clicks. No pedal edema. Gastrointestinal system: Abdomen is nondistended, soft and nontender. No organomegaly or masses felt. Normal bowel sounds heard. Central nervous system: Alert and oriented. No focal neurological deficits.  Mildly tremulous Extremities:  Symmetric 5 x 5 power. Skin: No rashes, lesions or ulcers Psychiatry: Judgement and insight appear normal. Mood & affect appropriate.     Data Reviewed: I have personally reviewed following labs and imaging studies  CBC: Recent Labs  Lab 12/20/17 1151 12/20/17 1211 12/20/17 2006 12/21/17 0408  WBC 3.9*  --  4.7 5.5  NEUTROABS 3.2  --   --   --   HGB 10.3* 13.6 8.8* 9.3*  HCT 32.6* 40.0 27.5* 28.4*  MCV 89.8  --  89.0 88.8  PLT 293  --  223 245   Basic Metabolic Panel: Recent Labs  Lab 12/20/17 1151 12/20/17 1211 12/20/17 1814 12/21/17 0408  NA 143 143 139 136  K 3.5 3.4* 4.1 4.2  CL 106 111 106 103  CO2 12*  --  13* 23  GLUCOSE 29* 27* 77 104*  BUN 20 18 21* 19  CREATININE 0.97 1.00 0.98 0.92  CALCIUM 8.7*  --  8.4* 8.7*  MG  --   --  1.4* 2.5*  PHOS  --   --  2.4* 1.2*   GFR: Estimated Creatinine Clearance: 82.8 mL/min (by C-G formula based on SCr of 0.92 mg/dL). Liver Function Tests: Recent Labs  Lab 12/20/17 1151 12/21/17 0408  AST 106* 44*  ALT 29 20  ALKPHOS 34* 26*  BILITOT 0.6 0.9  PROT 7.7 6.5  ALBUMIN 4.3 3.6   No results for input(s): LIPASE, AMYLASE in the last 168 hours. Recent Labs  Lab 12/20/17 1306  AMMONIA 31   Coagulation Profile: Recent Labs  Lab 12/21/17 0408  INR 1.13   Cardiac Enzymes: Recent Labs  Lab 12/20/17 1151  TROPONINI <0.03   BNP (last 3 results) No results for input(s): PROBNP in the last 8760 hours. HbA1C: Recent Labs    12/20/17 1814  HGBA1C 4.9   CBG: Recent Labs  Lab 12/20/17 2042 12/20/17 2334 12/21/17 0435 12/21/17 0820 12/21/17 1248  GLUCAP 64* 78 109* 113* 131*   Lipid Profile: No results for input(s): CHOL, HDL, LDLCALC, TRIG, CHOLHDL, LDLDIRECT in the last 72 hours. Thyroid Function Tests: Recent Labs    12/20/17 1304  TSH 0.185*   Anemia Panel: No results for input(s): VITAMINB12, FOLATE, FERRITIN, TIBC, IRON, RETICCTPCT in the last 72 hours. Sepsis Labs: Recent Labs  Lab  12/20/17 1306 12/20/17 1659 12/21/17 0408  LATICACIDVEN 11.0* 9.2* 1.3    Recent Results (from the past 240 hour(s))  Blood Cultures (routine x 2)     Status: None (Preliminary result)   Collection Time: 12/20/17  1:06 PM  Result Value Ref Range Status   Specimen Description BLOOD RIGHT ARM  Final   Special Requests   Final    BOTTLES DRAWN AEROBIC AND ANAEROBIC Blood Culture adequate volume   Culture NO GROWTH < 24 HOURS  Final   Report Status PENDING  Incomplete  Blood Cultures (routine x 2)     Status: None (Preliminary result)   Collection Time:  12/20/17  1:11 PM  Result Value Ref Range Status   Specimen Description BLOOD RIGHT FOREARM  Final   Special Requests   Final    BOTTLES DRAWN AEROBIC AND ANAEROBIC Blood Culture adequate volume   Culture NO GROWTH < 24 HOURS  Final   Report Status PENDING  Incomplete  MRSA PCR Screening     Status: None   Collection Time: 12/20/17  5:43 PM  Result Value Ref Range Status   MRSA by PCR NEGATIVE NEGATIVE Final    Comment:        The GeneXpert MRSA Assay (FDA approved for NASAL specimens only), is one component of a comprehensive MRSA colonization surveillance program. It is not intended to diagnose MRSA infection nor to guide or monitor treatment for MRSA infections.          Radiology Studies: Dg Chest 2 View  Result Date: 12/20/2017 CLINICAL DATA:  52 y/o M; shortness of breath with cough and wheezing. EXAM: CHEST  2 VIEW COMPARISON:  04/06/2016 chest radiograph FINDINGS: Stable heart size and mediastinal contours are within normal limits. Both lungs are clear. The visualized skeletal structures are unremarkable. IMPRESSION: No acute pulmonary process identified. Electronically Signed   By: Kristine Garbe M.D.   On: 12/20/2017 15:21        Scheduled Meds: . enoxaparin (LOVENOX) injection  40 mg Subcutaneous Q24H  . folic acid  1 mg Intravenous Daily  . Influenza vac split quadrivalent PF  0.5 mL  Intramuscular Tomorrow-1000  . metoprolol succinate  50 mg Oral Daily  . nicotine  21 mg Transdermal Daily  . phosphorus  250 mg Oral TID WC & HS  . thiamine  100 mg Oral Daily   Continuous Infusions:   LOS: 1 day    Time spent: 75mins    Kathie Dike, MD [facility] Pager [phone]  If 7PM-7AM, please contact night-coverage [email or web address] Password [facility] 12/21/2017, 6:13 PM

## 2017-12-22 DIAGNOSIS — G9341 Metabolic encephalopathy: Secondary | ICD-10-CM

## 2017-12-22 DIAGNOSIS — F1010 Alcohol abuse, uncomplicated: Secondary | ICD-10-CM

## 2017-12-22 LAB — GLUCOSE, CAPILLARY
GLUCOSE-CAPILLARY: 103 mg/dL — AB (ref 65–99)
Glucose-Capillary: 111 mg/dL — ABNORMAL HIGH (ref 65–99)
Glucose-Capillary: 112 mg/dL — ABNORMAL HIGH (ref 65–99)
Glucose-Capillary: 97 mg/dL (ref 65–99)

## 2017-12-22 LAB — COMPREHENSIVE METABOLIC PANEL
ALK PHOS: 28 U/L — AB (ref 38–126)
ALT: 18 U/L (ref 17–63)
AST: 30 U/L (ref 15–41)
Albumin: 3.4 g/dL — ABNORMAL LOW (ref 3.5–5.0)
Anion gap: 10 (ref 5–15)
BUN: 16 mg/dL (ref 6–20)
CO2: 26 mmol/L (ref 22–32)
CREATININE: 0.94 mg/dL (ref 0.61–1.24)
Calcium: 9.1 mg/dL (ref 8.9–10.3)
Chloride: 103 mmol/L (ref 101–111)
GFR calc Af Amer: >60 mL/min (ref >60)
Glucose, Bld: 89 mg/dL (ref 65–99)
Potassium: 3.6 mmol/L (ref 3.5–5.1)
SODIUM: 139 mmol/L (ref 135–145)
Total Bilirubin: 0.6 mg/dL (ref 0.3–1.2)
Total Protein: 6.4 g/dL — ABNORMAL LOW (ref 6.5–8.1)

## 2017-12-22 LAB — URINE CULTURE

## 2017-12-22 LAB — CBC
HCT: 30.8 % — ABNORMAL LOW (ref 39.0–52.0)
Hemoglobin: 9.7 g/dL — ABNORMAL LOW (ref 13.0–17.0)
MCH: 27.9 pg (ref 26.0–34.0)
MCHC: 31.5 g/dL (ref 30.0–36.0)
MCV: 88.5 fL (ref 78.0–100.0)
Platelets: 249 10*3/uL (ref 150–400)
RBC: 3.48 MIL/uL — ABNORMAL LOW (ref 4.22–5.81)
RDW: 22.1 % — AB (ref 11.5–15.5)
WBC: 4.7 10*3/uL (ref 4.0–10.5)

## 2017-12-22 LAB — PHOSPHORUS: Phosphorus: 3.6 mg/dL (ref 2.5–4.6)

## 2017-12-22 MED ORDER — NICOTINE 21 MG/24HR TD PT24: 21.0000 mg | MEDICATED_PATCH | Freq: Every day | TRANSDERMAL | 0 refills | Status: DC

## 2017-12-22 MED ORDER — THIAMINE HCL 100 MG PO TABS: 100.0000 mg | ORAL_TABLET | Freq: Every day | ORAL | 0 refills | Status: DC

## 2017-12-22 NOTE — Clinical Social Work Note (Addendum)
LCSW following. Received return call from [facility] at [facility] in [facility] stating that they can meet pt's needs for detox if he would like to present for an intake assessment. Per Francee Piccolo, they are available 24/7.   Met with pt and his sister, Wesley Francis, to update. They express being agreeable. Wesley Francis asks for printed directions to [place]. SW followed up with this request and then Wesley Francis was indicating to pt's RN that she wanted the MD paged to find out if they could bring pt home tonight and then transport him to [place] tomorrow. When asked what the barrier would be to bringing pt today, Wesley Francis stated that she doesn't feel that she can manage the directions to transport him and that the sister who can manage the directions doesn't get off of work until 3:00. Explained to Wesley Francis that [facility] can admit pt at any time so if they want to take him after 3:00 that would be just fine.  Wesley Francis indicated that she needed to speak with her sister again to confirm if she can do this. Wesley Francis asking several times if pt could go home from the hospital and then go to [place] in the AM. RN and LCSW encouraged Wesley Francis and family to help pt get to [place] today. Ultimately, it is pt/family responsibility to follow up on these recommendations.   Updated Dr. Manuella Ghazi who states that pt is stable for dc. If pt/family choose to wait until tomorrow to bring pt to [place], that is their choice but it is not what we advise.   RN, pt, and pt's sister, Wesley Francis, updated. LCSW will be available if there are any other SW needs.    Had another conversation with pt's sister and she states that her sister who can drive has arranged to get off of work at 1:00 and she and Wesley Francis will take pt to [place] together right after he is discharged. Emotional support offered to Wesley Francis who is tearful about the situation.

## 2017-12-22 NOTE — Progress Notes (Addendum)
Extensive education provided to both patient and sister Mardene Celeste concerning necessity to take patient to [facility] in [place] as soon as possible. Sister, Mardene Celeste expressed concern about not being good with directions and states that her sister would be the one that can take patient to [facility] in [place], however the sister is currently at work and does not get off until 3 pm. Social work, Market researcher provided directions and instructions to [employer] at [facility]. Offered patient and sister Mardene Celeste to discharge patient home at 2:30 p.m.  To give family enough time to get home and pack patients belongings and to give sister enough time to get home at take patient to [facility].  Sherron Flemings, LCSW present during conversation and will update Dr. Manuella Ghazi

## 2017-12-22 NOTE — Discharge Instructions (Signed)
Follow-up with

## 2017-12-22 NOTE — Progress Notes (Signed)
Informed by patient's sister, Mardene Celeste, that the sister who will take him to [facility] got off work early. Education given to both patient and sister before discharge.

## 2017-12-22 NOTE — Clinical Social Work Note (Addendum)
Clinical Social Work Assessment  Patient Details  Name: Wesley Francis MRN: [id number] Date of Birth: 20-Dec-1965  Date of referral:  12/21/17               Reason for consult:  Substance Use/ETOH Abuse                Permission sought to share information with:  Chartered certified accountant granted to share information::  Yes, Verbal Permission Granted  Name::        Agency::  [facility], [facility]  Relationship::     Contact Information:     Housing/Transportation Living arrangements for the past 2 months:  Single Family Home Source of Information:  Patient, Siblings Patient Interpreter Needed:  None Criminal Activity/Legal Involvement Pertinent to Current Situation/Hospitalization:  No - Comment as needed Significant Relationships:  Siblings Lives with:  Self Do you feel safe going back to the place where you live?  Yes Need for family participation in patient care:  Yes (Comment)  Care giving concerns: Pt's sister expresses concern about pt's ETOH use and that pt doesn't eat or hydrate when he has been heavily drinking.   Social Worker assessment / plan: Met with pt yesterday, 12/21/17, after receiving consult to see pt for AODA concerns. Pt's sister was present during assessment with pt's permission. Pt resides alone in his home in [place]. Pt is disabled and has [employer]. Pt has been independent in ADLs but he does not drive. Pt's family members take him to the store or to appointments. Pt reports that he drinks whiskey on a daily basis. He sometimes drinks it straight other times he mixes it with cola. Per pt, he does not think that he has a "drinking problem". Pt states he started drinking whiskey in his early twenties. He states that he has never tried to stop drinking either on his own or with professional treatment.   Completed the SBIRT with pt. Total score was 19. Reviewed with pt the treatment recommendations based on his results. Pt's sister  expresses great concern that she found him the day of admission and his blood sugar was 20 when he arrived at the hospital. Pt's sister states that the ED RN told her that pt could have died that day if no one had found him and brought him to the hospital. Pt's sister is encouraging pt to go to inpatient detox and then begin AODA treatment.  Discussed with pt his desire for treatment. Pt states that he wants to stop drinking. He states that he wants to live. Provided verbal and written information to pt on ETOH dependence/abuse as well as physical effects on the body. Provided verbal information about treatment and expectations. Pt's sister informs LCSW that pt has a good friend and also a brother that recently passed away. Emotional support provided to pt and his sister.   Referral sent to [facility] in [facility] yesterday but they indicated that they are a non medical detox center and they felt pt needed medical detox. This AM, spoke with pt and his sister again about other options. Referral sent to [facility] in [place]. Will follow up with pt once return call received from [facility].  Employment status:  Disabled (Comment on whether or not currently receiving Disability) Insurance information:  Medicaid In [place] PT Recommendations:  Not assessed at this time Information / Referral to community resources:  Outpatient Substance Abuse Treatment Options  Patient/Family's Response to care: Pt and family appear accepting  of care.  Patient/Family's Understanding of and Emotional Response to Diagnosis, Current Treatment, and Prognosis: Pt and his sister appear to have a good understanding of pt's diagnosis and treatment recommendations. Pt is briefly tearful when discussing his current ETOH dependence. Emotional support provided.  Emotional Assessment Appearance:  Appears stated age Attitude/Demeanor/Rapport:    Affect (typically observed):  Flat, Quiet Orientation:  Oriented to Self, Oriented  to Place, Oriented to  Time, Oriented to Situation Alcohol / Substance use:  Alcohol Use Psych involvement (Current and /or in the community):  No (Comment)  Discharge Needs  Concerns to be addressed:  Substance Abuse Concerns Readmission within the last 30 days:  No Current discharge risk:  Lives alone, Substance Abuse Barriers to Discharge:  Active Substance Use   Shade Flood, LCSW 12/22/2017, 10:31 AM

## 2017-12-22 NOTE — Discharge Summary (Signed)
Physician Discharge Summary  Wesley Francis OFB:[id number] DOB: 01/08/1966 DOA: 12/20/2017  PCP: Jerel Shepherd, FNP  Admit date: 12/20/2017  Discharge date: 12/22/2017  Admitted From:Home  Disposition:  Home with evaluation at [facility] in [place] for Medical Detox later today. Sister will transport him to this facility for evaluation.  Recommendations for Outpatient Follow-up:  1. Follow up with PCP in 2 weeks  Home Health:N/A  Equipment/Devices:None  Discharge Condition:Stable  CODE STATUS: Full  Diet recommendation: Heart Healthy  Brief/Interim Summary:  1. Alcoholic ketoacidosis secondary to heavy ETOH abuse.  Patient underwent treatment with IV fluid to include dextrose as well as thiamine and folic acid.  He was maintained on CIWA protocol while here and has had no acute events during this admission.  He has no signs of DT and has stable vital signs and is considered to be stable for discharge.  Social workers have arranged for patient to follow-up for medical detox at [facility] and he will present there today for further evaluation. 2. Hypoglycemia. Related to ketoacidosis. Blood sugars have improved. Continue to monitor blood sugars. A1c 4.9 and cortisol normal at 14.9. Dextrose infusion has been discontinued.  3. Metabolic encephalopathy. Related to hypoglycemia. Now resolved since blood sugars have improved. 4. Hypertension. Chronically on metoprolol and amlodipine. Blood pressures were low on arrival.  Since they are improving, will continue on Toprol.  Continue to hold amlodipine for now. 5. Tobacco use. Will start on nicotine patch 6. Hypothermia secondary to hypoglycemia-resolved. 7. Lactic acidosis related to dehydration/ketoacidosis-resolved.   Discharge Diagnoses:  Active Problems:   Alcohol abuse   Tobacco abuse   Alcoholic ketoacidosis   Lactic acidosis   Hypoglycemia   Acute metabolic encephalopathy   Hypothermia   HTN  (hypertension)    Discharge Instructions  Discharge Instructions    Call MD for:  difficulty breathing, headache or visual disturbances   Complete by:  As directed    Call MD for:  persistant dizziness or light-headedness   Complete by:  As directed    Call MD for:  persistant nausea and vomiting   Complete by:  As directed    Call MD for:  severe uncontrolled pain   Complete by:  As directed    Diet - low sodium heart healthy   Complete by:  As directed    Increase activity slowly   Complete by:  As directed      Allergies as of 12/22/2017   No Known Allergies     Medication List    STOP taking these medications   amLODipine 10 MG tablet Commonly known as:  NORVASC     TAKE these medications   acetaminophen 325 MG tablet Commonly known as:  TYLENOL Take 650 mg by mouth every 6 (six) hours as needed.   albuterol 108 (90 Base) MCG/ACT inhaler Commonly known as:  PROVENTIL HFA;VENTOLIN HFA Inhale 2 puffs into the lungs every 6 (six) hours as needed for wheezing or shortness of breath.   ferrous sulfate 325 (65 FE) MG tablet Take 325 mg by mouth as directed. Take 1 tablet by mouth three times daily for 7 days. Then take 1 tablet twice daily for 7 days. Then take 1 tablet once daily. Started 12/16/2017   ibuprofen 600 MG tablet Commonly known as:  ADVIL,MOTRIN Take 1 tablet (600 mg total) by mouth every 6 (six) hours as needed.   metoprolol succinate 50 MG 24 hr tablet Commonly known as:  TOPROL-XL Take 50 mg  by mouth daily. Take with or immediately following a meal.   nicotine 21 mg/24hr patch Commonly known as:  NICODERM CQ - dosed in mg/24 hours Place 1 patch (21 mg total) onto the skin daily. Start taking on:  12/23/2017   thiamine 100 MG tablet Take 1 tablet (100 mg total) by mouth daily. Start taking on:  12/23/2017   Vitamin D 2000 units Caps Take 1 capsule by mouth once a week.      Follow-up Information    Cobb, Bunnie Pion, FNP Follow up in 2  week(s).   Specialty:  Family Medicine Contact information: [address] [place] [address] [phone]          No Known Allergies  Consultations:  None   Procedures/Studies: Dg Chest 2 View  Result Date: 12/20/2017 CLINICAL DATA:  52 y/o M; shortness of breath with cough and wheezing. EXAM: CHEST  2 VIEW COMPARISON:  04/06/2016 chest radiograph FINDINGS: Stable heart size and mediastinal contours are within normal limits. Both lungs are clear. The visualized skeletal structures are unremarkable. IMPRESSION: No acute pulmonary process identified. Electronically Signed   By: Kristine Garbe M.D.   On: 12/20/2017 15:21     Discharge Exam: Vitals:   12/22/17 0800 12/22/17 1020  BP:  116/85  Pulse:    Resp:  16  Temp: 98.1 F (36.7 C)   SpO2:  99%   Vitals:   12/22/17 0600 12/22/17 0700 12/22/17 0800 12/22/17 1020  BP: (!) 114/92 (!) 114/93  116/85  Pulse: 82 72    Resp: 16 17  16   Temp:   98.1 F (36.7 C)   TempSrc:   Oral   SpO2: 97% 97%  99%  Weight:      Height:        General: Pt is alert, awake, not in acute distress Cardiovascular: RRR, S1/S2 +, no rubs, no gallops Respiratory: CTA bilaterally, no wheezing, no rhonchi Abdominal: Soft, NT, ND, bowel sounds + Extremities: no edema, no cyanosis    The results of significant diagnostics from this hospitalization (including imaging, microbiology, ancillary and laboratory) are listed below for reference.     Microbiology: Recent Results (from the past 240 hour(s))  Urine Culture     Status: Abnormal   Collection Time: 12/20/17 12:21 PM  Result Value Ref Range Status   Specimen Description URINE, CLEAN CATCH  Final   Special Requests NONE  Final   Culture MULTIPLE SPECIES PRESENT, SUGGEST RECOLLECTION (A)  Final   Report Status 12/22/2017 FINAL  Final  Blood Cultures (routine x 2)     Status: None (Preliminary result)   Collection Time: 12/20/17  1:06 PM  Result Value  Ref Range Status   Specimen Description BLOOD RIGHT ARM  Final   Special Requests   Final    BOTTLES DRAWN AEROBIC AND ANAEROBIC Blood Culture adequate volume   Culture NO GROWTH 2 DAYS  Final   Report Status PENDING  Incomplete  Blood Cultures (routine x 2)     Status: None (Preliminary result)   Collection Time: 12/20/17  1:11 PM  Result Value Ref Range Status   Specimen Description BLOOD RIGHT FOREARM  Final   Special Requests   Final    BOTTLES DRAWN AEROBIC AND ANAEROBIC Blood Culture adequate volume   Culture NO GROWTH 2 DAYS  Final   Report Status PENDING  Incomplete  MRSA PCR Screening     Status: None   Collection Time: 12/20/17  5:43 PM  Result Value Ref Range Status   MRSA by PCR NEGATIVE NEGATIVE Final    Comment:        The GeneXpert MRSA Assay (FDA approved for NASAL specimens only), is one component of a comprehensive MRSA colonization surveillance program. It is not intended to diagnose MRSA infection nor to guide or monitor treatment for MRSA infections.      Labs: BNP (last 3 results) No results for input(s): BNP in the last 8760 hours. Basic Metabolic Panel: Recent Labs  Lab 12/20/17 1151 12/20/17 1211 12/20/17 1814 12/21/17 0408 12/22/17 0441  NA 143 143 139 136 139  K 3.5 3.4* 4.1 4.2 3.6  CL 106 111 106 103 103  CO2 12*  --  13* 23 26  GLUCOSE 29* 27* 77 104* 89  BUN 20 18 21* 19 16  CREATININE 0.97 1.00 0.98 0.92 0.94  CALCIUM 8.7*  --  8.4* 8.7* 9.1  MG  --   --  1.4* 2.5*  --   PHOS  --   --  2.4* 1.2* 3.6   Liver Function Tests: Recent Labs  Lab 12/20/17 1151 12/21/17 0408 12/22/17 0441  AST 106* 44* 30  ALT 29 20 18   ALKPHOS 34* 26* 28*  BILITOT 0.6 0.9 0.6  PROT 7.7 6.5 6.4*  ALBUMIN 4.3 3.6 3.4*   No results for input(s): LIPASE, AMYLASE in the last 168 hours. Recent Labs  Lab 12/20/17 1306  AMMONIA 31   CBC: Recent Labs  Lab 12/20/17 1151 12/20/17 1211 12/20/17 2006 12/21/17 0408 12/22/17 0441  WBC 3.9*  --   4.7 5.5 4.7  NEUTROABS 3.2  --   --   --   --   HGB 10.3* 13.6 8.8* 9.3* 9.7*  HCT 32.6* 40.0 27.5* 28.4* 30.8*  MCV 89.8  --  89.0 88.8 88.5  PLT 293  --  223 260 249   Cardiac Enzymes: Recent Labs  Lab 12/20/17 1151  TROPONINI <0.03   BNP: Invalid input(s): POCBNP CBG: Recent Labs  Lab 12/21/17 1830 12/21/17 2044 12/22/17 0025 12/22/17 0453 12/22/17 0758  GLUCAP 145* 72 112* 97 103*   D-Dimer No results for input(s): DDIMER in the last 72 hours. Hgb A1c Recent Labs    12/20/17 1814  HGBA1C 4.9   Lipid Profile No results for input(s): CHOL, HDL, LDLCALC, TRIG, CHOLHDL, LDLDIRECT in the last 72 hours. Thyroid function studies Recent Labs    12/20/17 1304  TSH 0.185*   Anemia work up No results for input(s): VITAMINB12, FOLATE, FERRITIN, TIBC, IRON, RETICCTPCT in the last 72 hours. Urinalysis    Component Value Date/Time   COLORURINE YELLOW 12/20/2017 1221   APPEARANCEUR CLEAR 12/20/2017 1221   LABSPEC 1.015 12/20/2017 1221   PHURINE 5.0 12/20/2017 1221   GLUCOSEU NEGATIVE 12/20/2017 1221   HGBUR SMALL (A) 12/20/2017 1221   BILIRUBINUR NEGATIVE 12/20/2017 1221   KETONESUR 20 (A) 12/20/2017 1221   PROTEINUR 30 (A) 12/20/2017 1221   NITRITE NEGATIVE 12/20/2017 1221   LEUKOCYTESUR NEGATIVE 12/20/2017 1221   Sepsis Labs Invalid input(s): PROCALCITONIN,  WBC,  LACTICIDVEN Microbiology Recent Results (from the past 240 hour(s))  Urine Culture     Status: Abnormal   Collection Time: 12/20/17 12:21 PM  Result Value Ref Range Status   Specimen Description URINE, CLEAN CATCH  Final   Special Requests NONE  Final   Culture MULTIPLE SPECIES PRESENT, SUGGEST RECOLLECTION (A)  Final   Report Status 12/22/2017 FINAL  Final  Blood Cultures (routine  x 2)     Status: None (Preliminary result)   Collection Time: 12/20/17  1:06 PM  Result Value Ref Range Status   Specimen Description BLOOD RIGHT ARM  Final   Special Requests   Final    BOTTLES DRAWN AEROBIC AND  ANAEROBIC Blood Culture adequate volume   Culture NO GROWTH 2 DAYS  Final   Report Status PENDING  Incomplete  Blood Cultures (routine x 2)     Status: None (Preliminary result)   Collection Time: 12/20/17  1:11 PM  Result Value Ref Range Status   Specimen Description BLOOD RIGHT FOREARM  Final   Special Requests   Final    BOTTLES DRAWN AEROBIC AND ANAEROBIC Blood Culture adequate volume   Culture NO GROWTH 2 DAYS  Final   Report Status PENDING  Incomplete  MRSA PCR Screening     Status: None   Collection Time: 12/20/17  5:43 PM  Result Value Ref Range Status   MRSA by PCR NEGATIVE NEGATIVE Final    Comment:        The GeneXpert MRSA Assay (FDA approved for NASAL specimens only), is one component of a comprehensive MRSA colonization surveillance program. It is not intended to diagnose MRSA infection nor to guide or monitor treatment for MRSA infections.      Time coordinating discharge: Over 30 minutes  SIGNED:   Rodena Goldmann, DO [facility] 12/22/2017, 11:30 AM Pager [phone]  If 7PM-7AM, please contact night-coverage [email or web address] Password [email or web address]

## 2017-12-25 LAB — CULTURE, BLOOD (ROUTINE X 2)
CULTURE: NO GROWTH
Culture: NO GROWTH
Special Requests: ADEQUATE
Special Requests: ADEQUATE

## 2018-11-20 ENCOUNTER — Encounter (HOSPITAL_COMMUNITY): Payer: Self-pay | Admitting: Emergency Medicine

## 2018-11-20 ENCOUNTER — Emergency Department (HOSPITAL_COMMUNITY)
Admission: EM | Admit: 2018-11-20 | Discharge: 2018-11-21 | Disposition: A | Discharge status: Home / Self Care | Payer: Medicaid Other | Attending: Emergency Medicine | Admitting: Emergency Medicine

## 2018-11-20 ENCOUNTER — Other Ambulatory Visit: Payer: Self-pay

## 2018-11-20 DIAGNOSIS — I10 Essential (primary) hypertension: Secondary | ICD-10-CM | POA: Insufficient documentation

## 2018-11-20 DIAGNOSIS — F17210 Nicotine dependence, cigarettes, uncomplicated: Secondary | ICD-10-CM | POA: Insufficient documentation

## 2018-11-20 DIAGNOSIS — R5383 Other fatigue: Secondary | ICD-10-CM | POA: Diagnosis not present

## 2018-11-20 DIAGNOSIS — Z79899 Other long term (current) drug therapy: Secondary | ICD-10-CM | POA: Insufficient documentation

## 2018-11-20 DIAGNOSIS — J45909 Unspecified asthma, uncomplicated: Secondary | ICD-10-CM | POA: Insufficient documentation

## 2018-11-20 DIAGNOSIS — R531 Weakness: Secondary | ICD-10-CM | POA: Diagnosis present

## 2018-11-20 LAB — URINALYSIS, ROUTINE W REFLEX MICROSCOPIC
Bacteria, UA: NONE SEEN
Bilirubin Urine: NEGATIVE
Glucose, UA: NEGATIVE mg/dL
Ketones, ur: 20 mg/dL — AB
Leukocytes, UA: NEGATIVE
Nitrite: NEGATIVE
Protein, ur: 30 mg/dL — AB
Specific Gravity, Urine: 1.018 (ref 1.005–1.030)
pH: 6 (ref 5.0–8.0)

## 2018-11-20 LAB — CBC WITH DIFFERENTIAL/PLATELET
Abs Immature Granulocytes: 0.01 10*3/uL (ref 0.00–0.07)
Basophils Absolute: 0 10*3/uL (ref 0.0–0.1)
Basophils Relative: 0 %
Eosinophils Absolute: 0 10*3/uL (ref 0.0–0.5)
Eosinophils Relative: 0 %
HCT: 40.4 % (ref 39.0–52.0)
Hemoglobin: 13.1 g/dL (ref 13.0–17.0)
Immature Granulocytes: 0 %
Lymphocytes Relative: 15 %
Lymphs Abs: 0.4 10*3/uL — ABNORMAL LOW (ref 0.7–4.0)
MCH: 26.1 pg (ref 26.0–34.0)
MCHC: 32.4 g/dL (ref 30.0–36.0)
MCV: 80.5 fL (ref 80.0–100.0)
Monocytes Absolute: 0.2 10*3/uL (ref 0.1–1.0)
Monocytes Relative: 6 %
Neutro Abs: 2.2 10*3/uL (ref 1.7–7.7)
Neutrophils Relative %: 79 %
Platelets: 196 10*3/uL (ref 150–400)
RBC: 5.02 MIL/uL (ref 4.22–5.81)
RDW: 17 % — ABNORMAL HIGH (ref 11.5–15.5)
WBC: 2.8 10*3/uL — ABNORMAL LOW (ref 4.0–10.5)
nRBC: 0 % (ref 0.0–0.2)

## 2018-11-20 LAB — BASIC METABOLIC PANEL
Anion gap: 19 — ABNORMAL HIGH (ref 5–15)
BUN: 21 mg/dL — ABNORMAL HIGH (ref 6–20)
CO2: 22 mmol/L (ref 22–32)
Calcium: 8.5 mg/dL — ABNORMAL LOW (ref 8.9–10.3)
Chloride: 96 mmol/L — ABNORMAL LOW (ref 98–111)
Creatinine, Ser: 1.27 mg/dL — ABNORMAL HIGH (ref 0.61–1.24)
GFR calc Af Amer: >60 mL/min (ref >60)
GFR calc non Af Amer: >60 mL/min (ref >60)
Glucose, Bld: 88 mg/dL (ref 70–99)
Potassium: 3.4 mmol/L — ABNORMAL LOW (ref 3.5–5.1)
Sodium: 137 mmol/L (ref 135–145)

## 2018-11-20 MED ORDER — LACTATED RINGERS IV BOLUS
1000.0000 mL | Freq: Once | INTRAVENOUS | Status: AC
  Administered 2018-11-20: 1000 mL via INTRAVENOUS

## 2018-11-20 NOTE — ED Triage Notes (Signed)
Pt with unsteady gait and wife states his blood sugar gets low at times. She also states that he has been sweating and pt admits to "just not feeling well". Pt states he feels dehydrated and that when he urinates that his urine stinks. Pt's blood sugar was 101 in triage.

## 2018-11-21 LAB — CBG MONITORING, ED: Glucose-Capillary: 101 mg/dL — ABNORMAL HIGH (ref 70–99)

## 2018-12-02 NOTE — ED Provider Notes (Signed)
[facility] EMERGENCY DEPARTMENT Provider Note   CSN: [id number] Arrival date & time: 11/20/18  2039     History   Chief Complaint Chief Complaint  Patient presents with  . weakness and chills    HPI Wesley Francis is a 52 y.o. male.  HPI   52 year old male with generalized weakness.  He states that he just does not feel well.  Gradual onset of symptoms a few days ago.  Feels like progressively worsening.  He was like he may be dehydrated.  Some nausea but no vomiting.  No fevers.  No diarrhea.  No acute respiratory complaints.  Denies any acute pain.  Past Medical History:  Diagnosis Date  . AKI (acute kidney injury) ([place]) 04/04/2016  . Anemia   . Arthritis   . Asthma   . ETOH abuse   . Folate deficiency 04/05/2016  . Heme positive stool 04/05/2016  . Hypertension     Patient Active Problem List   Diagnosis Date Noted  . Alcoholic ketoacidosis 22/97/9892  . Lactic acidosis 12/20/2017  . Hypoglycemia 12/20/2017  . Acute metabolic encephalopathy 11/94/1740  . Hypothermia 12/20/2017  . HTN (hypertension) 12/20/2017  . Alcohol intoxication ([place])   . Chest congestion   . Folate deficiency 04/05/2016  . Heme positive stool 04/05/2016  . AKI (acute kidney injury) ([place]) 04/04/2016  . Alcohol abuse 04/04/2016  . Tobacco abuse 04/04/2016  . Normocytic anemia 04/04/2016  . Chronic low back pain 04/04/2016  . Chronic leg pain 04/04/2016    Past Surgical History:  Procedure Laterality Date  . COLONOSCOPY    . COLONOSCOPY WITH PROPOFOL N/A 03/02/2017   Procedure: COLONOSCOPY WITH PROPOFOL;  Surgeon: Lollie Sails, MD;  Location: [facility] ENDOSCOPY;  Service: Endoscopy;  Laterality: N/A;  . FRACTURE SURGERY    . KNEE ARTHROCENTESIS          Home Medications    Prior to Admission medications   Medication Sig Start Date End Date Taking? Authorizing Provider  acetaminophen (TYLENOL) 325 MG tablet Take 650 mg by mouth every 6 (six) hours as needed.   Yes [provider]  albuterol (PROVENTIL HFA;VENTOLIN HFA) 108 (90 Base) MCG/ACT inhaler Inhale 2 puffs into the lungs every 6 (six) hours as needed for wheezing or shortness of breath. 04/06/16  Yes Rexene Alberts, MD  amLODipine (NORVASC) 5 MG tablet Take 5 mg by mouth at bedtime.   Yes [provider]  Cholecalciferol (VITAMIN D) 2000 units CAPS Take 1 capsule by mouth once a week.    Yes [provider]  ferrous sulfate 325 (65 FE) MG tablet Take 325 mg by mouth as directed. Take 1 tablet by mouth three times daily for 7 days. Then take 1 tablet twice daily for 7 days. Then take 1 tablet once daily. Started 12/16/2017   Yes [provider]  thiamine 100 MG tablet Take 1 tablet (100 mg total) by mouth daily. 12/23/17  Yes Shah, Pratik D, DO  ibuprofen (ADVIL,MOTRIN) 600 MG tablet Take 1 tablet (600 mg total) by mouth every 6 (six) hours as needed. 08/21/17   Sable Feil, PA-C  metoprolol succinate (TOPROL-XL) 50 MG 24 hr tablet Take 50 mg by mouth daily. Take with or immediately following a meal.    [provider]  nicotine (NICODERM CQ - DOSED IN MG/24 HOURS) 21 mg/24hr patch Place 1 patch (21 mg total) onto the skin daily. 12/23/17   Heath Lark D, DO    Family History Family  History  Problem Relation Age of Onset  . Hypertension Mother   . Stroke Mother   . Hypertension Father     Social History Social History   Tobacco Use  . Smoking status: Current Every Day Smoker    Packs/day: 1.00    Types: Cigarettes  . Smokeless tobacco: Never Used  Substance Use Topics  . Alcohol use: Yes    Comment: today  . Drug use: No     Allergies   Patient has no known allergies.   Review of Systems Review of Systems  All systems reviewed and negative, other than as noted in HPI.  Physical Exam Updated Vital Signs BP (!) 130/92 (BP Location: Left Arm)   Pulse 82   Temp 98.3 F (36.8 C) (Oral)   Resp 17   Ht 5\' 7"  (1.702 m)   Wt 64 kg   SpO2 98%    BMI 22.08 kg/m   Physical Exam Vitals signs and nursing note reviewed.  Constitutional:      General: He is not in acute distress.    Appearance: He is well-developed.  HENT:     Head: Normocephalic and atraumatic.  Eyes:     General:        Right eye: No discharge.        Left eye: No discharge.     Conjunctiva/sclera: Conjunctivae normal.  Neck:     Musculoskeletal: Neck supple.  Cardiovascular:     Rate and Rhythm: Normal rate and regular rhythm.     Heart sounds: Normal heart sounds. No murmur. No friction rub. No gallop.   Pulmonary:     Effort: Pulmonary effort is normal. No respiratory distress.     Breath sounds: Normal breath sounds.  Abdominal:     General: There is no distension.     Palpations: Abdomen is soft.     Tenderness: There is no abdominal tenderness.  Musculoskeletal:        General: No tenderness.  Skin:    General: Skin is warm and dry.  Neurological:     Mental Status: He is alert and oriented to person, place, and time.     Cranial Nerves: No cranial nerve deficit.     Sensory: No sensory deficit.     Motor: No weakness.  Psychiatric:        Behavior: Behavior normal.        Thought Content: Thought content normal.      ED Treatments / Results  Labs (all labs ordered are listed, but only abnormal results are displayed) Labs Reviewed  CBC WITH DIFFERENTIAL/PLATELET - Abnormal; Notable for the following components:      Result Value   WBC 2.8 (*)    RDW 17.0 (*)    Lymphs Abs 0.4 (*)    All other components within normal limits  BASIC METABOLIC PANEL - Abnormal; Notable for the following components:   Potassium 3.4 (*)    Chloride 96 (*)    BUN 21 (*)    Creatinine, Ser 1.27 (*)    Calcium 8.5 (*)    Anion gap 19 (*)    All other components within normal limits  URINALYSIS, ROUTINE W REFLEX MICROSCOPIC - Abnormal; Notable for the following components:   Hgb urine dipstick SMALL (*)    Ketones, ur 20 (*)    Protein, ur 30 (*)     All other components within normal limits  CBG MONITORING, ED - Abnormal; Notable for the following components:  Glucose-Capillary 101 (*)    All other components within normal limits    EKG None  Radiology No results found.  Procedures Procedures (including critical care time)  Medications Ordered in ED Medications  lactated ringers bolus 1,000 mL (0 mLs Intravenous Stopped 11/20/18 2308)     Initial Impression / Assessment and Plan / ED Course  I have reviewed the triage vital signs and the nursing notes.  Pertinent labs & imaging results that were available during my care of the patient were reviewed by me and considered in my medical decision making (see chart for details).     52 year old male with vague complaints of generally not feeling well.  ED work-up fairly unremarkable.  Is afebrile.  Hemodynamically stable.  Feeling better after some IV fluids.  It has been determined that no acute conditions requiring further emergency intervention are present at this time. The patient has been advised of the diagnosis and plan. I reviewed any labs and imaging including any potential incidental findings. We have discussed signs and symptoms that warrant return to the ED and they are listed in the discharge instructions.    Final Clinical Impressions(s) / ED Diagnoses   Final diagnoses:  Other fatigue    ED Discharge Orders    None       Virgel Manifold, MD 12/02/18 2041

## 2019-02-22 ENCOUNTER — Encounter (HOSPITAL_COMMUNITY): Payer: Self-pay | Admitting: Emergency Medicine

## 2019-02-22 ENCOUNTER — Emergency Department (HOSPITAL_COMMUNITY)
Admission: EM | Admit: 2019-02-22 | Discharge: 2019-02-22 | Disposition: A | Discharge status: Home / Self Care | Payer: Medicaid Other | Attending: Emergency Medicine | Admitting: Emergency Medicine

## 2019-02-22 ENCOUNTER — Other Ambulatory Visit: Payer: Self-pay

## 2019-02-22 DIAGNOSIS — I10 Essential (primary) hypertension: Secondary | ICD-10-CM | POA: Diagnosis not present

## 2019-02-22 DIAGNOSIS — J449 Chronic obstructive pulmonary disease, unspecified: Secondary | ICD-10-CM | POA: Diagnosis not present

## 2019-02-22 DIAGNOSIS — F17210 Nicotine dependence, cigarettes, uncomplicated: Secondary | ICD-10-CM | POA: Insufficient documentation

## 2019-02-22 DIAGNOSIS — D649 Anemia, unspecified: Secondary | ICD-10-CM | POA: Insufficient documentation

## 2019-02-22 DIAGNOSIS — E162 Hypoglycemia, unspecified: Secondary | ICD-10-CM | POA: Insufficient documentation

## 2019-02-22 DIAGNOSIS — E876 Hypokalemia: Secondary | ICD-10-CM | POA: Diagnosis not present

## 2019-02-22 DIAGNOSIS — Z79899 Other long term (current) drug therapy: Secondary | ICD-10-CM | POA: Diagnosis not present

## 2019-02-22 LAB — CBG MONITORING, ED
GLUCOSE-CAPILLARY: 46 mg/dL — AB (ref 70–99)
GLUCOSE-CAPILLARY: 115 mg/dL — AB (ref 70–99)

## 2019-02-22 MED ORDER — POTASSIUM CHLORIDE CRYS ER 20 MEQ PO TBCR: 20.0000 meq | EXTENDED_RELEASE_TABLET | Freq: Every day | ORAL | 0 refills | Status: DC

## 2019-02-22 NOTE — ED Notes (Signed)
Patient given meal tray, ginger-ale and applesauce to increase blood sugar.

## 2019-02-22 NOTE — Discharge Instructions (Signed)
Your blood sugar came up nicely with food.  Make sure you eat these 2 3 small meals a day.  Potassium was also low information on foods high in potassium provided bananas are included.  Take the potassium pill supplements over the next few days.  Make an appointment to follow-up with your doctor to have your sugar and potassium rechecked.  Return for any new or worse symptoms.

## 2019-02-22 NOTE — ED Triage Notes (Signed)
Pt went to health care yesterday for back pain. They checked labs and called them this morning stating he need to come to ED.  Pt denies any other symptoms.

## 2019-02-22 NOTE — ED Provider Notes (Addendum)
[facility] EMERGENCY DEPARTMENT Provider Note   CSN: [id number] Arrival date & time: 02/22/19  1118    History   Chief Complaint Chief Complaint  Patient presents with  . Abnormal Lab    HPI Wesley Francis is a 53 y.o. male.     Patient sent in by primary care provider for concerns of low blood sugar and low potassium.  Labs were done yesterday.  Blood sugar was 30 and potassium was 2.7.  Patient has a history of alcoholic ketoacidosis and was admitted for that in December.  Past medical history for him is significant for anemia hypertension COPD alcohol abuse.  According to family patient not eating very well.  Patient alert here today no acute distress.     Past Medical History:  Diagnosis Date  . AKI (acute kidney injury) ([place]) 04/04/2016  . Anemia   . Arthritis   . Asthma   . ETOH abuse   . Folate deficiency 04/05/2016  . Heme positive stool 04/05/2016  . Hypertension     Patient Active Problem List   Diagnosis Date Noted  . Alcoholic ketoacidosis 57/84/6962  . Lactic acidosis 12/20/2017  . Hypoglycemia 12/20/2017  . Acute metabolic encephalopathy 95/28/4132  . Hypothermia 12/20/2017  . HTN (hypertension) 12/20/2017  . Alcohol intoxication ([place])   . Chest congestion   . Folate deficiency 04/05/2016  . Heme positive stool 04/05/2016  . AKI (acute kidney injury) ([place]) 04/04/2016  . Alcohol abuse 04/04/2016  . Tobacco abuse 04/04/2016  . Normocytic anemia 04/04/2016  . Chronic low back pain 04/04/2016  . Chronic leg pain 04/04/2016    Past Surgical History:  Procedure Laterality Date  . COLONOSCOPY    . COLONOSCOPY WITH PROPOFOL N/A 03/02/2017   Procedure: COLONOSCOPY WITH PROPOFOL;  Surgeon: Lollie Sails, MD;  Location: [facility] ENDOSCOPY;  Service: Endoscopy;  Laterality: N/A;  . FRACTURE SURGERY    . KNEE ARTHROCENTESIS          Home Medications    Prior to Admission medications   Medication Sig Start Date End Date Taking? Authorizing  Provider  acetaminophen (TYLENOL) 325 MG tablet Take 650 mg by mouth every 6 (six) hours as needed.    [provider]  albuterol (PROVENTIL HFA;VENTOLIN HFA) 108 (90 Base) MCG/ACT inhaler Inhale 2 puffs into the lungs every 6 (six) hours as needed for wheezing or shortness of breath. 04/06/16   Rexene Alberts, MD  amLODipine (NORVASC) 5 MG tablet Take 5 mg by mouth at bedtime.    [provider]  Cholecalciferol (VITAMIN D) 2000 units CAPS Take 1 capsule by mouth once a week.     [provider]  ferrous sulfate 325 (65 FE) MG tablet Take 325 mg by mouth as directed. Take 1 tablet by mouth three times daily for 7 days. Then take 1 tablet twice daily for 7 days. Then take 1 tablet once daily. Started 12/16/2017    [provider]  ibuprofen (ADVIL,MOTRIN) 600 MG tablet Take 1 tablet (600 mg total) by mouth every 6 (six) hours as needed. 08/21/17   Sable Feil, PA-C  metoprolol succinate (TOPROL-XL) 50 MG 24 hr tablet Take 50 mg by mouth daily. Take with or immediately following a meal.    [provider]  nicotine (NICODERM CQ - DOSED IN MG/24 HOURS) 21 mg/24hr patch Place 1 patch (21 mg total) onto the skin daily. 12/23/17   Manuella Ghazi, Pratik D, DO  potassium chloride SA (K-DUR,KLOR-CON) 20 MEQ tablet Take  1 tablet (20 mEq total) by mouth daily. 02/22/19   Fredia Sorrow, MD  thiamine 100 MG tablet Take 1 tablet (100 mg total) by mouth daily. 12/23/17   Heath Lark D, DO    Family History Family History  Problem Relation Age of Onset  . Hypertension Mother   . Stroke Mother   . Hypertension Father     Social History Social History   Tobacco Use  . Smoking status: Current Every Day Smoker    Packs/day: 1.00    Types: Cigarettes  . Smokeless tobacco: Never Used  Substance Use Topics  . Alcohol use: Yes    Comment: today  . Drug use: No     Allergies   Patient has no known allergies.   Review of Systems Review of Systems  Constitutional:  Negative for chills and fever.  HENT: Negative for rhinorrhea and sore throat.   Eyes: Negative for visual disturbance.  Respiratory: Negative for cough and shortness of breath.   Cardiovascular: Negative for chest pain and leg swelling.  Gastrointestinal: Negative for abdominal pain, diarrhea, nausea and vomiting.  Genitourinary: Negative for dysuria.  Musculoskeletal: Negative for back pain and neck pain.  Skin: Negative for rash.  Neurological: Negative for dizziness, light-headedness and headaches.  Hematological: Does not bruise/bleed easily.  Psychiatric/Behavioral: Negative for confusion.     Physical Exam Updated Vital Signs BP (!) 168/109 (BP Location: Right Arm)   Pulse 64   Temp 98.1 F (36.7 C) (Temporal)   Resp 14   Ht 1.676 m (5\' 6" )   Wt 64.9 kg   SpO2 98%   BMI 23.08 kg/m   Physical Exam Vitals signs and nursing note reviewed.  Constitutional:      General: He is not in acute distress.    Appearance: Normal appearance. He is well-developed. He is not toxic-appearing.  HENT:     Head: Normocephalic and atraumatic.     Mouth/Throat:     Mouth: Mucous membranes are moist.  Eyes:     Extraocular Movements: Extraocular movements intact.     Conjunctiva/sclera: Conjunctivae normal.     Pupils: Pupils are equal, round, and reactive to light.  Neck:     Musculoskeletal: Normal range of motion and neck supple.  Cardiovascular:     Rate and Rhythm: Normal rate and regular rhythm.     Heart sounds: Normal heart sounds. No murmur.  Pulmonary:     Effort: Pulmonary effort is normal. No respiratory distress.     Breath sounds: Normal breath sounds.  Abdominal:     Palpations: Abdomen is soft.     Tenderness: There is no abdominal tenderness.  Musculoskeletal: Normal range of motion.  Skin:    General: Skin is warm and dry.  Neurological:     General: No focal deficit present.     Mental Status: He is alert and oriented to person, place, and time.       ED Treatments / Results  Labs (all labs ordered are listed, but only abnormal results are displayed) Labs Reviewed  CBG MONITORING, ED - Abnormal; Notable for the following components:      Result Value   Glucose-Capillary 46 (*)    All other components within normal limits  CBG MONITORING, ED - Abnormal; Notable for the following components:   Glucose-Capillary 115 (*)    All other components within normal limits    EKG None  Radiology No results found.  Procedures Procedures (including critical care time)  Medications Ordered  in ED Medications - No data to display   Initial Impression / Assessment and Plan / ED Course  I have reviewed the triage vital signs and the nursing notes.  Pertinent labs & imaging results that were available during my care of the patient were reviewed by me and considered in my medical decision making (see chart for details).        Labs also showed that his renal function was normal.  The labs that were done by the primary care doctor yesterday.  Patient CBC also normal white count and normal hemoglobin.  Potassium 2.7.  Blood sugar here was 46.  Based on this patient was given food to eat.  Blood sugar came up to 1 1530 minutes after finished eating.  Patient nontoxic no acute distress.  Patient be discharged home with a potassium supplement and information on food that is rich in potassium.  Instructions states at least 3 small meals a day.  And follow back up with primary care doctor for recheck of sugar and potassium.  Final Clinical Impressions(s) / ED Diagnoses   Final diagnoses:  Hypoglycemia  Hypokalemia    ED Discharge Orders         Ordered    potassium chloride SA (K-DUR,KLOR-CON) 20 MEQ tablet  Daily     02/22/19 1406           Fredia Sorrow, MD 02/22/19 1412    Fredia Sorrow, MD 02/22/19 1413

## 2020-06-28 ENCOUNTER — Other Ambulatory Visit: Payer: Medicaid Other

## 2020-07-04 ENCOUNTER — Other Ambulatory Visit
Admission: RE | Admit: 2020-07-04 | Discharge: 2020-07-04 | Disposition: A | Discharge status: Home / Self Care | Payer: Medicaid Other | Source: Ambulatory Visit | Attending: Gastroenterology | Admitting: Gastroenterology

## 2020-07-04 ENCOUNTER — Other Ambulatory Visit: Payer: Self-pay

## 2020-07-04 DIAGNOSIS — Z01812 Encounter for preprocedural laboratory examination: Secondary | ICD-10-CM | POA: Diagnosis not present

## 2020-07-04 DIAGNOSIS — Z20822 Contact with and (suspected) exposure to covid-19: Secondary | ICD-10-CM | POA: Insufficient documentation

## 2020-07-04 LAB — SARS CORONAVIRUS 2 (TAT 6-24 HRS): SARS Coronavirus 2: NEGATIVE

## 2020-07-08 ENCOUNTER — Ambulatory Visit
Admission: RE | Admit: 2020-07-08 | Discharge: 2020-07-08 | Disposition: A | Discharge status: Home / Self Care | Payer: Medicaid Other | Attending: Gastroenterology | Admitting: Gastroenterology

## 2020-07-08 ENCOUNTER — Ambulatory Visit: Payer: Medicaid Other | Admitting: Registered Nurse

## 2020-07-08 ENCOUNTER — Encounter: Payer: Self-pay | Admitting: General Surgery

## 2020-07-08 ENCOUNTER — Other Ambulatory Visit: Payer: Self-pay

## 2020-07-08 ENCOUNTER — Encounter
Admission: RE | Disposition: A | Discharge status: Home / Self Care | Payer: Self-pay | Source: Home / Self Care | Attending: Gastroenterology

## 2020-07-08 DIAGNOSIS — I10 Essential (primary) hypertension: Secondary | ICD-10-CM | POA: Insufficient documentation

## 2020-07-08 DIAGNOSIS — K648 Other hemorrhoids: Secondary | ICD-10-CM | POA: Insufficient documentation

## 2020-07-08 DIAGNOSIS — D509 Iron deficiency anemia, unspecified: Secondary | ICD-10-CM | POA: Insufficient documentation

## 2020-07-08 DIAGNOSIS — J45909 Unspecified asthma, uncomplicated: Secondary | ICD-10-CM | POA: Diagnosis not present

## 2020-07-08 DIAGNOSIS — Z79899 Other long term (current) drug therapy: Secondary | ICD-10-CM | POA: Diagnosis not present

## 2020-07-08 HISTORY — PX: ESOPHAGOGASTRODUODENOSCOPY (EGD) WITH PROPOFOL: SHX5813

## 2020-07-08 HISTORY — PX: COLONOSCOPY WITH PROPOFOL: SHX5780

## 2020-07-08 SURGERY — COLONOSCOPY WITH PROPOFOL
Anesthesia: General

## 2020-07-08 MED ORDER — PROPOFOL 10 MG/ML IV BOLUS
INTRAVENOUS | Status: DC | PRN
  Administered 2020-07-08: 70 mg via INTRAVENOUS

## 2020-07-08 MED ORDER — GLYCOPYRROLATE 0.2 MG/ML IJ SOLN
INTRAMUSCULAR | Status: DC | PRN
  Administered 2020-07-08: .2 mg via INTRAVENOUS

## 2020-07-08 MED ORDER — LIDOCAINE HCL (CARDIAC) PF 100 MG/5ML IV SOSY
PREFILLED_SYRINGE | INTRAVENOUS | Status: DC | PRN
  Administered 2020-07-08: 60 mg via INTRAVENOUS

## 2020-07-08 MED ORDER — DEXMEDETOMIDINE HCL 200 MCG/2ML IV SOLN
INTRAVENOUS | Status: DC | PRN
  Administered 2020-07-08: 20 ug via INTRAVENOUS

## 2020-07-08 MED ORDER — SODIUM CHLORIDE 0.9 % IV SOLN: INTRAVENOUS | Status: DC

## 2020-07-08 MED ORDER — PROPOFOL 500 MG/50ML IV EMUL
INTRAVENOUS | Status: AC
  Filled 2020-07-08: qty 100

## 2020-07-08 MED ORDER — PHENYLEPHRINE HCL (PRESSORS) 10 MG/ML IV SOLN
INTRAVENOUS | Status: DC | PRN
  Administered 2020-07-08: 100 ug via INTRAVENOUS
  Administered 2020-07-08: 50 ug via INTRAVENOUS

## 2020-07-08 MED ORDER — GLYCOPYRROLATE 0.2 MG/ML IJ SOLN
INTRAMUSCULAR | Status: AC
  Filled 2020-07-08: qty 1

## 2020-07-08 MED ORDER — PROPOFOL 500 MG/50ML IV EMUL
INTRAVENOUS | Status: DC | PRN
  Administered 2020-07-08: 180 ug/kg/min via INTRAVENOUS

## 2020-07-08 NOTE — H&P (Signed)
Outpatient short stay form Pre-procedure 07/08/2020 10:48 AM Wesley Miyamoto MD, MPH  Primary Physician: NP Belenda Cruise  Reason for visit:  IDA  History of present illness:  54 y/o gentleman with history of alcohol use and IDA here for EGD/Colonoscopy. No family history of GI malignancies. Had normal colonoscopy about three years ago. Denies any blood thinners or abdominal surgeries.    Current Facility-Administered Medications:  .  0.9 %  sodium chloride infusion, , Intravenous, Continuous, Kayly Kriegel, Hilton Cork, MD, Last Rate: 20 mL/hr at 07/08/20 1008, New Bag at 07/08/20 1008  Medications Prior to Admission  Medication Sig Dispense Refill Last Dose  . Cholecalciferol (VITAMIN D) 2000 units CAPS Take 1 capsule by mouth once a week.    Past Week at Unknown time  . metoprolol succinate (TOPROL-XL) 50 MG 24 hr tablet Take 50 mg by mouth daily. Take with or immediately following a meal.   07/08/2020 at Unknown time  . montelukast (SINGULAIR) 10 MG tablet Take 10 mg by mouth at bedtime.   07/07/2020 at Unknown time  . pantoprazole (PROTONIX) 40 MG tablet Take 40 mg by mouth daily.   07/07/2020 at Unknown time  . thiamine 100 MG tablet Take 1 tablet (100 mg total) by mouth daily. 30 tablet 0 Past Week at Unknown time  . acetaminophen (TYLENOL) 325 MG tablet Take 650 mg by mouth every 6 (six) hours as needed.     [name] albuterol (PROVENTIL HFA;VENTOLIN HFA) 108 (90 Base) MCG/ACT inhaler Inhale 2 puffs into the lungs every 6 (six) hours as needed for wheezing or shortness of breath. 1 Inhaler 2   . amLODipine (NORVASC) 5 MG tablet Take 5 mg by mouth at bedtime.     . ferrous sulfate 325 (65 FE) MG tablet Take 325 mg by mouth as directed. Take 1 tablet by mouth three times daily for 7 days. Then take 1 tablet twice daily for 7 days. Then take 1 tablet once daily. Started 12/16/2017   07/05/2020  . ibuprofen (ADVIL,MOTRIN) 600 MG tablet Take 1 tablet (600 mg total) by mouth every 6 (six) hours as needed. 30 tablet 0    . nicotine (NICODERM CQ - DOSED IN MG/24 HOURS) 21 mg/24hr patch Place 1 patch (21 mg total) onto the skin daily. 28 patch 0   . potassium chloride SA (K-DUR,KLOR-CON) 20 MEQ tablet Take 1 tablet (20 mEq total) by mouth daily. 4 tablet 0      No Known Allergies   Past Medical History:  Diagnosis Date  . AKI (acute kidney injury) ([place]) 04/04/2016  . Anemia   . Arthritis   . Asthma   . ETOH abuse   . Folate deficiency 04/05/2016  . Heme positive stool 04/05/2016  . Hypertension     Review of systems:  Otherwise negative.    Physical Exam  Gen: Alert, oriented. Appears stated age.  HEENT: [place]/AT. PERRLA. Lungs: No respiratory distress Abd: soft, benign, no masses. BS+ Ext: No edema. Pulses 2+    Planned procedures: Proceed with EGD/colonoscopy. The patient understands the nature of the planned procedure, indications, risks, alternatives and potential complications including but not limited to bleeding, infection, perforation, damage to internal organs and possible oversedation/side effects from anesthesia. The patient agrees and gives consent to proceed.  Please refer to procedure notes for findings, recommendations and patient disposition/instructions.     Wesley Miyamoto MD, MPH Gastroenterology 07/08/2020  10:48 AM

## 2020-07-08 NOTE — Transfer of Care (Signed)
Immediate Anesthesia Transfer of Care Note  Patient: Wesley Francis  Procedure(s) Performed: COLONOSCOPY WITH PROPOFOL (N/A ) ESOPHAGOGASTRODUODENOSCOPY (EGD) WITH PROPOFOL (N/A )  Patient Location: PACU and Endoscopy Unit  Anesthesia Type:General   Level of Consciousness: drowsy  Airway & Oxygen Therapy: Patient Spontanous Breathing  Post-op Assessment: Report given to RN and Post -op Vital signs reviewed and stable  Post vital signs: Reviewed and stable  Last Vitals:  Vitals Value Taken Time  BP 115/83 07/08/20 1127  Temp 36.4 C 07/08/20 1126  Pulse 75 07/08/20 1127  Resp 15 07/08/20 1127  SpO2 100 % 07/08/20 1127  Vitals shown include unvalidated device data.  Last Pain:  Vitals:   07/08/20 1126  TempSrc: Tympanic  PainSc: Asleep         Complications: No complications documented.

## 2020-07-08 NOTE — Anesthesia Preprocedure Evaluation (Signed)
Anesthesia Evaluation  Patient identified by MRN, date of birth, ID band Patient awake    Reviewed: Allergy & Precautions, NPO status , Patient's Chart, lab work & pertinent test results  History of Anesthesia Complications Negative for: history of anesthetic complications  Airway Mallampati: II  TM Distance: >3 FB Neck ROM: Full    Dental  (+) Edentulous Lower, Edentulous Upper   Pulmonary asthma , Current Smoker and Patient abstained from smoking.,    breath sounds clear to auscultation- rhonchi (-) wheezing      Cardiovascular hypertension, Pt. on medications (-) CAD, (-) Past MI, (-) Cardiac Stents and (-) CABG  Rhythm:Regular Rate:Normal - Systolic murmurs and - Diastolic murmurs    Neuro/Psych neg Seizures PSYCHIATRIC DISORDERS negative neurological ROS     GI/Hepatic negative GI ROS, Neg liver ROS,   Endo/Other  negative endocrine ROSneg diabetes  Renal/GU negative Renal ROS     Musculoskeletal  (+) Arthritis ,   Abdominal (+) - obese,   Peds  Hematology  (+) anemia ,   Anesthesia Other Findings Past Medical History: 04/04/2016: AKI (acute kidney injury) ([place]) No date: Anemia No date: Arthritis No date: Asthma No date: ETOH abuse 04/05/2016: Folate deficiency 04/05/2016: Heme positive stool No date: Hypertension   Reproductive/Obstetrics                            Anesthesia Physical Anesthesia Plan  ASA: II  Anesthesia Plan: General   Post-op Pain Management:    Induction: Intravenous  PONV Risk Score and Plan: 0 and Propofol infusion  Airway Management Planned: Natural Airway  Additional Equipment:   Intra-op Plan:   Post-operative Plan:   Informed Consent: I have reviewed the patients History and Physical, chart, labs and discussed the procedure including the risks, benefits and alternatives for the proposed anesthesia with the patient or authorized  representative who has indicated his/her understanding and acceptance.     Dental advisory given  Plan Discussed with: CRNA and Anesthesiologist  Anesthesia Plan Comments:         Anesthesia Quick Evaluation

## 2020-07-08 NOTE — Anesthesia Postprocedure Evaluation (Signed)
Anesthesia Post Note  Patient: DARRLY LOBERG  Procedure(s) Performed: COLONOSCOPY WITH PROPOFOL (N/A ) ESOPHAGOGASTRODUODENOSCOPY (EGD) WITH PROPOFOL (N/A )  Patient location during evaluation: Endoscopy Anesthesia Type: General Level of consciousness: awake and alert and oriented Pain management: pain level controlled Vital Signs Assessment: post-procedure vital signs reviewed and stable Respiratory status: spontaneous breathing, nonlabored ventilation and respiratory function stable Cardiovascular status: blood pressure returned to baseline and stable Postop Assessment: no signs of nausea or vomiting Anesthetic complications: no   No complications documented.   Last Vitals:  Vitals:   07/08/20 1136 07/08/20 1146  BP: 116/82 (!) 135/93  Pulse: 75 65  Resp: 15 14  Temp:    SpO2: 96% 97%    Last Pain:  Vitals:   07/08/20 1146  TempSrc:   PainSc: 0-No pain                 Maneh Sieben

## 2020-07-08 NOTE — Op Note (Signed)
[facility] Gastroenterology Patient Name: Wesley Francis Procedure Date: 07/08/2020 10:52 AM MRN: [id number] Account #: [id number] Date of Birth: 1966/12/06 Admit Type: Outpatient Age: 54 Room: [facility] ENDO ROOM 1 Gender: Male Note Status: Finalized Procedure:             Upper GI endoscopy Indications:           Iron deficiency anemia Providers:             Andrey Farmer MD, MD Referring MD:          Arcola Jansky. Cobb (Referring MD) Medicines:             Monitored Anesthesia Care Complications:         No immediate complications. Estimated blood loss:                         Minimal. Procedure:             Pre-Anesthesia Assessment:                        - Prior to the procedure, a History and Physical was                         performed, and patient medications and allergies were                         reviewed. The patient is competent. The risks and                         benefits of the procedure and the sedation options and                         risks were discussed with the patient. All questions                         were answered and informed consent was obtained.                         Patient identification and proposed procedure were                         verified by the physician, the nurse, the anesthetist                         and the technician in the endoscopy suite. Mental                         Status Examination: alert and oriented. Airway                         Examination: normal oropharyngeal airway and neck                         mobility. Respiratory Examination: clear to                         auscultation. CV Examination: normal. Prophylactic  Antibiotics: The patient does not require prophylactic                         antibiotics. Prior Anticoagulants: The patient has                         taken no previous anticoagulant or antiplatelet                         agents. ASA Grade Assessment: II -  A patient with mild                         systemic disease. After reviewing the risks and                         benefits, the patient was deemed in satisfactory                         condition to undergo the procedure. The anesthesia                         plan was to use monitored anesthesia care (MAC).                         Immediately prior to administration of medications,                         the patient was re-assessed for adequacy to receive                         sedatives. The heart rate, respiratory rate, oxygen                         saturations, blood pressure, adequacy of pulmonary                         ventilation, and response to care were monitored                         throughout the procedure. The physical status of the                         patient was re-assessed after the procedure.                        After obtaining informed consent, the endoscope was                         passed under direct vision. Throughout the procedure,                         the patient's blood pressure, pulse, and oxygen                         saturations were monitored continuously. The Endoscope                         was introduced through the mouth, and advanced to the  second part of duodenum. The upper GI endoscopy was                         accomplished without difficulty. The patient tolerated                         the procedure well. Findings:      The examined esophagus was normal.      The entire examined stomach was normal. Biopsies were taken with a cold       forceps for Helicobacter pylori testing. Estimated blood loss was       minimal.      The examined duodenum was normal. Impression:            - Normal esophagus.                        - Normal stomach. Biopsied.                        - Normal examined duodenum. Recommendation:        - Discharge patient to home.                        - NPO.                         - Continue present medications.                        - Await pathology results.                        - Perform a colonoscopy today. Procedure Code(s):     --- Professional ---                        [phone], Esophagogastroduodenoscopy, flexible,                         transoral; with biopsy, single or multiple Diagnosis Code(s):     --- Professional ---                        D50.9, Iron deficiency anemia, unspecified CPT copyright 2019 American Medical Association. All rights reserved. The codes documented in this report are preliminary and upon coder review may  be revised to meet current compliance requirements. Andrey Farmer, MD Andrey Farmer MD, MD 07/08/2020 11:26:56 AM Number of Addenda: 0 Note Initiated On: 07/08/2020 10:52 AM Estimated Blood Loss:  Estimated blood loss was minimal.      [facility]

## 2020-07-08 NOTE — Interval H&P Note (Signed)
History and Physical Interval Note:  07/08/2020 10:50 AM  Wesley Francis  has presented today for surgery, with the diagnosis of IDA.  The various methods of treatment have been discussed with the patient and family. After consideration of risks, benefits and other options for treatment, the patient has consented to  Procedure(s): COLONOSCOPY WITH PROPOFOL (N/A) ESOPHAGOGASTRODUODENOSCOPY (EGD) WITH PROPOFOL (N/A) as a surgical intervention.  The patient's history has been reviewed, patient examined, no change in status, stable for surgery.  I have reviewed the patient's chart and labs.  Questions were answered to the patient's satisfaction.     Lesly Rubenstein  Ok to proceed with EGD/Colonoscopy

## 2020-07-08 NOTE — Op Note (Signed)
[facility] Patient Name: Wesley Francis Procedure Date: 07/08/2020 10:51 AM MRN: [id number] Account #: [id number] Date of Birth: 1965/12/28 Admit Type: Outpatient Age: 54 Room: [facility] ENDO ROOM 1 Gender: Male Note Status: Finalized Procedure:             Colonoscopy Indications:           Iron deficiency anemia Providers:             Andrey Farmer MD, MD Referring MD:          Arcola Jansky. Cobb (Referring MD) Medicines:             Monitored Anesthesia Care Complications:         No immediate complications. Procedure:             Pre-Anesthesia Assessment:                        - Prior to the procedure, a History and Physical was                         performed, and patient medications and allergies were                         reviewed. The patient is competent. The risks and                         benefits of the procedure and the sedation options and                         risks were discussed with the patient. All questions                         were answered and informed consent was obtained.                         Patient identification and proposed procedure were                         verified by the physician, the nurse, the anesthetist                         and the technician in the endoscopy suite. Mental                         Status Examination: alert and oriented. Airway                         Examination: normal oropharyngeal airway and neck                         mobility. Respiratory Examination: clear to                         auscultation. CV Examination: normal. Prophylactic                         Antibiotics: The patient does not require prophylactic  antibiotics. Prior Anticoagulants: The patient has                         taken no previous anticoagulant or antiplatelet                         agents. ASA Grade Assessment: II - A patient with mild                         systemic disease.  After reviewing the risks and                         benefits, the patient was deemed in satisfactory                         condition to undergo the procedure. The anesthesia                         plan was to use monitored anesthesia care (MAC).                         Immediately prior to administration of medications,                         the patient was re-assessed for adequacy to receive                         sedatives. The heart rate, respiratory rate, oxygen                         saturations, blood pressure, adequacy of pulmonary                         ventilation, and response to care were monitored                         throughout the procedure. The physical status of the                         patient was re-assessed after the procedure.                        After obtaining informed consent, the colonoscope was                         passed under direct vision. Throughout the procedure,                         the patient's blood pressure, pulse, and oxygen                         saturations were monitored continuously. The                         Colonoscope was introduced through the anus and                         advanced to the the terminal ileum. The colonoscopy  was performed without difficulty. The patient                         tolerated the procedure well. The quality of the bowel                         preparation was poor. Findings:      The perianal and digital rectal examinations were normal.      The terminal ileum appeared normal.      A large amount of liquid stool was found in the entire colon, making       visualization difficult. Lavage of the area was performed, resulting in       clearance with fair visualization. No large lesions seen that would       explain iron deficiency anemia.      Internal hemorrhoids were found during retroflexion. The hemorrhoids       were small.      The exam was otherwise without  abnormality on direct and retroflexion       views. Impression:            - Preparation of the colon was poor.                        - The examined portion of the ileum was normal.                        - Stool in the entire examined colon.                        - Internal hemorrhoids.                        - The examination was otherwise normal on direct and                         retroflexion views.                        - No specimens collected. Recommendation:        - Discharge patient to home.                        - Resume previous diet.                        - Continue present medications.                        - Repeat colonoscopy per recommendation given on prior                         colonoscopy report.                        - Return to referring physician as previously                         scheduled. Procedure Code(s):     --- Professional ---                        [phone], Colonoscopy, flexible;  diagnostic, including                         collection of specimen(s) by brushing or washing, when                         performed (separate procedure) Diagnosis Code(s):     --- Professional ---                        K64.8, Other hemorrhoids                        D50.9, Iron deficiency anemia, unspecified CPT copyright 2019 [employer]. All rights reserved. The codes documented in this report are preliminary and upon coder review may  be revised to meet current compliance requirements. Andrey Farmer, MD Andrey Farmer MD, MD 07/08/2020 11:31:38 AM Number of Addenda: 0 Note Initiated On: 07/08/2020 10:51 AM Scope Withdrawal Time: 0 hours 5 minutes 25 seconds  Total Procedure Duration: 0 hours 13 minutes 4 seconds  Estimated Blood Loss:  Estimated blood loss: none.      [facility]

## 2020-07-09 LAB — SURGICAL PATHOLOGY

## 2020-09-10 ENCOUNTER — Other Ambulatory Visit: Payer: Self-pay | Admitting: Gastroenterology

## 2020-09-10 ENCOUNTER — Other Ambulatory Visit (HOSPITAL_COMMUNITY): Payer: Self-pay | Admitting: Gastroenterology

## 2020-09-10 DIAGNOSIS — R748 Abnormal levels of other serum enzymes: Secondary | ICD-10-CM

## 2020-09-17 ENCOUNTER — Other Ambulatory Visit: Payer: Self-pay

## 2020-09-17 ENCOUNTER — Ambulatory Visit
Admission: RE | Admit: 2020-09-17 | Discharge: 2020-09-17 | Disposition: A | Discharge status: Home / Self Care | Payer: Medicaid Other | Source: Ambulatory Visit | Attending: Gastroenterology | Admitting: Gastroenterology

## 2020-09-17 DIAGNOSIS — R748 Abnormal levels of other serum enzymes: Secondary | ICD-10-CM | POA: Insufficient documentation

## 2022-02-15 ENCOUNTER — Encounter (HOSPITAL_COMMUNITY): Payer: Self-pay | Admitting: Emergency Medicine

## 2022-02-15 ENCOUNTER — Emergency Department (HOSPITAL_COMMUNITY)
Admission: EM | Admit: 2022-02-15 | Discharge: 2022-02-15 | Disposition: A | Discharge status: Home / Self Care | Payer: Medicaid Other | Attending: Emergency Medicine | Admitting: Emergency Medicine

## 2022-02-15 ENCOUNTER — Emergency Department (HOSPITAL_COMMUNITY): Payer: Medicaid Other

## 2022-02-15 ENCOUNTER — Other Ambulatory Visit: Payer: Self-pay

## 2022-02-15 DIAGNOSIS — F17200 Nicotine dependence, unspecified, uncomplicated: Secondary | ICD-10-CM | POA: Diagnosis not present

## 2022-02-15 DIAGNOSIS — R066 Hiccough: Secondary | ICD-10-CM | POA: Diagnosis present

## 2022-02-15 DIAGNOSIS — Z79899 Other long term (current) drug therapy: Secondary | ICD-10-CM | POA: Diagnosis not present

## 2022-02-15 MED ORDER — METOCLOPRAMIDE HCL 10 MG PO TABS
10.0000 mg | ORAL_TABLET | Freq: Once | ORAL | Status: AC
  Administered 2022-02-15: 10 mg via ORAL
  Filled 2022-02-15: qty 1

## 2022-02-15 MED ORDER — METOCLOPRAMIDE HCL 10 MG PO TABS: 10.0000 mg | ORAL_TABLET | Freq: Three times a day (TID) | ORAL | 0 refills | Status: DC | PRN

## 2022-02-15 NOTE — Discharge Instructions (Signed)
Contact a health care provider if: ?Your hiccups last for more than 48 hours. ?Your hiccups do not improve with treatment. ?You cannot sleep or eat because of your hiccups. ?You have unexpected weight loss because of your hiccups. ?You have numbness, tingling, or weakness. ?Get help right away if: ?You have trouble breathing or swallowing. ?You have severe pain in your abdomen. ?

## 2022-02-15 NOTE — ED Provider Notes (Incomplete)
[facility] EMERGENCY DEPARTMENT Provider Note   CSN: [id number] Arrival date & time: 02/15/22  1627     History {Add pertinent medical, surgical, social history, OB history to HPI:1} Chief Complaint  Patient presents with   Hiccups    Wesley Francis is a 56 y.o. male who presents with chief complaint of hiccups.  History is given by the patient and his sister who is at bedside.  Patient has been having episodes of nausea on intractable hiccuping on and off for the past several days.  He has a history of gastroesophageal reflux disease and takes pantoprazole.  His sister states that he both smokes and drinks fairly heavily.  He has not had any shortness of breath, nausea, vomiting and is not actively having hiccups at this time.  HPI     Home Medications Prior to Admission medications   Medication Sig Start Date End Date Taking? Authorizing Provider  acetaminophen (TYLENOL) 325 MG tablet Take 650 mg by mouth every 6 (six) hours as needed.    [provider]  albuterol (PROVENTIL HFA;VENTOLIN HFA) 108 (90 Base) MCG/ACT inhaler Inhale 2 puffs into the lungs every 6 (six) hours as needed for wheezing or shortness of breath. 04/06/16   Rexene Alberts, MD  amLODipine (NORVASC) 5 MG tablet Take 5 mg by mouth at bedtime.    [provider]  Cholecalciferol (VITAMIN D) 2000 units CAPS Take 1 capsule by mouth once a week.     [provider]  ferrous sulfate 325 (65 FE) MG tablet Take 325 mg by mouth as directed. Take 1 tablet by mouth three times daily for 7 days. Then take 1 tablet twice daily for 7 days. Then take 1 tablet once daily. Started 12/16/2017    [provider]  ibuprofen (ADVIL,MOTRIN) 600 MG tablet Take 1 tablet (600 mg total) by mouth every 6 (six) hours as needed. 08/21/17   Sable Feil, PA-C  metoprolol succinate (TOPROL-XL) 50 MG 24 hr tablet Take 50 mg by mouth daily. Take with or immediately following a meal.    [provider]  montelukast (SINGULAIR) 10 MG tablet Take 10 mg by mouth at bedtime.    [provider]  nicotine (NICODERM CQ - DOSED IN MG/24 HOURS) 21 mg/24hr patch Place 1 patch (21 mg total) onto the skin daily. 12/23/17   Manuella Ghazi, Pratik D, DO  pantoprazole (PROTONIX) 40 MG tablet Take 40 mg by mouth daily.    [provider]  potassium chloride SA (K-DUR,KLOR-CON) 20 MEQ tablet Take 1 tablet (20 mEq total) by mouth daily. 02/22/19   Fredia Sorrow, MD  thiamine 100 MG tablet Take 1 tablet (100 mg total) by mouth daily. 12/23/17   Heath Lark D, DO      Allergies    Patient has no known allergies.    Review of Systems   Review of Systems  Physical Exam Updated Vital Signs BP 117/83    Pulse 87    Temp 98.3 F (36.8 C) (Oral)    Resp 18    Ht '5\' 6"'$  (1.676 m)    Wt 63.5 kg    SpO2 96%    BMI 22.60 kg/m  Physical Exam Vitals and nursing note reviewed.  Constitutional:      General: He is not in acute distress.    Appearance: He is well-developed. He is not diaphoretic.  HENT:     Head: Normocephalic and atraumatic.  Eyes:     General: No  scleral icterus.    Conjunctiva/sclera: Conjunctivae normal.  Cardiovascular:     Rate and Rhythm: Normal rate and regular rhythm.     Heart sounds: Normal heart sounds.  Pulmonary:     Effort: Pulmonary effort is normal. No respiratory distress.     Breath sounds: Normal breath sounds.  Abdominal:     Palpations: Abdomen is soft.     Tenderness: There is no abdominal tenderness.  Musculoskeletal:     Cervical back: Normal range of motion and neck supple.  Skin:    General: Skin is warm and dry.  Neurological:     Mental Status: He is alert.  Psychiatric:        Behavior: Behavior normal.    ED Results / Procedures / Treatments   Labs (all labs ordered are listed, but only abnormal results are displayed) Labs Reviewed - No data to display  EKG None  Radiology No results found.  Procedures Procedures  {Document  cardiac monitor, telemetry assessment procedure when appropriate:1}  Medications Ordered in ED Medications - No data to display  ED Course/ Medical Decision Making/ A&P                           Medical Decision Making  ***  {Document critical care time when appropriate:1} {Document review of labs and clinical decision tools ie heart score, Chads2Vasc2 etc:1}  {Document your independent review of radiology images, and any outside records:1} {Document your discussion with family members, caretakers, and with consultants:1} {Document social determinants of health affecting pt's care:1} {Document your decision making why or why not admission, treatments were needed:1} Final Clinical Impression(s) / ED Diagnoses Final diagnoses:  None    Rx / DC Orders ED Discharge Orders     None

## 2022-02-15 NOTE — ED Triage Notes (Signed)
Pt presents today c/o hiccups x 3 days unable to get them to stop.  ?

## 2022-03-09 ENCOUNTER — Inpatient Hospital Stay (HOSPITAL_COMMUNITY)
Admission: EM | Admit: 2022-03-09 | Discharge: 2022-03-13 | DRG: 377 | Disposition: A | Discharge status: Home / Self Care | Payer: Medicaid Other | Attending: Internal Medicine | Admitting: Internal Medicine

## 2022-03-09 ENCOUNTER — Inpatient Hospital Stay (HOSPITAL_COMMUNITY): Payer: Medicaid Other

## 2022-03-09 ENCOUNTER — Other Ambulatory Visit: Payer: Self-pay

## 2022-03-09 ENCOUNTER — Encounter (HOSPITAL_COMMUNITY): Payer: Self-pay | Admitting: Emergency Medicine

## 2022-03-09 ENCOUNTER — Emergency Department (HOSPITAL_COMMUNITY): Payer: Medicaid Other

## 2022-03-09 DIAGNOSIS — K7031 Alcoholic cirrhosis of liver with ascites: Secondary | ICD-10-CM | POA: Diagnosis present

## 2022-03-09 DIAGNOSIS — K2981 Duodenitis with bleeding: Secondary | ICD-10-CM | POA: Diagnosis present

## 2022-03-09 DIAGNOSIS — E876 Hypokalemia: Secondary | ICD-10-CM | POA: Diagnosis present

## 2022-03-09 DIAGNOSIS — I9589 Other hypotension: Secondary | ICD-10-CM

## 2022-03-09 DIAGNOSIS — D62 Acute posthemorrhagic anemia: Secondary | ICD-10-CM | POA: Diagnosis present

## 2022-03-09 DIAGNOSIS — K269 Duodenal ulcer, unspecified as acute or chronic, without hemorrhage or perforation: Secondary | ICD-10-CM

## 2022-03-09 DIAGNOSIS — Z79899 Other long term (current) drug therapy: Secondary | ICD-10-CM | POA: Diagnosis not present

## 2022-03-09 DIAGNOSIS — E861 Hypovolemia: Secondary | ICD-10-CM | POA: Diagnosis not present

## 2022-03-09 DIAGNOSIS — K648 Other hemorrhoids: Secondary | ICD-10-CM | POA: Diagnosis present

## 2022-03-09 DIAGNOSIS — D3A8 Other benign neuroendocrine tumors: Secondary | ICD-10-CM | POA: Diagnosis present

## 2022-03-09 DIAGNOSIS — D509 Iron deficiency anemia, unspecified: Secondary | ICD-10-CM | POA: Diagnosis present

## 2022-03-09 DIAGNOSIS — K264 Chronic or unspecified duodenal ulcer with hemorrhage: Secondary | ICD-10-CM

## 2022-03-09 DIAGNOSIS — F1010 Alcohol abuse, uncomplicated: Secondary | ICD-10-CM | POA: Diagnosis present

## 2022-03-09 DIAGNOSIS — Z823 Family history of stroke: Secondary | ICD-10-CM | POA: Diagnosis not present

## 2022-03-09 DIAGNOSIS — K449 Diaphragmatic hernia without obstruction or gangrene: Secondary | ICD-10-CM | POA: Diagnosis present

## 2022-03-09 DIAGNOSIS — J45909 Unspecified asthma, uncomplicated: Secondary | ICD-10-CM | POA: Diagnosis present

## 2022-03-09 DIAGNOSIS — F17210 Nicotine dependence, cigarettes, uncomplicated: Secondary | ICD-10-CM | POA: Diagnosis present

## 2022-03-09 DIAGNOSIS — K859 Acute pancreatitis without necrosis or infection, unspecified: Secondary | ICD-10-CM | POA: Diagnosis present

## 2022-03-09 DIAGNOSIS — Z8249 Family history of ischemic heart disease and other diseases of the circulatory system: Secondary | ICD-10-CM

## 2022-03-09 DIAGNOSIS — D72829 Elevated white blood cell count, unspecified: Secondary | ICD-10-CM | POA: Diagnosis present

## 2022-03-09 DIAGNOSIS — K922 Gastrointestinal hemorrhage, unspecified: Secondary | ICD-10-CM | POA: Diagnosis not present

## 2022-03-09 DIAGNOSIS — K2980 Duodenitis without bleeding: Secondary | ICD-10-CM

## 2022-03-09 DIAGNOSIS — D649 Anemia, unspecified: Secondary | ICD-10-CM | POA: Diagnosis present

## 2022-03-09 DIAGNOSIS — D500 Iron deficiency anemia secondary to blood loss (chronic): Secondary | ICD-10-CM | POA: Diagnosis present

## 2022-03-09 DIAGNOSIS — M1990 Unspecified osteoarthritis, unspecified site: Secondary | ICD-10-CM | POA: Diagnosis present

## 2022-03-09 DIAGNOSIS — Z720 Tobacco use: Secondary | ICD-10-CM | POA: Diagnosis not present

## 2022-03-09 DIAGNOSIS — K3189 Other diseases of stomach and duodenum: Secondary | ICD-10-CM | POA: Diagnosis present

## 2022-03-09 DIAGNOSIS — I10 Essential (primary) hypertension: Secondary | ICD-10-CM | POA: Diagnosis present

## 2022-03-09 DIAGNOSIS — K838 Other specified diseases of biliary tract: Secondary | ICD-10-CM | POA: Diagnosis not present

## 2022-03-09 DIAGNOSIS — K921 Melena: Secondary | ICD-10-CM | POA: Diagnosis not present

## 2022-03-09 DIAGNOSIS — I959 Hypotension, unspecified: Secondary | ICD-10-CM | POA: Diagnosis present

## 2022-03-09 LAB — LIPASE, BLOOD: Lipase: 49 U/L (ref 11–51)

## 2022-03-09 LAB — CBC WITH DIFFERENTIAL/PLATELET
Abs Immature Granulocytes: 0.17 10*3/uL — ABNORMAL HIGH (ref 0.00–0.07)
Basophils Absolute: 0 10*3/uL (ref 0.0–0.1)
Basophils Relative: 0 %
Eosinophils Absolute: 0 10*3/uL (ref 0.0–0.5)
Eosinophils Relative: 0 %
HCT: 24.3 % — ABNORMAL LOW (ref 39.0–52.0)
Hemoglobin: 7.4 g/dL — ABNORMAL LOW (ref 13.0–17.0)
Immature Granulocytes: 1 %
Lymphocytes Relative: 5 %
Lymphs Abs: 0.8 10*3/uL (ref 0.7–4.0)
MCH: 27.7 pg (ref 26.0–34.0)
MCHC: 30.5 g/dL (ref 30.0–36.0)
MCV: 91 fL (ref 80.0–100.0)
Monocytes Absolute: 1 10*3/uL (ref 0.1–1.0)
Monocytes Relative: 6 %
Neutro Abs: 15.7 10*3/uL — ABNORMAL HIGH (ref 1.7–7.7)
Neutrophils Relative %: 88 %
Platelets: 270 10*3/uL (ref 150–400)
RBC: 2.67 MIL/uL — ABNORMAL LOW (ref 4.22–5.81)
RDW: 18.8 % — ABNORMAL HIGH (ref 11.5–15.5)
WBC: 17.8 10*3/uL — ABNORMAL HIGH (ref 4.0–10.5)
nRBC: 0.3 % — ABNORMAL HIGH (ref 0.0–0.2)

## 2022-03-09 LAB — PREPARE RBC (CROSSMATCH)

## 2022-03-09 LAB — COMPREHENSIVE METABOLIC PANEL
ALT: 56 U/L — ABNORMAL HIGH (ref 0–44)
AST: 40 U/L (ref 15–41)
Albumin: 3 g/dL — ABNORMAL LOW (ref 3.5–5.0)
Alkaline Phosphatase: 207 U/L — ABNORMAL HIGH (ref 38–126)
Anion gap: 10 (ref 5–15)
BUN: 10 mg/dL (ref 6–20)
CO2: 21 mmol/L — ABNORMAL LOW (ref 22–32)
Calcium: 8.5 mg/dL — ABNORMAL LOW (ref 8.9–10.3)
Chloride: 110 mmol/L (ref 98–111)
Creatinine, Ser: 0.97 mg/dL (ref 0.61–1.24)
GFR, Estimated: >60 mL/min (ref >60)
Glucose, Bld: 102 mg/dL — ABNORMAL HIGH (ref 70–99)
Potassium: 4 mmol/L (ref 3.5–5.1)
Sodium: 141 mmol/L (ref 135–145)
Total Bilirubin: 2.5 mg/dL — ABNORMAL HIGH (ref 0.3–1.2)
Total Protein: 5.9 g/dL — ABNORMAL LOW (ref 6.5–8.1)

## 2022-03-09 LAB — PROTIME-INR
INR: 1.1 (ref 0.8–1.2)
Prothrombin Time: 13.7 seconds (ref 11.4–15.2)

## 2022-03-09 LAB — BRAIN NATRIURETIC PEPTIDE: B Natriuretic Peptide: 608 pg/mL — ABNORMAL HIGH (ref 0.0–100.0)

## 2022-03-09 LAB — ETHANOL: Alcohol, Ethyl (B): <10 mg/dL (ref <10)

## 2022-03-09 LAB — ABO/RH: ABO/RH(D): A POS

## 2022-03-09 MED ORDER — LEVALBUTEROL HCL 0.63 MG/3ML IN NEBU: 0.6300 mg | INHALATION_SOLUTION | Freq: Four times a day (QID) | RESPIRATORY_TRACT | Status: DC | PRN

## 2022-03-09 MED ORDER — SENNOSIDES-DOCUSATE SODIUM 8.6-50 MG PO TABS: 1.0000 | ORAL_TABLET | Freq: Every evening | ORAL | Status: DC | PRN

## 2022-03-09 MED ORDER — IOHEXOL 9 MG/ML PO SOLN
ORAL | Status: AC
  Filled 2022-03-09: qty 500

## 2022-03-09 MED ORDER — PANTOPRAZOLE SODIUM 40 MG IV SOLR
40.0000 mg | Freq: Once | INTRAVENOUS | Status: AC
  Administered 2022-03-09: 40 mg via INTRAVENOUS
  Filled 2022-03-09: qty 10

## 2022-03-09 MED ORDER — FUROSEMIDE 10 MG/ML IJ SOLN
20.0000 mg | Freq: Once | INTRAMUSCULAR | Status: AC
  Administered 2022-03-09: 20 mg via INTRAVENOUS
  Filled 2022-03-09: qty 2

## 2022-03-09 MED ORDER — THIAMINE HCL 100 MG/ML IJ SOLN
100.0000 mg | Freq: Every day | INTRAMUSCULAR | Status: DC
  Administered 2022-03-10 – 2022-03-13 (×4): 100 mg via INTRAVENOUS
  Filled 2022-03-09 (×4): qty 2

## 2022-03-09 MED ORDER — ONDANSETRON HCL 4 MG/2ML IJ SOLN: 4.0000 mg | Freq: Four times a day (QID) | INTRAMUSCULAR | Status: DC | PRN

## 2022-03-09 MED ORDER — CHLORHEXIDINE GLUCONATE CLOTH 2 % EX PADS
6.0000 | MEDICATED_PAD | Freq: Every day | CUTANEOUS | Status: DC
  Administered 2022-03-10 – 2022-03-12 (×3): 6 via TOPICAL

## 2022-03-09 MED ORDER — ACETAMINOPHEN 325 MG PO TABS: 650.0000 mg | ORAL_TABLET | Freq: Four times a day (QID) | ORAL | Status: DC | PRN

## 2022-03-09 MED ORDER — IPRATROPIUM BROMIDE 0.02 % IN SOLN: 0.5000 mg | Freq: Four times a day (QID) | RESPIRATORY_TRACT | Status: DC | PRN

## 2022-03-09 MED ORDER — PIPERACILLIN-TAZOBACTAM 3.375 G IVPB 30 MIN
3.3750 g | Freq: Once | INTRAVENOUS | Status: AC
  Administered 2022-03-09: 3.375 g via INTRAVENOUS
  Filled 2022-03-09: qty 50

## 2022-03-09 MED ORDER — SODIUM CHLORIDE 0.9% FLUSH
3.0000 mL | Freq: Two times a day (BID) | INTRAVENOUS | Status: DC
  Administered 2022-03-09 – 2022-03-12 (×6): 3 mL via INTRAVENOUS

## 2022-03-09 MED ORDER — METOPROLOL SUCCINATE ER 50 MG PO TB24: 50.0000 mg | ORAL_TABLET | Freq: Every day | ORAL | Status: DC

## 2022-03-09 MED ORDER — DEXMEDETOMIDINE HCL IN NACL 400 MCG/100ML IV SOLN: 0.2000 ug/kg/h | INTRAVENOUS | Status: DC

## 2022-03-09 MED ORDER — OXYCODONE HCL 5 MG PO TABS
5.0000 mg | ORAL_TABLET | ORAL | Status: DC | PRN
  Administered 2022-03-12 (×2): 5 mg via ORAL
  Filled 2022-03-09 (×2): qty 1

## 2022-03-09 MED ORDER — FOLIC ACID 1 MG PO TABS
1.0000 mg | ORAL_TABLET | Freq: Every day | ORAL | Status: DC
  Administered 2022-03-10 – 2022-03-13 (×4): 1 mg via ORAL
  Filled 2022-03-09 (×4): qty 1

## 2022-03-09 MED ORDER — SODIUM CHLORIDE 0.9% FLUSH: 3.0000 mL | INTRAVENOUS | Status: DC | PRN

## 2022-03-09 MED ORDER — PANTOPRAZOLE SODIUM 40 MG IV SOLR
40.0000 mg | Freq: Two times a day (BID) | INTRAVENOUS | Status: DC
  Administered 2022-03-09 – 2022-03-10 (×2): 40 mg via INTRAVENOUS
  Filled 2022-03-09 (×2): qty 10

## 2022-03-09 MED ORDER — ADULT MULTIVITAMIN W/MINERALS CH
1.0000 | ORAL_TABLET | Freq: Every day | ORAL | Status: DC
  Administered 2022-03-10 – 2022-03-13 (×4): 1 via ORAL
  Filled 2022-03-09 (×4): qty 1

## 2022-03-09 MED ORDER — ZOLPIDEM TARTRATE 5 MG PO TABS: 5.0000 mg | ORAL_TABLET | Freq: Every evening | ORAL | Status: DC | PRN

## 2022-03-09 MED ORDER — HYDROMORPHONE HCL 1 MG/ML IJ SOLN
0.5000 mg | INTRAMUSCULAR | Status: DC | PRN
  Administered 2022-03-11 – 2022-03-12 (×8): 1 mg via INTRAVENOUS
  Filled 2022-03-09 (×8): qty 1

## 2022-03-09 MED ORDER — MONTELUKAST SODIUM 10 MG PO TABS: 10.0000 mg | ORAL_TABLET | Freq: Every day | ORAL | Status: DC

## 2022-03-09 MED ORDER — ONDANSETRON HCL 4 MG PO TABS: 4.0000 mg | ORAL_TABLET | Freq: Four times a day (QID) | ORAL | Status: DC | PRN

## 2022-03-09 MED ORDER — HYDRALAZINE HCL 20 MG/ML IJ SOLN: 10.0000 mg | INTRAMUSCULAR | Status: DC | PRN

## 2022-03-09 MED ORDER — LORAZEPAM 2 MG/ML IJ SOLN: 1.0000 mg | INTRAMUSCULAR | Status: AC | PRN

## 2022-03-09 MED ORDER — SODIUM CHLORIDE 0.9 % IV SOLN
10.0000 mL/h | Freq: Once | INTRAVENOUS | Status: AC
  Administered 2022-03-09: 10 mL/h via INTRAVENOUS

## 2022-03-09 MED ORDER — LORAZEPAM 1 MG PO TABS: 1.0000 mg | ORAL_TABLET | ORAL | Status: AC | PRN

## 2022-03-09 MED ORDER — ADULT MULTIVITAMIN W/MINERALS CH: 1.0000 | ORAL_TABLET | Freq: Every day | ORAL | Status: DC

## 2022-03-09 MED ORDER — SODIUM CHLORIDE 0.9% FLUSH
3.0000 mL | Freq: Two times a day (BID) | INTRAVENOUS | Status: DC
  Administered 2022-03-09 – 2022-03-13 (×9): 3 mL via INTRAVENOUS

## 2022-03-09 MED ORDER — PIPERACILLIN-TAZOBACTAM 3.375 G IVPB 30 MIN: 3.3750 g | Freq: Three times a day (TID) | INTRAVENOUS | Status: DC

## 2022-03-09 MED ORDER — THIAMINE HCL 100 MG PO TABS: 100.0000 mg | ORAL_TABLET | Freq: Every day | ORAL | Status: DC

## 2022-03-09 MED ORDER — ACETAMINOPHEN 650 MG RE SUPP
650.0000 mg | Freq: Four times a day (QID) | RECTAL | Status: DC | PRN
Start: 2022-03-09 — End: 2022-03-13

## 2022-03-09 MED ORDER — SODIUM CHLORIDE 0.9 % IV SOLN: INTRAVENOUS | Status: DC

## 2022-03-09 MED ORDER — LORAZEPAM 2 MG/ML IJ SOLN: 1.0000 mg | INTRAMUSCULAR | Status: DC | PRN

## 2022-03-09 MED ORDER — FOLIC ACID 1 MG PO TABS: 1.0000 mg | ORAL_TABLET | Freq: Every day | ORAL | Status: DC

## 2022-03-09 MED ORDER — IOHEXOL 350 MG/ML SOLN
100.0000 mL | Freq: Once | INTRAVENOUS | Status: AC | PRN
  Administered 2022-03-09: 100 mL via INTRAVENOUS

## 2022-03-09 MED ORDER — METOCLOPRAMIDE HCL 10 MG PO TABS: 10.0000 mg | ORAL_TABLET | Freq: Three times a day (TID) | ORAL | Status: DC | PRN

## 2022-03-09 MED ORDER — BISACODYL 5 MG PO TBEC: 5.0000 mg | DELAYED_RELEASE_TABLET | Freq: Every day | ORAL | Status: DC | PRN

## 2022-03-09 MED ORDER — SODIUM CHLORIDE 0.9 % IV SOLN: 250.0000 mL | INTRAVENOUS | Status: DC | PRN

## 2022-03-09 MED ORDER — PIPERACILLIN-TAZOBACTAM 3.375 G IVPB
3.3750 g | Freq: Three times a day (TID) | INTRAVENOUS | Status: DC
  Administered 2022-03-09 – 2022-03-10 (×2): 3.375 g via INTRAVENOUS
  Filled 2022-03-09 (×2): qty 50

## 2022-03-09 MED ORDER — NICOTINE 21 MG/24HR TD PT24
21.0000 mg | MEDICATED_PATCH | Freq: Every day | TRANSDERMAL | Status: DC
  Administered 2022-03-09 – 2022-03-13 (×5): 21 mg via TRANSDERMAL
  Filled 2022-03-09 (×5): qty 1

## 2022-03-09 NOTE — Assessment & Plan Note (Addendum)
-   Acute GI bleed, frank bright red blood per rectum ?-03/10/2022 status post EGD: Finding ?Nonbleeding deep duodenal ulcer 30 mm in largest dimension and was circumferential.  This area was deep cavity of close to 2 cm in depth.  Positive for blood in the cavity,- A rounded cirumferential mass was present at the base of the ulcer - the mass was partially covered with fibrin..  Clot was not removed ? ? ?-Gastroenterologist and general surgery Dr. Constance Haw consulted ? ? ?-CT abdomen pelvis reviewed: Inflammatory process in the region of pancreatic head involving second, third portion of duodenum, large roughly 6 cm area of edema with extraluminal air fluid in the region of pancreatic head associated with pneumobilia and CBD, extending into the left lobe of the liver.[name] ?Extensive differential including duodenal ulcer rupture fistula, diverticulum, mass, etc. ?-Dr. Constance Haw recommended Gastrografin studies for further evaluation-which revealed similar finding but no signs of per or fistula ? ? ?-NPO >>> if okay with GI we will advance to clear today ?-IV Protonix ? ? ? ?Hgb 7.4, -S/P 2 units of PRBC >> 8.4 today  ?hypotensive on arrival 87/64 >>112/77  ? ?------------------------------------------------------------------------------- ?Note: Patient was seen at [facility] ED on 03/05/2022 hemoglobin was 6 was transfused 2U PRBC.  ?

## 2022-03-09 NOTE — ED Notes (Signed)
Large amount of bright red blood with clots noted in patient's underwear and soiled pads under him. Patient's skin cleansed with soap and water, dried and fresh brief and linen applied.  ?

## 2022-03-09 NOTE — Assessment & Plan Note (Signed)
-   Currently hypotensive with GI bleed, ?-Home medication of Norvasc, Toprol will be on hold for now ?

## 2022-03-09 NOTE — Assessment & Plan Note (Addendum)
-  No signs of withdrawal yet ?-On CIWA protocol ? ?- History of chronic heavy alcohol abuse per family and patient for > 20 years, in the past few months he has only been drinking on the weekends ?-Last drink was 4 beers the night before admission ?-Discussed with the patient and the family regarding alcohol cessation ?-We will monitor patient closely, initiating CIWA protocol ?

## 2022-03-09 NOTE — ED Provider Notes (Signed)
?[facility] ?Provider Note ? ? ?CSN: [id number] ?Arrival date & time: 03/09/22  8280 ? ?  ? ?History ? ?Chief Complaint  ?Patient presents with  ? Rectal Bleeding  ? ? ?Wesley Francis is a 56 y.o. male. ? ?HPI ?56 year old male presents with rectal bleeding.  He has a chronic history of alcohol abuse.  He was sent to [facility] a few days ago because of a hemoglobin of 6 and abnormal LFTs and they told him to go to a liver center.  There he was told he had heme positive stool and was transfused 2 units of blood and discharged.  This morning patient had a bowel movement with no blood but then later had his clothes soaked in blood and it appeared that it was from a rectal bleeding per patient and wife.  Nurse also tells me that his shirt was soiled with emesis without blood but the patient denies vomiting.  Drink alcohol last night (4 beers per his report).  ? ?Home Medications ?Prior to Admission medications   ?Medication Sig Start Date End Date Taking? Authorizing Provider  ?acetaminophen (TYLENOL) 325 MG tablet Take 650 mg by mouth every 6 (six) hours as needed for mild pain.   Yes [provider]  ?albuterol (PROVENTIL HFA;VENTOLIN HFA) 108 (90 Base) MCG/ACT inhaler Inhale 2 puffs into the lungs every 6 (six) hours as needed for wheezing or shortness of breath. 04/06/16  Yes Rexene Alberts, MD  ?Cholecalciferol (VITAMIN D) 2000 units CAPS Take 1 capsule by mouth every other day.   Yes [provider]  ?metoCLOPramide (REGLAN) 10 MG tablet Take 1 tablet (10 mg total) by mouth 3 (three) times daily as needed (hiccups). 02/15/22  Yes Margarita Mail, PA-C  ?metoprolol succinate (TOPROL-XL) 50 MG 24 hr tablet Take 50 mg by mouth daily. Take with or immediately following a meal.   Yes [provider]  ?montelukast (SINGULAIR) 10 MG tablet Take 10 mg by mouth daily.   Yes [provider]  ?pantoprazole (PROTONIX) 40 MG tablet Take 40 mg by mouth daily.   Yes [provider]  ?thiamine 100 MG tablet Take 1 tablet (100 mg total) by mouth daily. 12/23/17  Yes Shah, Pratik D, DO  ?ibuprofen (ADVIL,MOTRIN) 600 MG tablet Take 1 tablet (600 mg total) by mouth every 6 (six) hours as needed. ?Patient not taking: Reported on 03/09/2022 08/21/17   Sable Feil, PA-C  ?nicotine (NICODERM CQ - DOSED IN MG/24 HOURS) 21 mg/24hr patch Place 1 patch (21 mg total) onto the skin daily. ?Patient not taking: Reported on 03/09/2022 12/23/17   Heath Lark D, DO  ?potassium chloride SA (K-DUR,KLOR-CON) 20 MEQ tablet Take 1 tablet (20 mEq total) by mouth daily. ?Patient not taking: Reported on 03/09/2022 02/22/19   Fredia Sorrow, MD  ?   ? ?Allergies    ?Patient has no known allergies.   ? ?Review of Systems   ?Review of Systems  ?Gastrointestinal:  Positive for blood in stool and vomiting. Negative for abdominal pain.  ?Neurological:  Positive for light-headedness.  ? ?Physical Exam ?Updated Vital Signs ?BP 134/78   Pulse 97   Temp 97.9 ?F (36.6 ?C) (Oral)   Resp 20   Ht '5\' 6"'$  (1.676 m)   Wt 63.5 kg   SpO2 100%   BMI 22.60 kg/m?  ?Physical Exam ?Vitals and nursing note reviewed.  ?Constitutional:   ?   Appearance: He is well-developed.  ?HENT:  ?   Head: Normocephalic  and atraumatic.  ?Cardiovascular:  ?   Rate and Rhythm: Normal rate and regular rhythm.  ?   Heart sounds: Normal heart sounds.  ?Pulmonary:  ?   Effort: Pulmonary effort is normal.  ?   Breath sounds: Normal breath sounds.  ?Abdominal:  ?   General: There is no distension.  ?   Palpations: Abdomen is soft.  ?   Tenderness: There is no abdominal tenderness.  ?Genitourinary: ?   Comments: Patient has blood clots in his underwear and his clothes are soiled with blood.  ?Skin: ?   General: Skin is warm and dry.  ?Neurological:  ?   Mental Status: He is alert.  ? ? ?ED Results / Procedures / Treatments   ?Labs ?(all labs ordered are listed, but only abnormal results are displayed) ?Labs Reviewed  ?COMPREHENSIVE METABOLIC PANEL -  Abnormal; Notable for the following components:  ?    Result Value  ? CO2 21 (*)   ? Glucose, Bld 102 (*)   ? Calcium 8.5 (*)   ? Total Protein 5.9 (*)   ? Albumin 3.0 (*)   ? ALT 56 (*)   ? Alkaline Phosphatase 207 (*)   ? Total Bilirubin 2.5 (*)   ? All other components within normal limits  ?CBC WITH DIFFERENTIAL/PLATELET - Abnormal; Notable for the following components:  ? WBC 17.8 (*)   ? RBC 2.67 (*)   ? Hemoglobin 7.4 (*)   ? HCT 24.3 (*)   ? RDW 18.8 (*)   ? nRBC 0.3 (*)   ? Neutro Abs 15.7 (*)   ? Abs Immature Granulocytes 0.17 (*)   ? All other components within normal limits  ?BRAIN NATRIURETIC PEPTIDE - Abnormal; Notable for the following components:  ? B Natriuretic Peptide 608.0 (*)   ? All other components within normal limits  ?PROTIME-INR  ?ETHANOL  ?HIV ANTIBODY (ROUTINE TESTING W REFLEX)  ?TYPE AND SCREEN  ?PREPARE RBC (CROSSMATCH)  ?ABO/RH  ? ? ?EKG ?None ? ?Radiology ?No results found. ? ?Procedures ?Marland KitchenCritical Care ?Performed by: Sherwood Gambler, MD ?Authorized by: Sherwood Gambler, MD  ? ?Critical care provider statement:  ?  Critical care time (minutes):  45 ?  Critical care time was exclusive of:  Separately billable procedures and treating other patients ?  Critical care was necessary to treat or prevent imminent or life-threatening deterioration of the following conditions:  Circulatory failure ?  Critical care was time spent personally by me on the following activities:  Development of treatment plan with patient or surrogate, discussions with consultants, evaluation of patient's response to treatment, examination of patient, ordering and review of laboratory studies, ordering and review of radiographic studies, ordering and performing treatments and interventions, pulse oximetry, re-evaluation of patient's condition and review of old charts  ? ? ?Medications Ordered in ED ?Medications  ?sodium chloride flush (NS) 0.9 % injection 3 mL (has no administration in time range)  ?0.9 %  sodium  chloride infusion (has no administration in time range)  ?sodium chloride flush (NS) 0.9 % injection 3 mL (has no administration in time range)  ?sodium chloride flush (NS) 0.9 % injection 3 mL (has no administration in time range)  ?0.9 %  sodium chloride infusion (has no administration in time range)  ?acetaminophen (TYLENOL) tablet 650 mg (has no administration in time range)  ?  Or  ?acetaminophen (TYLENOL) suppository 650 mg (has no administration in time range)  ?oxyCODONE (Oxy IR/ROXICODONE) immediate release tablet 5 mg (has no  administration in time range)  ?HYDROmorphone (DILAUDID) injection 0.5-1 mg (has no administration in time range)  ?zolpidem (AMBIEN) tablet 5 mg (has no administration in time range)  ?senna-docusate (Senokot-S) tablet 1 tablet (has no administration in time range)  ?bisacodyl (DULCOLAX) EC tablet 5 mg (has no administration in time range)  ?ondansetron (ZOFRAN) tablet 4 mg (has no administration in time range)  ?  Or  ?ondansetron (ZOFRAN) injection 4 mg (has no administration in time range)  ?ipratropium (ATROVENT) nebulizer solution 0.5 mg (has no administration in time range)  ?levalbuterol (XOPENEX) nebulizer solution 0.63 mg (has no administration in time range)  ?hydrALAZINE (APRESOLINE) injection 10 mg (has no administration in time range)  ?metoprolol succinate (TOPROL-XL) 24 hr tablet 50 mg (has no administration in time range)  ?metoCLOPramide (REGLAN) tablet 10 mg (has no administration in time range)  ?montelukast (SINGULAIR) tablet 10 mg (has no administration in time range)  ?iohexol (OMNIPAQUE) 9 MG/ML oral solution (has no administration in time range)  ?pantoprazole (PROTONIX) injection 40 mg (has no administration in time range)  ?nicotine (NICODERM CQ - dosed in mg/24 hours) patch 21 mg (has no administration in time range)  ?LORazepam (ATIVAN) tablet 1-4 mg (has no administration in time range)  ?  Or  ?LORazepam (ATIVAN) injection 1-4 mg (has no administration  in time range)  ?thiamine tablet 100 mg (has no administration in time range)  ?  Or  ?thiamine (B-1) injection 100 mg (has no administration in time range)  ?folic acid (FOLVITE) tablet 1 mg (has no administration i

## 2022-03-09 NOTE — ED Notes (Signed)
Used bed pan. Approx 524m bright red blood noted in bedpan.  ?

## 2022-03-09 NOTE — Consult Note (Signed)
[facility] Consult ? ?Reason for Consult: Pneumobilia, GIB, ? Duodenal perforation  ?Referring Physician:  Dr. Verner Chol ? ?Chief Complaint   ?Rectal Bleeding ?  ? ? ?HPI: Wesley Francis is a 56 y.o. male with alcohol abuse, liver cirrhosis, recent reported rectal bleeding. He had been seen at [facility] department for this and had labs that showed a low Hgb and he was sent to the ED there 3/30 where he received 2U PRBC. He says at that time there was no bright red blood per rectum. He denies any abdominal pain or issues with nausea or  vomiting but was seen in the Ed a few weeks ago with hiccups.  ? ?He had a CT scan done in the ED today that demonstrates pneumobilia with concern for retroperitoneal air versus diverticulum. He had prior Colonoscopy and EGD with Dr. Haig Prophet in 2021 with internal hemorrhoids and poor preparation and normal EGD.  ? ?He says he hurts when you push on his RUQ now. He denies NSAID use. He is here with his family today.  ? ?Past Medical History:  ?Diagnosis Date  ? AKI (acute kidney injury) ([place]) 04/04/2016  ? Anemia   ? Arthritis   ? Asthma   ? ETOH abuse   ? Folate deficiency 04/05/2016  ? Heme positive stool 04/05/2016  ? Hypertension   ? ? ?Past Surgical History:  ?Procedure Laterality Date  ? COLONOSCOPY    ? COLONOSCOPY WITH PROPOFOL N/A 03/02/2017  ? Procedure: COLONOSCOPY WITH PROPOFOL;  Surgeon: Lollie Sails, MD;  Location: [facility] ENDOSCOPY;  Service: Endoscopy;  Laterality: N/A;  ? COLONOSCOPY WITH PROPOFOL N/A 07/08/2020  ? Procedure: COLONOSCOPY WITH PROPOFOL;  Surgeon: Lesly Rubenstein, MD;  Location: [facility] ENDOSCOPY;  Service: Endoscopy;  Laterality: N/A;  ? ESOPHAGOGASTRODUODENOSCOPY (EGD) WITH PROPOFOL N/A 07/08/2020  ? Procedure: ESOPHAGOGASTRODUODENOSCOPY (EGD) WITH PROPOFOL;  Surgeon: Lesly Rubenstein, MD;  Location: [facility] ENDOSCOPY;  Service: Endoscopy;  Laterality: N/A;  ? FRACTURE SURGERY    ? KNEE ARTHROCENTESIS    ? ? ?Family  History  ?Problem Relation Age of Onset  ? Hypertension Mother   ? Stroke Mother   ? Hypertension Father   ? ? ?Social History  ? ?Tobacco Use  ? Smoking status: Every Day  ?  Packs/day: 0.50  ?  Types: Cigarettes  ? Smokeless tobacco: Never  ?Vaping Use  ? Vaping Use: Never used  ?Substance Use Topics  ? Alcohol use: Yes  ?  Alcohol/week: 4.0 standard drinks  ?  Types: 4 Cans of beer per week  ?  Comment: yesterday 03/09/22  ? Drug use: No  ? ? ?Medications: I have reviewed the patient's current medications. ?Prior to Admission: (Not in a hospital admission) ? ?Scheduled: ? folic acid  1 mg Oral Daily  ? metoprolol succinate  50 mg Oral Daily  ? montelukast  10 mg Oral QHS  ? multivitamin with minerals  1 tablet Oral Daily  ? sodium chloride flush  3 mL Intravenous Q12H  ? sodium chloride flush  3 mL Intravenous Q12H  ? thiamine  100 mg Oral Daily  ? ?Continuous: ? sodium chloride    ? sodium chloride    ? dexmedetomidine (PRECEDEX) IV infusion    ? ?CBJ:SEGBTD chloride, acetaminophen **OR** acetaminophen, bisacodyl, hydrALAZINE, HYDROmorphone (DILAUDID) injection, ipratropium, levalbuterol, LORazepam, metoCLOPramide, ondansetron **OR** ondansetron (ZOFRAN) IV, oxyCODONE, senna-docusate, sodium chloride flush, zolpidem ? ?No Known Allergies ? ? ?ROS:  ?A comprehensive review of systems was negative except  for: Gastrointestinal: positive for BRBPR ? ?Blood pressure 114/81, pulse 93, temperature 98 ?F (36.7 ?C), resp. rate 16, height '5\' 6"'$  (1.676 m), weight 63.5 kg, SpO2 100 %. ?Physical Exam ?Vitals reviewed.  ?Constitutional:   ?   Appearance: Normal appearance.  ?HENT:  ?   Head: Normocephalic.  ?   Nose: Nose normal.  ?   Mouth/Throat:  ?   Mouth: Mucous membranes are moist.  ?Eyes:  ?   Extraocular Movements: Extraocular movements intact.  ?Cardiovascular:  ?   Rate and Rhythm: Normal rate.  ?Pulmonary:  ?   Effort: Pulmonary effort is normal.  ?Abdominal:  ?   General: There is no distension.  ?   Palpations:  Abdomen is soft.  ?   Tenderness: There is abdominal tenderness. There is no guarding or rebound.  ?   Comments: RUQ tenderness with deep palpation   ?Musculoskeletal:     ?   General: Normal range of motion.  ?   Cervical back: No rigidity.  ?   Right lower leg: No edema.  ?   Left lower leg: No edema.  ?Skin: ?   General: Skin is warm.  ?Neurological:  ?   General: No focal deficit present.  ?   Mental Status: He is alert and oriented to person, place, and time.  ?Psychiatric:     ?   Mood and Affect: Mood normal.     ?   Behavior: Behavior normal.  ? ? ?Results: ?Results for orders placed or performed during the hospital encounter of 03/09/22 (from the past 48 hour(s))  ?Comprehensive metabolic panel     Status: Abnormal  ? Collection Time: 03/09/22  9:55 AM  ?Result Value Ref Range  ? Sodium 141 135 - 145 mmol/L  ? Potassium 4.0 3.5 - 5.1 mmol/L  ? Chloride 110 98 - 111 mmol/L  ? CO2 21 (L) 22 - 32 mmol/L  ? Glucose, Bld 102 (H) 70 - 99 mg/dL  ?  Comment: Glucose reference range applies only to samples taken after fasting for at least 8 hours.  ? BUN 10 6 - 20 mg/dL  ? Creatinine, Ser 0.97 0.61 - 1.24 mg/dL  ? Calcium 8.5 (L) 8.9 - 10.3 mg/dL  ? Total Protein 5.9 (L) 6.5 - 8.1 g/dL  ? Albumin 3.0 (L) 3.5 - 5.0 g/dL  ? AST 40 15 - 41 U/L  ? ALT 56 (H) 0 - 44 U/L  ? Alkaline Phosphatase 207 (H) 38 - 126 U/L  ? Total Bilirubin 2.5 (H) 0.3 - 1.2 mg/dL  ? GFR, Estimated >60 >60 mL/min  ?  Comment: (NOTE) ?Calculated using the CKD-EPI Creatinine Equation (2021) ?  ? Anion gap 10 5 - 15  ?  Comment: Performed at [facility], [address], [place], [place] [address]  ?CBC with Differential     Status: Abnormal  ? Collection Time: 03/09/22  9:55 AM  ?Result Value Ref Range  ? WBC 17.8 (H) 4.0 - 10.5 K/uL  ? RBC 2.67 (L) 4.22 - 5.81 MIL/uL  ? Hemoglobin 7.4 (L) 13.0 - 17.0 g/dL  ? HCT 24.3 (L) 39.0 - 52.0 %  ? MCV 91.0 80.0 - 100.0 fL  ? MCH 27.7 26.0 - 34.0 pg  ? MCHC 30.5 30.0 - 36.0 g/dL  ? RDW 18.8 (H) 11.5 - 15.5 %   ? Platelets 270 150 - 400 K/uL  ? nRBC 0.3 (H) 0.0 - 0.2 %  ? Neutrophils Relative % 88 %  ?  Neutro Abs 15.7 (H) 1.7 - 7.7 K/uL  ? Lymphocytes Relative 5 %  ? Lymphs Abs 0.8 0.7 - 4.0 K/uL  ? Monocytes Relative 6 %  ? Monocytes Absolute 1.0 0.1 - 1.0 K/uL  ? Eosinophils Relative 0 %  ? Eosinophils Absolute 0.0 0.0 - 0.5 K/uL  ? Basophils Relative 0 %  ? Basophils Absolute 0.0 0.0 - 0.1 K/uL  ? Immature Granulocytes 1 %  ? Abs Immature Granulocytes 0.17 (H) 0.00 - 0.07 K/uL  ?  Comment: Performed at [facility], [address], [place], [place] [address]  ?Protime-INR     Status: None  ? Collection Time: 03/09/22  9:55 AM  ?Result Value Ref Range  ? Prothrombin Time 13.7 11.4 - 15.2 seconds  ? INR 1.1 0.8 - 1.2  ?  Comment: (NOTE) ?INR goal varies based on device and disease states. ?Performed at [facility], [address] [place], [place] [address] ?  ?Type and screen [facility]     Status: None (Preliminary result)  ? Collection Time: 03/09/22  9:55 AM  ?Result Value Ref Range  ? ABO/RH(D) A POS   ? Antibody Screen NEG   ? Sample Expiration 03/12/2022,2359   ? Unit Number [id number]   ? Blood Component Type RED CELLS,LR   ? Unit division 00   ? Status of Unit ALLOCATED   ? Transfusion Status OK TO TRANSFUSE   ? Crossmatch Result    ?  Compatible ?Performed at [facility], [address], [place], [place] [address] ?  ?Prepare RBC (crossmatch)     Status: None  ? Collection Time: 03/09/22  9:55 AM  ?Result Value Ref Range  ? Order Confirmation    ?  ORDER PROCESSED BY BLOOD BANK ?Performed at [facility], [address] [place], [place] [address] ?  ?Brain natriuretic peptide     Status: Abnormal  ? Collection Time: 03/09/22  9:55 AM  ?Result Value Ref Range  ? B Natriuretic Peptide 608.0 (H) 0.0 - 100.0 pg/mL  ?  Comment: Performed at [facility], [address], [place], [place] [address]  ?Ethanol     Status: None  ? Collection Time: 03/09/22  9:56 AM  ?Result Value Ref Range  ? Alcohol, Ethyl (B)  <10 <10 mg/dL  ?  Comment: (NOTE) ?Lowest detectable limit for serum alcohol is 10 mg/dL. ? ?For medical purposes only. ?Performed at [facility], [address], [place], [place] [address] ?  ?ABO/Rh

## 2022-03-09 NOTE — ED Notes (Signed)
Report attempted to CCU. Nurse unavailable to take report at this time.  ?

## 2022-03-09 NOTE — ED Triage Notes (Signed)
Pt to the ED with complaints of rectal bleeding that restarted this morning. ? ?Pt had a blood transfusion last week at [facility]. He initially had a BP 70/40, and the pt had a syncopal episode while getting on the stretcher. ?

## 2022-03-09 NOTE — Assessment & Plan Note (Signed)
-   Approxi-1 pack a day, ?-Discussed with patient and family regarding smoking cessation ?-NicoDerm patch will be provided ?

## 2022-03-09 NOTE — Consult Note (Addendum)
? ?Gastroenterology Consult  ? ?Referring Provider: No ref. provider found ?Primary Care Physician:  Jerel Shepherd, [place] ?Primary Gastroenterologist:  Dr. [name] ?Patient ID: Wesley Francis; [id number]; 12/26/1965  ? ?Admit date: 03/09/2022 ? LOS: 0 days  ? ?Date of Consultation: 03/09/2022 ? ?Reason for Consultation:  GI bleed  ? ?History of Present Illness  ? ?Wesley Francis is a 56 y.o. year old male with history of alcohol abuse, IDA in the past presenting this morning with reports of rectal bleeding. Hypotensive on arrival with BP 70/40 per nursing notes. New onset overt GI bleeding this morning. Previously seen  on 03/05/22 by [facility] but note is not available at this time. Routine labs with Hgb 6.0 on 3/30. Received blood transfusion 2 units PRBCs at outside facility. LFTs at that time with Tbili 3.9, AST 123, ALT 199, Alk Phos 383. Additional seroogies with normal ceruloplasmin, negative ANA, ferritin elevated at 1417. Iron sats 76. Presented to ED with hypotension and rectal bleeding this morning. Hgb 7.4. INR 1.1 Last colonoscopy/EGD Aug 2021 by Dr. Haig Prophet with poor prep and internal hemorrhoids. EGD normal.  ? ? ?CTA with inflammatory process at pancreatic head and involving second and third portions of duodenum, pneumobilia in CBD extending into left lobe of liver, unclear etiology at this time with differentials broad including ruptured duodenal ulcer, diverticulum, malignancy, etc. Surgery at bedside at time of consultation and discussing with patient. ? ?Patient denies any obvious overt GI bleeding until today. Mild abdominal discomfort but not significant. Denies NSAIDs. Drinks beer.  ? ? ?1 unit PRBCs ordered. Hemodynamically stable.  ? ?Past Medical History:  ?Diagnosis Date  ? AKI (acute kidney injury) ([place]) 04/04/2016  ? Anemia   ? Arthritis   ? Asthma   ? ETOH abuse   ? Folate deficiency 04/05/2016  ? Heme positive stool 04/05/2016  ? Hypertension   ? ? ?Past Surgical History:  ?Procedure  Laterality Date  ? COLONOSCOPY    ? COLONOSCOPY WITH PROPOFOL N/A 03/02/2017  ? Procedure: COLONOSCOPY WITH PROPOFOL;  Surgeon: Lollie Sails, MD;  Location: [facility] ENDOSCOPY;  Service: Endoscopy;  Laterality: N/A;  ? COLONOSCOPY WITH PROPOFOL N/A 07/08/2020  ? Procedure: COLONOSCOPY WITH PROPOFOL;  Surgeon: Lesly Rubenstein, MD;  Location: [facility] ENDOSCOPY;  Service: Endoscopy;  Laterality: N/A;  ? ESOPHAGOGASTRODUODENOSCOPY (EGD) WITH PROPOFOL N/A 07/08/2020  ? Procedure: ESOPHAGOGASTRODUODENOSCOPY (EGD) WITH PROPOFOL;  Surgeon: Lesly Rubenstein, MD;  Location: [facility] ENDOSCOPY;  Service: Endoscopy;  Laterality: N/A;  ? FRACTURE SURGERY    ? KNEE ARTHROCENTESIS    ? ? ?Prior to Admission medications   ?Medication Sig Start Date End Date Taking? Authorizing Provider  ?acetaminophen (TYLENOL) 325 MG tablet Take 650 mg by mouth every 6 (six) hours as needed for mild pain.   Yes [provider]  ?albuterol (PROVENTIL HFA;VENTOLIN HFA) 108 (90 Base) MCG/ACT inhaler Inhale 2 puffs into the lungs every 6 (six) hours as needed for wheezing or shortness of breath. 04/06/16  Yes Rexene Alberts, MD  ?Cholecalciferol (VITAMIN D) 2000 units CAPS Take 1 capsule by mouth every other day.   Yes [provider]  ?metoCLOPramide (REGLAN) 10 MG tablet Take 1 tablet (10 mg total) by mouth 3 (three) times daily as needed (hiccups). 02/15/22  Yes Margarita Mail, PA-C  ?metoprolol succinate (TOPROL-XL) 50 MG 24 hr tablet Take 50 mg by mouth daily. Take with or immediately following a meal.   Yes [provider]  ?montelukast (SINGULAIR) 10 MG tablet  Take 10 mg by mouth daily.   Yes [provider]  ?pantoprazole (PROTONIX) 40 MG tablet Take 40 mg by mouth daily.   Yes [provider]  ?thiamine 100 MG tablet Take 1 tablet (100 mg total) by mouth daily. 12/23/17  Yes Shah, Pratik D, DO  ?ibuprofen (ADVIL,MOTRIN) 600 MG tablet Take 1 tablet (600 mg total) by mouth every 6 (six) hours as  needed. ?Patient not taking: Reported on 03/09/2022 08/21/17   Sable Feil, PA-C  ?nicotine (NICODERM CQ - DOSED IN MG/24 HOURS) 21 mg/24hr patch Place 1 patch (21 mg total) onto the skin daily. ?Patient not taking: Reported on 03/09/2022 12/23/17   Heath Lark D, DO  ?potassium chloride SA (K-DUR,KLOR-CON) 20 MEQ tablet Take 1 tablet (20 mEq total) by mouth daily. ?Patient not taking: Reported on 03/09/2022 02/22/19   Fredia Sorrow, MD  ? ? ?Current Facility-Administered Medications  ?Medication Dose Route Frequency Provider Last Rate Last Admin  ? 0.9 %  sodium chloride infusion   Intravenous Continuous Shahmehdi, Seyed A, MD      ? 0.9 %  sodium chloride infusion  250 mL Intravenous PRN Shahmehdi, Seyed A, MD      ? acetaminophen (TYLENOL) tablet 650 mg  650 mg Oral Q6H PRN Shahmehdi, Seyed A, MD      ? Or  ? acetaminophen (TYLENOL) suppository 650 mg  650 mg Rectal Q6H PRN Shahmehdi, Seyed A, MD      ? bisacodyl (DULCOLAX) EC tablet 5 mg  5 mg Oral Daily PRN Shahmehdi, Seyed A, MD      ? dexmedetomidine (PRECEDEX) 400 MCG/100ML (4 mcg/mL) infusion  0.2-0.7 mcg/kg/hr Intravenous Continuous Shahmehdi, Seyed A, MD      ? folic acid (FOLVITE) tablet 1 mg  1 mg Oral Daily Shahmehdi, Seyed A, MD      ? hydrALAZINE (APRESOLINE) injection 10 mg  10 mg Intravenous Q4H PRN Shahmehdi, Seyed A, MD      ? HYDROmorphone (DILAUDID) injection 0.5-1 mg  0.5-1 mg Intravenous Q2H PRN Shahmehdi, Seyed A, MD      ? ipratropium (ATROVENT) nebulizer solution 0.5 mg  0.5 mg Nebulization Q6H PRN Shahmehdi, Seyed A, MD      ? levalbuterol (XOPENEX) nebulizer solution 0.63 mg  0.63 mg Nebulization Q6H PRN Shahmehdi, Seyed A, MD      ? LORazepam (ATIVAN) injection 1-2 mg  1-2 mg Intravenous Q1H PRN Shahmehdi, Seyed A, MD      ? multivitamin with minerals tablet 1 tablet  1 tablet Oral Daily Shahmehdi, Seyed A, MD      ? ondansetron (ZOFRAN) tablet 4 mg  4 mg Oral Q6H PRN Shahmehdi, Seyed A, MD      ? Or  ? ondansetron (ZOFRAN) injection 4  mg  4 mg Intravenous Q6H PRN Shahmehdi, Seyed A, MD      ? oxyCODONE (Oxy IR/ROXICODONE) immediate release tablet 5 mg  5 mg Oral Q4H PRN Shahmehdi, Seyed A, MD      ? piperacillin-tazobactam (ZOSYN) IVPB 3.375 g  3.375 g Intravenous Once Sherwood Gambler, MD 100 mL/hr at 03/09/22 1436 3.375 g at 03/09/22 1436  ? senna-docusate (Senokot-S) tablet 1 tablet  1 tablet Oral QHS PRN Shahmehdi, Seyed A, MD      ? sodium chloride flush (NS) 0.9 % injection 3 mL  3 mL Intravenous Q12H Shahmehdi, Seyed A, MD      ? sodium chloride flush (NS) 0.9 % injection 3 mL  3 mL Intravenous Q12H  Shahmehdi, Seyed A, MD      ? sodium chloride flush (NS) 0.9 % injection 3 mL  3 mL Intravenous PRN Shahmehdi, Seyed A, MD      ? thiamine tablet 100 mg  100 mg Oral Daily Shahmehdi, Seyed A, MD      ? zolpidem (AMBIEN) tablet 5 mg  5 mg Oral QHS PRN,MR X 1 Shahmehdi, Seyed A, MD      ? ?Current Outpatient Medications  ?Medication Sig Dispense Refill  ? acetaminophen (TYLENOL) 325 MG tablet Take 650 mg by mouth every 6 (six) hours as needed for mild pain.    ? albuterol (PROVENTIL HFA;VENTOLIN HFA) 108 (90 Base) MCG/ACT inhaler Inhale 2 puffs into the lungs every 6 (six) hours as needed for wheezing or shortness of breath. 1 Inhaler 2  ? Cholecalciferol (VITAMIN D) 2000 units CAPS Take 1 capsule by mouth every other day.    ? metoCLOPramide (REGLAN) 10 MG tablet Take 1 tablet (10 mg total) by mouth 3 (three) times daily as needed (hiccups). 20 tablet 0  ? metoprolol succinate (TOPROL-XL) 50 MG 24 hr tablet Take 50 mg by mouth daily. Take with or immediately following a meal.    ? montelukast (SINGULAIR) 10 MG tablet Take 10 mg by mouth daily.    ? pantoprazole (PROTONIX) 40 MG tablet Take 40 mg by mouth daily.    ? thiamine 100 MG tablet Take 1 tablet (100 mg total) by mouth daily. 30 tablet 0  ? ibuprofen (ADVIL,MOTRIN) 600 MG tablet Take 1 tablet (600 mg total) by mouth every 6 (six) hours as needed. (Patient not taking: Reported on 03/09/2022)  30 tablet 0  ? nicotine (NICODERM CQ - DOSED IN MG/24 HOURS) 21 mg/24hr patch Place 1 patch (21 mg total) onto the skin daily. (Patient not taking: Reported on 03/09/2022) 28 patch 0  ? potassium chloride SA (K-DUR,KLOR

## 2022-03-09 NOTE — Hospital Course (Addendum)
? ?  Wesley Francis is a 56 year old male with extensive history of HTN, chronic alcohol abuse, liver cirrhosis, arthritis, asthma, iron deficiency, chronic tobacco abuse, seasonal allergies presenting with acute rectal bleed. ? ?Patient reports his initial bowel movement in the morning was nonbloody, subsequently noticed frank bright red blood per rectum, according to the patient was massive. ? ?He admits that he is drinking alcohol only on the weekend. But he has history of heavy drinking drinking for over 20 yrs..  Possible having liver cirrhosis now... Was referred to a liver specialist at [facility] - he was seen on 03/05/2022, his  lab was checked subsequently noted for hemoglobin of 6.0, he was sent to the ED there and was transfused with 2U PRBC. ? ?He reports of no previous history of GI bleed.  Last EGD, colonoscopy was in 2021, EGD was normal with no report of esophageal varices, colonoscopy reported external hemorrhoid again no evidence of varices at the time. ? ?-He has undergone EGD 4/4 with findings of nonbleeding deep duodenal ulcer and a rounded circumferential mass at the base of the ulcer.  Multiple biopsies have been taken with no findings of malignancy noted as of yet.  Plans for further endoscopy in outpatient setting.  Continue PPI infusion and IV antibiotics. ?

## 2022-03-09 NOTE — Assessment & Plan Note (Addendum)
-   Arrival blood pressure was 87/64, status post IV fluid resuscitation initiating PRBC transfusion, blood pressure improved now 114/81 ?-We will monitor closely ?-Much improved and stable this a.m. ?

## 2022-03-09 NOTE — Assessment & Plan Note (Addendum)
-   Continue iron supplements ?-Hypotensive with hemoglobin of 7.4 >> 6.8 >>>> 8.4 ?- status post 2U PRBC in ED today 03/09/2022 ?

## 2022-03-09 NOTE — Assessment & Plan Note (Addendum)
Stable ectatic iris- ?- Per patient and family Long history of alcohol abuse has cut down now drinks only on weekends ?Was diagnosed by PCP for liver cirrhosis referral to [facility] for evaluation ?- follow-up with an outpatient with [facility] liver specialist ?-Was seen at [facility] on 03/05/2022 LFTs: AST 123, ALT 199, alk phos was 383, serial plasm, negative ANA, ferritin elevated 1417, iron saturation was 76 ? ?-Monitoring LFTs:  ?liver function tests ? ?  Latest Ref Rng & Units 03/10/2022  ?  3:57 AM 03/09/2022  ?  9:55 AM 12/22/2017  ?  4:41 AM  ?Hepatic Function  ?Total Protein 6.5 - 8.1 g/dL 5.3   5.9   6.4    ?Albumin 3.5 - 5.0 g/dL 2.7   3.0   3.4    ?AST 15 - 41 U/L 22   40   30    ?ALT 0 - 44 U/L 39   56   18    ?Alk Phosphatase 38 - 126 U/L 134   207   28    ?Total Bilirubin 0.3 - 1.2 mg/dL 2.9   2.5   0.6    ? ?Avoiding hepatotoxins.. ?

## 2022-03-09 NOTE — Progress Notes (Addendum)
[facility] ? ?Oral contrast CT with no bowel perforation. Pneumobilia Of unknown etiology, ? Ulcer, fistula, mass/cancer?. ? ?Can have clears and oral meds, updated GI team and hospitalist, RN. ?Will need ERCP, notified Dr. Laural Golden.  ? ?Curlene Labrum, MD ?

## 2022-03-09 NOTE — Assessment & Plan Note (Addendum)
-   Likely reactive due to acute GI bleed ?-WBC 17.8, neutrophil 15.7 >>> WBC 6.8 today  ?-Empiric IV antibiotic Zosyn initiated by ED was continued >> will DC today ?

## 2022-03-09 NOTE — H&P (Addendum)
?History and Physical  ? ?Patient: Wesley Francis                            PCP: Jerel Shepherd, Amber                    ?DOB: [birth year]            DOA: 03/09/2022 ?ZDG:[id number]             DOS: 03/09/2022, 3:57 PM ? ?Jerel Shepherd, FNP ? ?Patient coming from:   HOME  ?I have personally reviewed patient's medical records, in electronic medical records, including:  ?Wesley Francis, and care everywhere.  ? ? ?Chief Complaint:  ? ?Chief Complaint  ?Patient presents with  ? Rectal Bleeding  ? ? ?History of present illness:  ? ? ? ? ?Wesley Francis is a 56 year old male with extensive history of HTN, chronic alcohol abuse, liver cirrhosis, arthritis, asthma, iron deficiency, chronic tobacco abuse, seasonal allergies presenting with acute rectal bleed. ? ?Patient reports his initial bowel movement in the morning was nonbloody, subsequently noticed frank bright red blood per rectum, according to the patient was massive. ? ?He admits that he is drinking alcohol only on the weekend. But he has history of heavy drinking drinking for over 20 yrs..  Possible having liver cirrhosis now... Was referred to a liver specialist at [facility] [place] - he was seen on 03/05/2022, his  lab was checked subsequently noted for hemoglobin of 6.0, he was sent to the ED there and was transfused with 2U PRBC. ? ?He reports of no previous history of GI bleed.  Last EGD, colonoscopy was in 2021, EGD was normal with no report of esophageal varices, colonoscopy reported external hemorrhoid again no evidence of varices at the time. ? ? ?ED: ?Blood pressure 114/81, pulse 93, temperature 98 ?F (36.7 ?C), resp. rate 16, height '5\' 6"'$  (1.676 m), weight 63.5 kg, SpO2 100 %. ? ?Patient was found hypotensive on arrival with blood pressure of 87/64, extensive bright red blood and clot was noted in the rectal area by the ED staff ? ?Labs:  ?CBC WBC 17.8, hemoglobin 7.4, hematocrit 24.3, CO2 21, calcium 8.5, BUN 10, creatinine 0.97, glucose 107, BNP  608, ? ?CT abdomen pelvis was reviewed with Dr. Constance Haw: Demonstrating pneumobilia which is a concern for retroperitoneal air vs diverticulum vs mass vs ufistula ?General surgery Dr. Constance Haw and GI  was consulted for further evaluation ?Requested admission ? ? ? Patient Denies having: Fever, Chills, Cough, SOB, Chest Pain, Abd pain, N/V/D, headache, dizziness, lightheadedness,  Dysuria, Joint pain, rash, open wounds ? ? ? ?Review of Systems: As per HPI, otherwise 10 point review of systems were negative.  ? ?---------------------------------------------------------------------------------------------------------------------- ? ?No Known Allergies ? ?Home MEDs:  ?Prior to Admission medications   ?Medication Sig Start Date End Date Taking? Authorizing Provider  ?acetaminophen (TYLENOL) 325 MG tablet Take 650 mg by mouth every 6 (six) hours as needed for mild pain.   Yes [provider]  ?albuterol (PROVENTIL HFA;VENTOLIN HFA) 108 (90 Base) MCG/ACT inhaler Inhale 2 puffs into the lungs every 6 (six) hours as needed for wheezing or shortness of breath. 04/06/16  Yes Rexene Alberts, MD  ?Cholecalciferol (VITAMIN D) 2000 units CAPS Take 1 capsule by mouth every other day.   Yes [provider]  ?metoCLOPramide (REGLAN) 10 MG tablet Take 1 tablet (10 mg total) by mouth 3 (three) times  daily as needed (hiccups). 02/15/22  Yes Margarita Mail, PA-C  ?metoprolol succinate (TOPROL-XL) 50 MG 24 hr tablet Take 50 mg by mouth daily. Take with or immediately following a meal.   Yes [provider]  ?montelukast (SINGULAIR) 10 MG tablet Take 10 mg by mouth daily.   Yes [provider]  ?pantoprazole (PROTONIX) 40 MG tablet Take 40 mg by mouth daily.   Yes [provider]  ?thiamine 100 MG tablet Take 1 tablet (100 mg total) by mouth daily. 12/23/17  Yes Shah, Pratik D, DO  ?ibuprofen (ADVIL,MOTRIN) 600 MG tablet Take 1 tablet (600 mg total) by mouth every 6 (six) hours as needed. ?Patient  not taking: Reported on 03/09/2022 08/21/17   Sable Feil, PA-C  ?nicotine (NICODERM CQ - DOSED IN MG/24 HOURS) 21 mg/24hr patch Place 1 patch (21 mg total) onto the skin daily. ?Patient not taking: Reported on 03/09/2022 12/23/17   Heath Lark D, DO  ?potassium chloride SA (K-DUR,KLOR-CON) 20 MEQ tablet Take 1 tablet (20 mEq total) by mouth daily. ?Patient not taking: Reported on 03/09/2022 02/22/19   Fredia Sorrow, MD  ? ? ?PRN MEDs: sodium chloride, acetaminophen **OR** acetaminophen, bisacodyl, hydrALAZINE, HYDROmorphone (DILAUDID) injection, ipratropium, levalbuterol, LORazepam **OR** LORazepam, metoCLOPramide, ondansetron **OR** ondansetron (ZOFRAN) IV, oxyCODONE, senna-docusate, sodium chloride flush, zolpidem ? ?Past Medical History:  ?Diagnosis Date  ? AKI (acute kidney injury) ([place]) 04/04/2016  ? Anemia   ? Arthritis   ? Asthma   ? ETOH abuse   ? Folate deficiency 04/05/2016  ? Heme positive stool 04/05/2016  ? Hypertension   ? ? ?Past Surgical History:  ?Procedure Laterality Date  ? COLONOSCOPY    ? COLONOSCOPY WITH PROPOFOL N/A 03/02/2017  ? Procedure: COLONOSCOPY WITH PROPOFOL;  Surgeon: Lollie Sails, MD;  Location: [facility] ENDOSCOPY;  Service: Endoscopy;  Laterality: N/A;  ? COLONOSCOPY WITH PROPOFOL N/A 07/08/2020  ? Procedure: COLONOSCOPY WITH PROPOFOL;  Surgeon: Lesly Rubenstein, MD;  Location: [facility] ENDOSCOPY;  Service: Endoscopy;  Laterality: N/A;  ? ESOPHAGOGASTRODUODENOSCOPY (EGD) WITH PROPOFOL N/A 07/08/2020  ? Procedure: ESOPHAGOGASTRODUODENOSCOPY (EGD) WITH PROPOFOL;  Surgeon: Lesly Rubenstein, MD;  Location: [facility] ENDOSCOPY;  Service: Endoscopy;  Laterality: N/A;  ? FRACTURE SURGERY    ? KNEE ARTHROCENTESIS    ? ? ? reports that he has been smoking cigarettes. He has been smoking an average of .5 packs per day. He has never used smokeless tobacco. He reports current alcohol use of about 4.0 standard drinks per week. He reports that he does not use drugs. ? ? ?Family History  ?Problem Relation  Age of Onset  ? Hypertension Mother   ? Stroke Mother   ? Hypertension Father   ? ? ?Physical Exam:  ? ?Vitals:  ? 03/09/22 1330 03/09/22 1400 03/09/22 1430 03/09/22 1500  ?BP: 113/76 106/74 114/81 134/78  ?Pulse: 94 88 93 97  ?Resp: '18 18 16 20  '$ ?Temp:    97.9 ?F (36.6 ?C)  ?TempSrc:    Oral  ?SpO2: 97% 99% 100% 100%  ?Weight:      ?Height:      ? ?Constitutional: NAD, calm, comfortable ?Eyes: PERRL, lids and conjunctivae normal ?ENMT: Mucous membranes are moist. Posterior pharynx clear of any exudate or lesions.Normal dentition.  ?Neck: normal, supple, no masses, no thyromegaly ?Respiratory: clear to auscultation bilaterally, no wheezing, no crackles. Normal respiratory effort. No accessory muscle use.  ?Cardiovascular: Regular rate and rhythm, no murmurs / rubs / gallops. No extremity edema. 2+ pedal pulses. No  carotid bruits.  ?Abdomen: no tenderness, no masses palpated. No hepatosplenomegaly. Bowel sounds positive.  ?Musculoskeletal: no clubbing / cyanosis. No joint deformity upper and lower extremities. Good ROM, no contractures. Normal muscle tone.  ?Neurologic: CN II-XII grossly intact. Sensation intact, DTR normal. Strength 5/5 in all 4.  ?Psychiatric: Normal judgment and insight. Alert and oriented x 3. Normal mood.  ?Skin: no rashes, lesions, ulcers. No induration ?Wounds: per nursing documentation ?  ? ? ? ? ? ? ?Labs on admission:  ?  ?I have personally reviewed following labs and imaging studies ? ?CBC: ?Recent Labs  ?Lab 03/09/22 ?1219  ?WBC 17.8*  ?NEUTROABS 15.7*  ?HGB 7.4*  ?HCT 24.3*  ?MCV 91.0  ?PLT 270  ? ?Basic Metabolic Panel: ?Recent Labs  ?Lab 03/09/22 ?7588  ?NA 141  ?K 4.0  ?CL 110  ?CO2 21*  ?GLUCOSE 102*  ?BUN 10  ?CREATININE 0.97  ?CALCIUM 8.5*  ? ?GFR: ?Estimated Creatinine Clearance: 76.4 mL/min (by C-G formula based on SCr of 0.97 mg/dL). ?Liver Function Tests: ?Recent Labs  ?Lab 03/09/22 ?3254  ?AST 40  ?ALT 56*  ?ALKPHOS 207*  ?BILITOT 2.5*  ?PROT 5.9*  ?ALBUMIN 3.0*  ? ?No results  for input(s): LIPASE, AMYLASE in the last 168 hours. ?No results for input(s): AMMONIA in the last 168 hours. ?Coagulation Profile: ?Recent Labs  ?Lab 03/09/22 ?9826  ?INR 1.1  ? ? ?Urine analysis: ?   ?Com

## 2022-03-10 ENCOUNTER — Encounter (HOSPITAL_COMMUNITY)
Admission: EM | Disposition: A | Discharge status: Home / Self Care | Payer: Self-pay | Source: Home / Self Care | Attending: Internal Medicine

## 2022-03-10 ENCOUNTER — Inpatient Hospital Stay (HOSPITAL_COMMUNITY): Payer: Medicaid Other | Admitting: Anesthesiology

## 2022-03-10 ENCOUNTER — Inpatient Hospital Stay (HOSPITAL_COMMUNITY): Payer: Medicaid Other

## 2022-03-10 ENCOUNTER — Encounter (HOSPITAL_COMMUNITY): Payer: Self-pay | Admitting: Family Medicine

## 2022-03-10 DIAGNOSIS — F1010 Alcohol abuse, uncomplicated: Secondary | ICD-10-CM | POA: Diagnosis not present

## 2022-03-10 DIAGNOSIS — E876 Hypokalemia: Secondary | ICD-10-CM | POA: Diagnosis not present

## 2022-03-10 DIAGNOSIS — D509 Iron deficiency anemia, unspecified: Secondary | ICD-10-CM

## 2022-03-10 DIAGNOSIS — K264 Chronic or unspecified duodenal ulcer with hemorrhage: Secondary | ICD-10-CM

## 2022-03-10 DIAGNOSIS — K922 Gastrointestinal hemorrhage, unspecified: Secondary | ICD-10-CM | POA: Diagnosis not present

## 2022-03-10 DIAGNOSIS — K3189 Other diseases of stomach and duodenum: Secondary | ICD-10-CM

## 2022-03-10 DIAGNOSIS — K838 Other specified diseases of biliary tract: Secondary | ICD-10-CM

## 2022-03-10 DIAGNOSIS — K921 Melena: Secondary | ICD-10-CM

## 2022-03-10 DIAGNOSIS — K449 Diaphragmatic hernia without obstruction or gangrene: Secondary | ICD-10-CM

## 2022-03-10 DIAGNOSIS — K7031 Alcoholic cirrhosis of liver with ascites: Secondary | ICD-10-CM | POA: Diagnosis not present

## 2022-03-10 HISTORY — PX: ESOPHAGOGASTRODUODENOSCOPY (EGD) WITH PROPOFOL: SHX5813

## 2022-03-10 HISTORY — PX: BIOPSY: SHX5522

## 2022-03-10 LAB — CBC
HCT: 21.2 % — ABNORMAL LOW (ref 39.0–52.0)
HCT: 25.4 % — ABNORMAL LOW (ref 39.0–52.0)
Hemoglobin: 6.8 g/dL — CL (ref 13.0–17.0)
Hemoglobin: 8.4 g/dL — ABNORMAL LOW (ref 13.0–17.0)
MCH: 28.8 pg (ref 26.0–34.0)
MCH: 28.5 pg (ref 26.0–34.0)
MCHC: 32.1 g/dL (ref 30.0–36.0)
MCHC: 33.1 g/dL (ref 30.0–36.0)
MCV: 89.8 fL (ref 80.0–100.0)
MCV: 86.1 fL (ref 80.0–100.0)
Platelets: 241 10*3/uL (ref 150–400)
Platelets: 251 10*3/uL (ref 150–400)
RBC: 2.36 MIL/uL — ABNORMAL LOW (ref 4.22–5.81)
RBC: 2.95 MIL/uL — ABNORMAL LOW (ref 4.22–5.81)
RDW: 17.8 % — ABNORMAL HIGH (ref 11.5–15.5)
RDW: 16.9 % — ABNORMAL HIGH (ref 11.5–15.5)
WBC: 8.1 10*3/uL (ref 4.0–10.5)
WBC: 6.8 10*3/uL (ref 4.0–10.5)
nRBC: 0.2 % (ref 0.0–0.2)
nRBC: 0 % (ref 0.0–0.2)

## 2022-03-10 LAB — COMPREHENSIVE METABOLIC PANEL
ALT: 39 U/L (ref 0–44)
AST: 22 U/L (ref 15–41)
Albumin: 2.7 g/dL — ABNORMAL LOW (ref 3.5–5.0)
Alkaline Phosphatase: 134 U/L — ABNORMAL HIGH (ref 38–126)
Anion gap: 7 (ref 5–15)
BUN: 24 mg/dL — ABNORMAL HIGH (ref 6–20)
CO2: 23 mmol/L (ref 22–32)
Calcium: 8.1 mg/dL — ABNORMAL LOW (ref 8.9–10.3)
Chloride: 109 mmol/L (ref 98–111)
Creatinine, Ser: 1.01 mg/dL (ref 0.61–1.24)
GFR, Estimated: >60 mL/min (ref >60)
Glucose, Bld: 87 mg/dL (ref 70–99)
Potassium: 3.1 mmol/L — ABNORMAL LOW (ref 3.5–5.1)
Sodium: 139 mmol/L (ref 135–145)
Total Bilirubin: 2.9 mg/dL — ABNORMAL HIGH (ref 0.3–1.2)
Total Protein: 5.3 g/dL — ABNORMAL LOW (ref 6.5–8.1)

## 2022-03-10 LAB — CANCER ANTIGEN 19-9: CA 19-9: 81 U/mL — ABNORMAL HIGH (ref 0–35)

## 2022-03-10 LAB — HIV ANTIBODY (ROUTINE TESTING W REFLEX): HIV Screen 4th Generation wRfx: NONREACTIVE

## 2022-03-10 LAB — GLUCOSE, CAPILLARY: Glucose-Capillary: 88 mg/dL (ref 70–99)

## 2022-03-10 LAB — PROTIME-INR
INR: 1.1 (ref 0.8–1.2)
Prothrombin Time: 14.2 seconds (ref 11.4–15.2)

## 2022-03-10 LAB — MRSA NEXT GEN BY PCR, NASAL: MRSA by PCR Next Gen: NOT DETECTED

## 2022-03-10 LAB — CEA: CEA: 3 ng/mL (ref 0.0–4.7)

## 2022-03-10 LAB — PREPARE RBC (CROSSMATCH)

## 2022-03-10 SURGERY — ESOPHAGOGASTRODUODENOSCOPY (EGD) WITH PROPOFOL
Anesthesia: General

## 2022-03-10 MED ORDER — SODIUM CHLORIDE 0.9 % IV SOLN
INTRAVENOUS | Status: DC
Start: 2022-03-10 — End: 2022-03-10

## 2022-03-10 MED ORDER — POTASSIUM CHLORIDE CRYS ER 20 MEQ PO TBCR
40.0000 meq | EXTENDED_RELEASE_TABLET | Freq: Once | ORAL | Status: AC
Start: 2022-03-10 — End: 2022-03-10
  Administered 2022-03-10: 40 meq via ORAL
  Filled 2022-03-10: qty 2

## 2022-03-10 MED ORDER — LACTATED RINGERS IV SOLN: INTRAVENOUS | Status: DC | PRN

## 2022-03-10 MED ORDER — POTASSIUM CHLORIDE IN NACL 20-0.9 MEQ/L-% IV SOLN: INTRAVENOUS | Status: DC

## 2022-03-10 MED ORDER — DEXTROSE-NACL 5-0.45 % IV SOLN: INTRAVENOUS | Status: DC

## 2022-03-10 MED ORDER — SODIUM CHLORIDE 0.9% IV SOLUTION: Freq: Once | INTRAVENOUS | Status: AC

## 2022-03-10 MED ORDER — PANTOPRAZOLE INFUSION (NEW) - SIMPLE MED
8.0000 mg/h | INTRAVENOUS | Status: DC
Start: 2022-03-10 — End: 2022-03-13
  Administered 2022-03-10 – 2022-03-12 (×6): 8 mg/h via INTRAVENOUS
  Filled 2022-03-10: qty 100
  Filled 2022-03-10 (×2): qty 80
  Filled 2022-03-10: qty 100
  Filled 2022-03-10: qty 80
  Filled 2022-03-10: qty 100
  Filled 2022-03-10: qty 80
  Filled 2022-03-10: qty 100

## 2022-03-10 MED ORDER — LIDOCAINE HCL (CARDIAC) PF 50 MG/5ML IV SOSY
PREFILLED_SYRINGE | INTRAVENOUS | Status: DC | PRN
Start: 2022-03-10 — End: 2022-03-10
  Administered 2022-03-10: 100 mg via INTRAVENOUS

## 2022-03-10 MED ORDER — PROPOFOL 10 MG/ML IV BOLUS
INTRAVENOUS | Status: DC | PRN
  Administered 2022-03-10: 100 mg via INTRAVENOUS
  Administered 2022-03-10: 50 mg via INTRAVENOUS
  Administered 2022-03-10: 20 mg via INTRAVENOUS
  Administered 2022-03-10 (×3): 50 mg via INTRAVENOUS

## 2022-03-10 MED ORDER — LACTATED RINGERS IV SOLN: INTRAVENOUS | Status: DC

## 2022-03-10 MED ORDER — GADOBUTROL 1 MMOL/ML IV SOLN
6.0000 mL | Freq: Once | INTRAVENOUS | Status: AC | PRN
  Administered 2022-03-10: 6 mL via INTRAVENOUS

## 2022-03-10 NOTE — Brief Op Note (Signed)
03/09/2022 - 03/10/2022 ? ?12:22 PM ? ?PATIENT:  Wesley Francis  56 y.o. male ? ?PRE-OPERATIVE DIAGNOSIS:  anemia, rectal bleeding, abnormal CT duodenum and pancreatic head ? ?POST-OPERATIVE DIAGNOSIS:  38-44 cm hiatal hernia; duodenitis; big ulcer in duodenum; inflammation in pyloris; pyloric channel biopsies; gastric biopsies; ampullary duodenal ulcer biopsies;  ? ?PROCEDURE:  Procedure(s): ?ESOPHAGOGASTRODUODENOSCOPY (EGD) WITH PROPOFOL (N/A) ?BIOPSY ? ?Patient underwent EGD under propofol sedation.  Tolerated the procedure adequately. A 6 cm hiatal hernia was present. Diffuse severely congested mucosa was found at the pylorus.  Biopsies were taken from body and antrum with a cold forceps for Helicobacter pylori testing.  Biopsies from the pylorus were taken with a cold forceps for histology and put in a separate jar. One non bleeding deeply cratered duodenal ulcer was found at the area where the ampulla should be located. The lesion was at least 30 mm in largest dimension and was circumferential.  This area was deep cavity of close to 2 cm in depth.  There was presence of some food that was removed with forceps for visualization of the cavity. A rounded cirumferential mass was present at the base of the ulcer - the mass was partially covered with fibrin. I gently poked the mass with the forceps and it felt soft - the mass is likely a large adherent clot. I did not attempt to remove this clot due to the size of the ulcer. Multiple biopsies from the edge of the ulcer were taken with a cold forceps for histology. The exam of the duodenum was otherwise normal.  ? ?RECOMMENDATIONS ?- Return patient to hospital ward for ongoing care.  ?- Clear liquid diet.  ?- Await pathology results - sent for STAT review.  ?- Follow up CEA and CA 19-9  ?- Pantoprazole drip. ?- Sucralfate 1 g TID ?- Check H/H daily. ?- CT angio STAT if large bleeding of hemodynamic instability. ?- Will discuss with radiology if MRCP may provide further  information. ?- Most likely will require EUS in the short term. ? ?Maylon Peppers, MD ?Gastroenterology and Hepatology ?[facility] ? ?

## 2022-03-10 NOTE — Anesthesia Postprocedure Evaluation (Signed)
Anesthesia Post Note ? ?Patient: LISLE SKILLMAN ? ?Procedure(s) Performed: ESOPHAGOGASTRODUODENOSCOPY (EGD) WITH PROPOFOL ?BIOPSY ? ?Patient location during evaluation: PACU ?Anesthesia Type: General ?Level of consciousness: awake and alert and oriented ?Pain management: pain level controlled ?Vital Signs Assessment: post-procedure vital signs reviewed and stable ?Respiratory status: spontaneous breathing, nonlabored ventilation and respiratory function stable ?Cardiovascular status: blood pressure returned to baseline and stable ?Postop Assessment: no apparent nausea or vomiting ?Anesthetic complications: no ? ? ?No notable events documented. ? ? ?Last Vitals:  ?Vitals:  ? 03/10/22 1207 03/10/22 1215  ?BP:  112/77  ?Pulse:    ?Resp: (!) 32 (!) 24  ?Temp:    ?SpO2: 92%   ?  ?Last Pain:  ?Vitals:  ? 03/10/22 1215  ?TempSrc:   ?PainSc: 0-No pain  ? ? ?  ?  ?  ?  ?  ?  ? ?Ladene Allocca C Cameo Schmiesing ? ? ? ? ?

## 2022-03-10 NOTE — TOC Progression Note (Signed)
Transition of Care (TOC) - Progression Note  ? ? ?Patient Details  ?Name: Wesley Francis ?MRN: [id number] ?Date of Birth: 09/23/1966 ? ?Transition of Care (TOC) CM/SW Contact  ?Salome Arnt, LCSW ?Phone Number: ?03/10/2022, 8:47 AM ? ?Clinical Narrative:  TOC received consult for substance abuse counseling, home health, and DME. Pt reports he has cut back on his own to drinking only on weekends. He does not feel this is a problem for him and declines resources. Discussed possible home needs and pt defers to sister, Wesley Francis. Wesley Francis reports she is a CNA and at this point do not feel pt will need any home health services or DME. PT pending. TOC will continue to follow and assist with d/c planning.   ? ? ? ?  ?  ? ?Expected Discharge Plan and Services ?  ?  ?  ?  ?  ?                ?  ?  ?  ?  ?  ?  ?  ?  ?  ?  ? ? ?Social Determinants of Health (SDOH) Interventions ?  ? ?Readmission Risk Interventions ?   ? View : No data to display.  ?  ?  ?  ? ? ?

## 2022-03-10 NOTE — Op Note (Signed)
[facility] ?Patient Name: Wesley Francis ?Procedure Date: 03/10/2022 11:03 AM ?MRN: [id number] ?Date of Birth: 04/14/1966 ?Attending MD: Maylon Peppers ,  ?CSN: [id number] ?Age: 56 ?Admit Type: Inpatient ?Procedure:                Upper GI endoscopy ?Indications:              Iron deficiency anemia, Hematochezia, Melena,  ?                          Abnormal CT of the GI tract - pneumobilia ?Providers:                Maylon Peppers, [place]field Page, [place] Cyndi Bender  ?                          Tech, Technician ?Referring MD:              ?Medicines:                Monitored Anesthesia Care ?Complications:            No immediate complications. ?Estimated Blood Loss:     Estimated blood loss: none. ?Procedure:                Pre-Anesthesia Assessment: ?                          - Prior to the procedure, a History and Physical  ?                          was performed, and patient medications, allergies  ?                          and sensitivities were reviewed. The patient's  ?                          tolerance of previous anesthesia was reviewed. ?                          - The risks and benefits of the procedure and the  ?                          sedation options and risks were discussed with the  ?                          patient. All questions were answered and informed  ?                          consent was obtained. ?                          - ASA Grade Assessment: III - A patient with severe  ?                          systemic disease. ?                          - Adequate visualization was aided with  the use of  ?                          a transparent cap attached to the distal part of  ?                          the endoscope. ?                          After obtaining informed consent, the endoscope was  ?                          passed under direct vision. Throughout the  ?                          procedure, the patient's blood pressure, pulse, and  ?                          oxygen  saturations were monitored continuously. The  ?                          GIF-H190 ([id number]) scope was introduced through the  ?                          mouth, and advanced to the third part of duodenum.  ?                          The upper GI endoscopy was accomplished without  ?                          difficulty. The patient tolerated the procedure  ?                          well. ?Scope In: 11:33:19 AM ?Scope Out: 11:57:28 AM ?Total Procedure Duration: 0 hours 24 minutes 9 seconds  ?Findings: ?     A 6 cm hiatal hernia was present. ?     Diffuse severely congested mucosa was found at the pylorus. Biopsies  ?     were taken from body and antrum with a cold forceps for Helicobacter  ?     pylori testing. Biopsies from the pylorus were taken with a cold forceps  ?     for histology and put in a separate jar . ?     One non bleeding deeply cratered duodenal ulcer was found at the area  ?     where the ampulla should be located. The lesion was at least 30 mm in  ?     largest dimension and was circumferential. This area was deep cavity of  ?     close to 2 cm in depth. There was presence of some food that was removed  ?     with forceps for visualization of the cavity. A rounded cirumferential  ?     mass was present at the base of the ulcer - the mass was partially  ?     covered with fibrin. I gently poked the mass with the forceps and it  ?  felt soft - the mass is likely a large adherent clot. I did not attempt  ?     to remove this clot due to the size of the ulcer. Multiple biopsies from  ?     the edge of the ulcer were taken with a cold forceps for histology. ?     The exam of the duodenum was otherwise normal. ?     Endoscopic findings concerning for possible invasive malignancy eroding  ?     the duodenal wall - ampullary vs pancreatic malignancy. ?     I discussed the findings with Dr. Constance Haw (General Surgery). ?Impression:               - 6 cm hiatal hernia. ?                          - Congestive  gastropathy. Biopsied. ?                          - Non-bleeding duodenal ulcer at the ampullar area  ?                          with an adherent clot (Forrest Class IIb).  ?                          Biopsied. - Ulcer concerning for possible invasive  ?                          malignancy eroding the duodenal wall - ampullary vs  ?                          pancreatic malignancy. ?Moderate Sedation: ?     Per Anesthesia Care ?Recommendation:           - Return patient to hospital ward for ongoing care. ?                          - Clear liquid diet. ?                          - Await pathology results - sent for STAT review. ?                          - Follow up CEA and CA 19-9 ?                          - Pantoprazole drip. ?                          - Sucralfate 1 g TID ?                          - Check H/H daily. ?                          - CT angio STAT if large bleeding of hemodynamic  ?  instability. ?                          - Will discuss with radiology if MRCP may provide  ?                          further information. ?                          - Most likely will require EUS in the short term. ?Procedure Code(s):        --- Professional --- ?                          [phone], Esophagogastroduodenoscopy, flexible,  ?                          transoral; with biopsy, single or multiple ?Diagnosis Code(s):        --- Professional --- ?                          K44.9, Diaphragmatic hernia without obstruction or  ?                          gangrene ?                          K31.89, Other diseases of stomach and duodenum ?                          K26.4, Chronic or unspecified duodenal ulcer with  ?                          hemorrhage ?                          D50.9, Iron deficiency anemia, unspecified ?                          K92.1, Melena (includes Hematochezia) ?                          R93.3, Abnormal findings on diagnostic imaging of  ?                          other parts of  digestive tract ?CPT copyright 2019 [employer]. All rights reserved. ?The codes documented in this report are preliminary and upon coder review may  ?be revised to meet current compliance requirements. ?Maylon Peppers, MD ?Maylon Peppers,  ?03/10/2022 12:24:13 PM ?This report has been signed electronically. ?Number of Addenda: 0 ?

## 2022-03-10 NOTE — Transfer of Care (Signed)
Immediate Anesthesia Transfer of Care Note ? ?Patient: Wesley Francis ? ?Procedure(s) Performed: ESOPHAGOGASTRODUODENOSCOPY (EGD) WITH PROPOFOL ?BIOPSY ? ?Patient Location: PACU ? ?Anesthesia Type:General ? ?Level of Consciousness: sedated ? ?Airway & Oxygen Therapy: Patient Spontanous Breathing and Patient connected to nasal cannula oxygen ? ?Post-op Assessment: Report given to RN and Post -op Vital signs reviewed and stable ? ?Post vital signs: Reviewed and stable ? ?Last Vitals:  ?Vitals Value Taken Time  ?BP 95/56 03/10/22 1204  ?Temp 36.4 ?C 03/10/22 1204  ?Pulse 85 03/10/22 1205  ?Resp 32 03/10/22 1207  ?SpO2 89 % 03/10/22 1205  ?Vitals shown include unvalidated device data. ? ?Last Pain:  ?Vitals:  ? 03/10/22 1127  ?TempSrc:   ?PainSc: 0-No pain  ?   ? ?Patients Stated Pain Goal: 5 (03/10/22 1035) ? ?Complications: No notable events documented. ?

## 2022-03-10 NOTE — Anesthesia Procedure Notes (Addendum)
Date/Time: 03/10/2022 11:26 AM ?Performed by: Vista Deck, CRNA ?Pre-anesthesia Checklist: Patient identified, Emergency Drugs available, Suction available, Timeout performed and Patient being monitored ?Patient Re-evaluated:Patient Re-evaluated prior to induction ?Oxygen Delivery Method: Nasal Cannula ? ? ? ? ?

## 2022-03-10 NOTE — Anesthesia Procedure Notes (Signed)
Date/Time: 03/10/2022 11:34 AM ?Performed by: Vista Deck, CRNA ?Pre-anesthesia Checklist: Patient identified, Emergency Drugs available, Suction available, Timeout performed and Patient being monitored ?Patient Re-evaluated:Patient Re-evaluated prior to induction ?Oxygen Delivery Method: Non-rebreather mask ? ? ? ? ?

## 2022-03-10 NOTE — Evaluation (Signed)
Physical Therapy Evaluation ?Patient Details ?Name: Wesley Francis ?MRN: [id number] ?DOB: 05-24-1966 ?Today's Date: 03/10/2022 ? ?History of Present Illness ? Wesley Francis is a 56 year old male with extensive history of HTN, chronic alcohol abuse, liver cirrhosis, arthritis, asthma, iron deficiency, chronic tobacco abuse, seasonal allergies presenting with acute rectal bleed. ? ?  ?Clinical Impression ? Patient limited for functional mobility as stated below secondary to BLE weakness, fatigue and impaired standing balance. Patient does not require assist with bed mobility and demonstrates good sitting tolerance and balance at EOB. He transfers to standing without AD but is unsteady upon standing. He initially ambulates to chair at bedside but then requires use of commode where he ambulates with use of IV pole for support. Patient ambulates with slow, labored cadence but without loss of balance. Patient ends session seated in chair at bedside. Patient will benefit from continued physical therapy in hospital and recommended venue below to increase strength, balance, endurance for safe ADLs and gait. ?   ?   ? ?Recommendations for follow up therapy are one component of a multi-disciplinary discharge planning process, led by the attending physician.  Recommendations may be updated based on patient status, additional functional criteria and insurance authorization. ? ?Follow Up Recommendations Home health PT ? ?  ?Assistance Recommended at Discharge Intermittent Supervision/Assistance  ?Patient can return home with the following ? A little help with walking and/or transfers;A little help with bathing/dressing/bathroom;Assistance with cooking/housework;Assist for transportation;Help with stairs or ramp for entrance ? ?  ?Equipment Recommendations None recommended by PT  ?Recommendations for Other Services ?    ?  ?Functional Status Assessment Patient has had a recent decline in their functional status and demonstrates  the ability to make significant improvements in function in a reasonable and predictable amount of time.  ? ?  ?Precautions / Restrictions Precautions ?Precautions: Fall ?Restrictions ?Weight Bearing Restrictions: No  ? ?  ? ?Mobility ? Bed Mobility ?Overal bed mobility: Modified Independent ?  ?  ?  ?  ?  ?  ?  ?  ? ?Transfers ?Overall transfer level: Needs assistance ?Equipment used: None ?Transfers: Sit to/from Stand ?Sit to Stand: Min guard ?  ?  ?  ?  ?  ?  ?  ? ?Ambulation/Gait ?Ambulation/Gait assistance: Min guard ?Gait Distance (Feet): 20 Feet ?Assistive device: IV Pole ?Gait Pattern/deviations: Decreased stride length ?Gait velocity: decreased ?  ?  ?General Gait Details: slow cadence with use of IV pole for support, slightly unsteady ? ?Stairs ?  ?  ?  ?  ?  ? ?Wheelchair Mobility ?  ? ?Modified Rankin (Stroke Patients Only) ?  ? ?  ? ?Balance Overall balance assessment: Needs assistance ?Sitting-balance support: No upper extremity supported, Feet supported ?Sitting balance-Leahy Scale: Good ?Sitting balance - Comments: seated EOB ?  ?Standing balance support: No upper extremity supported ?Standing balance-Leahy Scale: Fair ?Standing balance comment: without AD ?  ?  ?  ?  ?  ?  ?  ?  ?  ?  ?  ?   ? ? ? ?Pertinent Vitals/Pain Pain Assessment ?Pain Assessment: No/denies pain  ? ? ?Home Living Family/patient expects to be discharged to:: Private residence ?Living Arrangements: Alone ?Available Help at Discharge: Family;Available 24 hours/day ?Type of Home: House ?Home Access: Level entry ?  ?  ?  ?Home Layout: One level ?Home Equipment: Rolling Walker (2 wheels);Grab bars - tub/shower;Grab bars - toilet ?   ?  ?Prior Function Prior Level of Function : Needs  assist ?  ?  ?  ?  ?  ?  ?Mobility Comments: household ambulator without AD ?ADLs Comments: sister assists ?  ? ? ?Hand Dominance  ?   ? ?  ?Extremity/Trunk Assessment  ? Upper Extremity Assessment ?Upper Extremity Assessment: Defer to OT evaluation ?   ? ?Lower Extremity Assessment ?Lower Extremity Assessment: Generalized weakness ?  ? ?Cervical / Trunk Assessment ?Cervical / Trunk Assessment: Normal  ?Communication  ? Communication: No difficulties  ?Cognition Arousal/Alertness: Awake/alert ?Behavior During Therapy: [facility] for tasks assessed/performed ?Overall Cognitive Status: Within Functional Limits for tasks assessed ?  ?  ?  ?  ?  ?  ?  ?  ?  ?  ?  ?  ?  ?  ?  ?  ?  ?  ?  ? ?  ?General Comments   ? ?  ?Exercises    ? ?Assessment/Plan  ?  ?PT Assessment Patient needs continued PT services  ?PT Problem List Decreased strength;Decreased mobility;Decreased activity tolerance;Decreased balance ? ?   ?  ?PT Treatment Interventions DME instruction;Therapeutic exercise;Gait training;Balance training;Stair training;Neuromuscular re-education;Functional mobility training;Therapeutic activities;Patient/family education   ? ?PT Goals (Current goals can be found in the Care Plan section)  ?Acute Rehab PT Goals ?Patient Stated Goal: return home ?PT Goal Formulation: With patient ?Time For Goal Achievement: 03/24/22 ?Potential to Achieve Goals: Good ? ?  ?Frequency Min 3X/week ?  ? ? ?Co-evaluation   ?  ?  ?  ?  ? ? ?  ?AM-PAC PT "6 Clicks" Mobility  ?Outcome Measure Help needed turning from your back to your side while in a flat bed without using bedrails?: None ?Help needed moving from lying on your back to sitting on the side of a flat bed without using bedrails?: None ?Help needed moving to and from a bed to a chair (including a wheelchair)?: A Little ?Help needed standing up from a chair using your arms (e.g., wheelchair or bedside chair)?: A Little ?Help needed to walk in hospital room?: A Little ?Help needed climbing 3-5 steps with a railing? : A Lot ?6 Click Score: 19 ? ?  ?End of Session   ?Activity Tolerance: Patient tolerated treatment well;Patient limited by fatigue ?Patient left: in chair;with call bell/phone within reach ?Nurse Communication: Mobility  status ?PT Visit Diagnosis: Unsteadiness on feet (R26.81);Muscle weakness (generalized) (M62.81);Other abnormalities of gait and mobility (R26.89) ?  ? ?Time: 8588-5027 ?PT Time Calculation (min) (ACUTE ONLY): 24 min ? ? ?Charges:   PT Evaluation ?$PT Eval Low Complexity: 1 Low ?PT Treatments ?$Therapeutic Activity: 8-22 mins ?  ?   ? ? ?10:53 AM, 03/10/22 ?Mearl Latin PT, DPT ?Physical Therapist at [facility] ?[facility] ? ? ?

## 2022-03-10 NOTE — Progress Notes (Addendum)
Hgb improved to 8.4 today from 6.8 yesterday s/p 2 units PRBCs. Reports 1-2 episodes of low volume rectal bleeding overnight/early this morning. No melena, abdominal pain, N/V. Tolerated clear liquid diet well yesterday. NPO this morning. LFTs are improving, T bilirubin only slightly worse today at 2.9. Case was discussed with Dr. Laural Golden, no plans for ERCP today. May need EUS at some point. Will plan for EGD only today with Dr. Jenetta Downer. The risks, benefits, and alternatives have been discussed with the patient and his sister in detail. The patient states understanding and desires to proceed. May need to consider colonoscopy at some point.  ? ?Aliene Altes, PA-C ?[facility] ? ? ? ?

## 2022-03-10 NOTE — Progress Notes (Signed)
We will proceed with EGD as scheduled.  I thoroughly discussed with the patient his procedure, including the risks involved. Patient understands what the procedure involves including the benefits and any risks. Patient understands alternatives to the proposed procedure. Risks including (but not limited to) bleeding, tearing of the lining (perforation), rupture of adjacent organs, problems with heart and lung function, infection, and medication reactions. A small percentage of complications may require surgery, hospitalization, repeat endoscopic procedure, and/or transfusion.  Patient understood and agreed.  Avari Gelles Castaneda, MD Gastroenterology and Hepatology [facility]  

## 2022-03-10 NOTE — Plan of Care (Signed)
?  Problem: Acute Rehab PT Goals(only PT should resolve) ?Goal: Patient Will Transfer Sit To/From Stand ?Outcome: Progressing ?Flowsheets (Taken 03/10/2022 1055) ?Patient will transfer sit to/from stand: ? with supervision ? with modified independence ?Goal: Pt Will Ambulate ?Outcome: Progressing ?Flowsheets (Taken 03/10/2022 1055) ?Pt will Ambulate: ? 50 feet ? with modified independence ? with least restrictive assistive device ? with supervision ?Goal: Pt/caregiver will Perform Home Exercise Program ?Outcome: Progressing ?Flowsheets (Taken 03/10/2022 1055) ?Pt/caregiver will Perform Home Exercise Program: ? For increased strengthening ? For improved balance ? Independently ?  ?10:56 AM, 03/10/22 ?Mearl Latin PT, DPT ?Physical Therapist at [facility] ?[facility] ? ?

## 2022-03-10 NOTE — Anesthesia Preprocedure Evaluation (Signed)
Anesthesia Evaluation  ?Patient identified by MRN, date of birth, ID band ?Patient awake ? ? ? ?Reviewed: ?Allergy & Precautions, NPO status , Patient's Chart, lab work & pertinent test results, reviewed documented beta blocker date and time  ? ?Airway ?Mallampati: II ? ?TM Distance: >3 FB ?Neck ROM: Full ? ? ? Dental ? ?(+) Edentulous Upper, Edentulous Lower ?  ?Pulmonary ?asthma , Current Smoker and Patient abstained from smoking.,  ?  ?Pulmonary exam normal ?breath sounds clear to auscultation ? ? ? ? ? ? Cardiovascular ?hypertension, Pt. on medications and Pt. on home beta blockers ?Normal cardiovascular exam ?Rhythm:Regular Rate:Normal ? ? ?  ?Neuro/Psych ?negative neurological ROS ? negative psych ROS  ? GI/Hepatic ?GERD  Medicated and Controlled,(+)  ?  ? substance abuse ? alcohol use,   ?Endo/Other  ?negative endocrine ROS ? Renal/GU ?Renal disease (AKI)  ?negative genitourinary ?  ?Musculoskeletal ? ?(+) Arthritis , Osteoarthritis,   ? Abdominal ?  ?Peds ?negative pediatric ROS ?(+)  Hematology ? ?(+) Blood dyscrasia, anemia ,   ?Anesthesia Other Findings ? ? Reproductive/Obstetrics ?negative OB ROS ? ?  ? ? ? ? ? ? ? ? ? ? ? ? ? ?  ?  ? ? ? ? ? ? ? ?Anesthesia Physical ?Anesthesia Plan ? ?ASA: 3 and emergent ? ?Anesthesia Plan: General  ? ?Post-op Pain Management: Minimal or no pain anticipated  ? ?Induction: Intravenous ? ?PONV Risk Score and Plan: TIVA ? ?Airway Management Planned: Nasal Cannula and Natural Airway ? ?Additional Equipment:  ? ?Intra-op Plan:  ? ?Post-operative Plan:  ? ?Informed Consent: I have reviewed the patients History and Physical, chart, labs and discussed the procedure including the risks, benefits and alternatives for the proposed anesthesia with the patient or authorized representative who has indicated his/her understanding and acceptance.  ? ? ? ?Dental advisory given ? ?Plan Discussed with: CRNA and Surgeon ? ?Anesthesia Plan Comments:    ? ? ? ? ? ?Anesthesia Quick Evaluation ? ?

## 2022-03-10 NOTE — Progress Notes (Signed)
[facility] Progress Note ? ?Day of Surgery  ?Subjective: ?Saw patient in preop area and in  room after. Dr. Eula Listen called me to show me the images over video of the patient. Area that looks like ampulla with clot and likely bulging mass. Biopsy done in that area. ? ?Patient feels ok now.  ? ?Objective: ?Vital signs in last 24 hours: ?Temp:  [97.6 ?F (36.4 ?C)-99.3 ?F (37.4 ?C)] 97.6 ?F (36.4 ?C) (04/04 1204) ?Pulse Rate:  [69-97] 85 (04/04 1204) ?Resp:  [13-32] 24 (04/04 1215) ?BP: (95-151)/(56-90) 112/77 (04/04 1215) ?SpO2:  [89 %-100 %] 92 % (04/04 1207) ?Weight:  [60.9 kg-66.8 kg] 60.9 kg (04/04 0500) ?Last BM Date : 03/10/22 ? ?Intake/Output from previous day: ?04/03 0701 - 04/04 0700 ?In: 1281.1 [P.O.:200; I.V.:375; Blood:645; IV Piggyback:61] ?Out: 4500 [Urine:4500] ?Intake/Output this shift: ?Total I/O ?In: 700 [I.V.:700] ?Out: -  ? ?General appearance: alert and cooperative ?GI: soft, nondistended, minimally tender ? ?Lab Results:  ?Recent Labs  ?  03/09/22 ?2306 03/10/22 ?5102  ?WBC 8.1 6.8  ?HGB 6.8* 8.4*  ?HCT 21.2* 25.4*  ?PLT 241 251  ? ?BMET ?Recent Labs  ?  03/09/22 ?0955 03/10/22 ?0357  ?NA 141 139  ?K 4.0 3.1*  ?CL 110 109  ?CO2 21* 23  ?GLUCOSE 102* 87  ?BUN 10 24*  ?CREATININE 0.97 1.01  ?CALCIUM 8.5* 8.1*  ? ?PT/INR ?Recent Labs  ?  03/09/22 ?0955 03/10/22 ?0357  ?LABPROT 13.7 14.2  ?INR 1.1 1.1  ? ? ?Studies/Results: ?CT ABDOMEN WO CONTRAST ? ?Result Date: 03/09/2022 ?CLINICAL DATA:  Acute abdominal pain. Rectal bleeding. Questionable duodenal perforation. EXAM: CT ABDOMEN WITHOUT CONTRAST TECHNIQUE: Multidetector CT imaging of the abdomen was performed following the standard protocol without IV contrast. RADIATION DOSE REDUCTION: This exam was performed according to the departmental dose-optimization program which includes automated exposure control, adjustment of the mA and/or kV according to patient size and/or use of iterative reconstruction technique. COMPARISON:  CTA  abdomen and pelvis 03/09/2022. FINDINGS: Lower chest: There is atelectasis or scarring in the lung bases. Hepatobiliary: Again seen is air within the gallbladder, common bile duct and left intrahepatic ducts similar to the prior study. No gallstones are identified. Liver otherwise appears within normal limits. Pancreas: Enlargement of the pancreatic head with surrounding inflammation is similar to the prior study. There is air within the pancreatic head, also unchanged. Pancreatic body and tail are within normal limits. Spleen: Normal in size without focal abnormality. Adrenals/Urinary Tract: There is a 15 mm cyst in the superior pole the right kidney. Additional hypodensity in the left kidney is too small to characterize, but also likely a cyst. There is no hydronephrosis. Stomach/Bowel: Oral contrast is seen throughout the stomach and mid small bowel loops. Homogeneous density noted within the stomach lumen measuring 7.3 x 9.2 x 4.1 cm which may represent hematoma. Stomach is nondilated. There is no extravasation of oral contrast in the region of the duodenum. Duodenal wall thickening and inflammation versus there is no free air in this region. Visualized small bowel loops are otherwise within normal limits. Vascular/Lymphatic: No significant vascular findings are present. No enlarged abdominal or pelvic lymph nodes. Other: No ascites or free air.  No focal abdominal wall hernia. Musculoskeletal: No acute or significant osseous findings. IMPRESSION: 1. Again seen is inflammation and wall thickening of the duodenum with enlargement of the pancreatic head. Central air in the pancreatic head is unchanged. There is no extravasation of oral contrast or free air to indicate bowel  perforation. Oral contrast reaches mid small bowel. 2. Persistent pneumobilia of uncertain etiology. 3. Dense area in the stomach lumen may represent hematoma. Electronically Signed   By: Ronney Asters M.D.   On: 03/09/2022 16:53  ? ?CT Angio  Abd/Pel W and/or Wo Contrast ? ?Result Date: 03/09/2022 ?CLINICAL DATA:  Rectal bleeding, anemia and syncope. EXAM: CT ANGIOGRAPHY ABDOMEN AND PELVIS WITH CONTRAST AND WITHOUT CONTRAST TECHNIQUE: Multidetector CT imaging of the abdomen and pelvis was performed using the standard protocol during bolus administration of intravenous contrast. Multiplanar reconstructed images and MIPs were obtained and reviewed to evaluate the vascular anatomy. RADIATION DOSE REDUCTION: This exam was performed according to the departmental dose-optimization program which includes automated exposure control, adjustment of the mA and/or kV according to patient size and/or use of iterative reconstruction technique. CONTRAST:  176m OMNIPAQUE IOHEXOL 350 MG/ML SOLN COMPARISON:  No prior CT studies for comparison. FINDINGS: VASCULAR Aorta: Normal abdominal aorta without aneurysm, atherosclerosis or dissection. Celiac: Normally patent.  Normally patent branch vessels. SMA: Normally patent. The right hepatic artery is replaced off of the SMA. Renals: Normally patent bilateral single renal arteries. IMA: Normally patent. Inflow: Normally patent bilateral iliac arteries with minimal atherosclerosis of the distal left common iliac artery. Proximal Outflow: Normally patent bilateral common femoral arteries and femoral bifurcations. Veins: Venous phase imaging demonstrates normal patency of the IVC, hepatic veins, renal veins, iliac veins, common femoral veins, visualized mesenteric veins, splenic vein and portal vein. Review of the MIP images confirms the above findings. NON-VASCULAR Lower chest: No acute abnormality. Hepatobiliary: The liver is unremarkable. The gallbladder is relatively contracted. There is pneumobilia within the common bile duct as well as some left lobe intrahepatic ducts. Pancreas: Inflammatory process completely distorts normal morphology of the pancreatic head with some uncinate morphology remaining. The pancreatic body and  tail appear intact without evidence of mass, pancreatic ductal dilatation or pancreatitis. Spleen: Normal in size without focal abnormality. Adrenals/Urinary Tract: Normal adrenal glands bilaterally. Unremarkable appearance of kidneys with small bilateral renal cysts present. The bladder is unremarkable. Stomach/Bowel: There is a large region of amorphous soft tissue abnormality centered at the region of the pancreatic head and abutting the second and third portions of the duodenum over a region measuring up to approximately 6 cm in diameter. In this area there is a large amount of inflammation and edema with irregular air and fluid present medial to the duodenum and in the region of the pancreatic head as well as distortion of the ampulla, distal common bile duct and pneumobilia. Differential includes inflammation related to a ruptured duodenal ulcer, ruptured duodenal diverticulum or neoplasm of the duodenum or pancreas involving the ampulla and causing pneumobilia and local duodenal perforation. On arterial and venous phases of imaging, there is no active vascular contrast extravasation into the gastrointestinal tract. No arterial pseudoaneurysm identified. Lymphatic: No enlarged abdominal or pelvic lymph nodes. Reproductive: Prostate is unremarkable. Other: No abdominal wall hernia or abnormality. No abdominopelvic ascites. Musculoskeletal: No acute or significant osseous findings. IMPRESSION: 1. Significant inflammatory process centered in the region of the pancreatic head and involving the second and third portions of the duodenum. In this area, there is a large, roughly 6 cm area of edema and irregular extraluminal air with fluid in the region of the pancreatic head as well as associated pneumobilia in the common bile duct extending into the left lobe of the liver. Differential considerations include ruptured duodenal ulcer, ruptured duodenal diverticulum or neoplasm of the duodenum or pancreas involving the  ampulla and leading to regional perforation and pneumobilia. Recommend surgical and gastroenterology consultation and consideration of EGD for direct visualization. 2. No evidence of active contrast extr

## 2022-03-10 NOTE — Assessment & Plan Note (Signed)
-   Repleting with IV fluids and p.o. when tolerated ?

## 2022-03-10 NOTE — Progress Notes (Signed)
?PROGRESS NOTE ? ? ? ?Patient: Wesley Francis                            PCP: Jerel Shepherd, [place]                    ?DOB: 12-20-1965            DOA: 03/09/2022 ?HXT:[id number]             DOS: 03/10/2022, 1:30 PM ? ? LOS: 1 day  ? ?Date of Service: The patient was seen and examined on 03/10/2022 ? ?Subjective:  ? ?The patient was seen and examined this morning. ?Hemodynamically stable, awake alert oriented ?Laying comfortably in bed, denies any chest pain or abdominal pain ?Apparently had a minimal bleeding with bowel movement this morning ?Otherwise stable ? ? ?Brief Narrative:  ? ? ? ?Wesley Francis is a 56 year old male with extensive history of HTN, chronic alcohol abuse, liver cirrhosis, arthritis, asthma, iron deficiency, chronic tobacco abuse, seasonal allergies presenting with acute rectal bleed. ? ?Patient reports his initial bowel movement in the morning was nonbloody, subsequently noticed frank bright red blood per rectum, according to the patient was massive. ? ?He admits that he is drinking alcohol only on the weekend. But he has history of heavy drinking drinking for over 20 yrs..  Possible having liver cirrhosis now... Was referred to a liver specialist at [facility] - he was seen on 03/05/2022, his  lab was checked subsequently noted for hemoglobin of 6.0, he was sent to the ED there and was transfused with 2U PRBC. ? ?He reports of no previous history of GI bleed.  Last EGD, colonoscopy was in 2021, EGD was normal with no report of esophageal varices, colonoscopy reported external hemorrhoid again no evidence of varices at the time. ? ? ?ED: ?Blood pressure 114/81, pulse 93, temperature 98 ?F (36.7 ?C), resp. rate 16, height _0  (1.676 m), weight 63.5 kg, SpO2 100 %. ? ?Patient was found hypotensive on arrival with blood pressure of 87/64, extensive bright red blood and clot was noted in the rectal area by the ED staff ? ?Labs:  ?CBC WBC 17.8, hemoglobin 7.4, hematocrit 24.3, CO2 21, calcium  8.5, BUN 10, creatinine 0.97, glucose 107, BNP 608, ? ?CT abdomen pelvis was reviewed with Dr. Constance Haw: Demonstrating pneumobilia which is a concern for retroperitoneal air vs diverticulum vs mass vs ufistula ?General surgery Dr. Constance Haw and GI  was consulted for further evaluation ?Requested admission ? ? ? ?Assessment & Plan:  ? ?Principal Problem: ?  GIB (gastrointestinal bleeding) ?Active Problems: ?  Leukocytosis ?  Hypotension ?  Alcohol abuse ?  HTN (hypertension) ?  Alcoholic cirrhosis of liver with ascites ([place]) ?  Hypokalemia ?  Tobacco abuse ?  Iron deficiency anemia due to chronic blood loss ? ? ? ? ?Assessment and Plan: ?* GIB (gastrointestinal bleeding) ?- Acute GI bleed, frank bright red blood per rectum ?-03/10/2022 status post EGD: Finding ?Nonbleeding deep duodenal ulcer 30 mm in largest dimension and was circumferential.  This area was deep cavity of close to 2 cm in depth.  Positive for blood in the cavity,- A rounded cirumferential mass was present at the base of the ulcer - the mass was partially covered with fibrin..  Clot was not removed ? ? ?-Gastroenterologist and general surgery Dr. Constance Haw consulted ? ? ?-CT abdomen pelvis reviewed: Inflammatory process in the region of  pancreatic head involving second, third portion of duodenum, large roughly 6 cm area of edema with extraluminal air fluid in the region of pancreatic head associated with pneumobilia and CBD, extending into the left lobe of the liver.Wesley Francis ?Extensive differential including duodenal ulcer rupture fistula, diverticulum, mass, etc. ?-Dr. Constance Haw recommended Gastrografin studies for further evaluation-which revealed similar finding but no signs of per or fistula ? ? ?-NPO >>> if okay with GI we will advance to clear today ?-IV Protonix ? ? ? ?Hgb 7.4, -S/P 2 units of PRBC >> 8.4 today  ?hypotensive on arrival 87/64 >>112/77  ? ?------------------------------------------------------------------------------- ?Note: Patient was seen at  [facility] ED on 03/05/2022 hemoglobin was 6 was transfused 2U PRBC.  ? ?Hypotension ?- Arrival blood pressure was 87/64, status post IV fluid resuscitation initiating PRBC transfusion, blood pressure improved now 114/81 ?-We will monitor closely ?-Much improved and stable this a.m. ? ?Leukocytosis ?- Likely reactive due to acute GI bleed ?-WBC 17.8, neutrophil 15.7 >>> WBC 6.8 today  ?-Empiric IV antibiotic Zosyn initiated by ED was continued >> will DC today ? ?Hypokalemia ?- Repleting with IV fluids and p.o. when tolerated ? ?Alcoholic cirrhosis of liver with ascites ([place]) ?Stable ectatic iris- ?- Per patient and family Long history of alcohol abuse has cut down now drinks only on weekends ?Was diagnosed by PCP for liver cirrhosis referral to [facility] for evaluation ?- follow-up with an outpatient with [place] liver specialist ?-Was seen at [facility] on 03/05/2022 LFTs: AST 123, ALT 199, alk phos was 383, serial plasm, negative ANA, ferritin elevated 1417, iron saturation was 76 ? ?-Monitoring LFTs:  ?liver function tests ? ?  Latest Ref Rng & Units 03/10/2022  ?  3:57 AM 03/09/2022  ?  9:55 AM 12/22/2017  ?  4:41 AM  ?Hepatic Function  ?Total Protein 6.5 - 8.1 g/dL 5.3   5.9   6.4    ?Albumin 3.5 - 5.0 g/dL 2.7   3.0   3.4    ?AST 15 - 41 U/L 22   40   30    ?ALT 0 - 44 U/L 39   56   18    ?Alk Phosphatase 38 - 126 U/L 134   207   28    ?Total Bilirubin 0.3 - 1.2 mg/dL 2.9   2.5   0.6    ? ?Avoiding hepatotoxins.. ? ?HTN (hypertension) ?- Currently hypotensive with GI bleed, ?-Home medication of Norvasc, Toprol will be on hold for now ? ?Alcohol abuse ?-No signs of withdrawal yet ?-On CIWA protocol ? ?- History of chronic heavy alcohol abuse per family and patient for > 20 years, in the past few months he has only been drinking on the weekends ?-Last drink was 4 beers the night before admission ?-Discussed with the patient and the family regarding alcohol cessation ?-We will monitor patient  closely, initiating CIWA protocol ? ?Iron deficiency anemia due to chronic blood loss ?- Continue iron supplements ?-Hypotensive with hemoglobin of 7.4 >> 6.8 >>>> 8.4 ?- status post 2U PRBC in ED today 03/09/2022 ? ?Tobacco abuse ?- Approxi-1 pack a day, ?-Discussed with patient and family regarding smoking cessation ?-NicoDerm patch will be provided ? ? ? ? ? ? ? ?--------------------------------------------------------------------------------------------------------------------- ? ?DVT prophylaxis:  ?TED hose Start: 03/09/22 1436 ?SCDs Start: 03/09/22 1436 ? ? ?Code Status:   Code Status: Full Code ? ?Family Communication: Sister updated at bedside ?The above findings and plan of care has been discussed  with patient (and family)  in detail,  ?they expressed understanding and agreement of above. ?-Advance care planning has been discussed.  ? ?Admission status:   ?Status is: Inpatient ?Remains inpatient appropriate because: Requiring ICU admission and monitoring for active GI bleed... GI mass ? ? ? ? ?Procedures:  ? ?No admission procedures for hospital encounter. ? ? ?Antimicrobials:  ?Anti-infectives (From admission, onward)  ? ? Start     Dose/Rate Route Frequency Ordered Stop  ? 03/09/22 2200  piperacillin-tazobactam (ZOSYN) IVPB 3.375 g       ? 3.375 g ?12.5 mL/hr over 240 Minutes Intravenous Every 8 hours 03/09/22 1557    ? 03/09/22 1600  piperacillin-tazobactam (ZOSYN) IVPB 3.375 g  Status:  Discontinued       ? 3.375 g ?100 mL/hr over 30 Minutes Intravenous Every 8 hours 03/09/22 1551 03/09/22 1558  ? 03/09/22 1430  piperacillin-tazobactam (ZOSYN) IVPB 3.375 g       ? 3.375 g ?100 mL/hr over 30 Minutes Intravenous  Once 03/09/22 1415 03/09/22 1506  ? ?  ? ? ? ?Medication:  ? Chlorhexidine Gluconate Cloth  6 each Topical Daily  ? folic acid  1 mg Oral Daily  ? montelukast  10 mg Oral QHS  ? multivitamin with minerals  1 tablet Oral Daily  ? nicotine  21 mg Transdermal Daily  ? sodium chloride flush  3 mL  Intravenous Q12H  ? sodium chloride flush  3 mL Intravenous Q12H  ? thiamine  100 mg Intravenous Daily  ? ? ?sodium chloride, acetaminophen **OR** acetaminophen, bisacodyl, hydrALAZINE, HYDROmorphone (DILAUDID) i

## 2022-03-11 DIAGNOSIS — K922 Gastrointestinal hemorrhage, unspecified: Secondary | ICD-10-CM | POA: Diagnosis not present

## 2022-03-11 LAB — BPAM RBC
Blood Product Expiration Date: 202304252359
Blood Product Expiration Date: 202304252359
ISSUE DATE / TIME: 202304031751
ISSUE DATE / TIME: 202304040052
Unit Type and Rh: 6200
Unit Type and Rh: 6200

## 2022-03-11 LAB — TYPE AND SCREEN
ABO/RH(D): A POS
Antibody Screen: NEGATIVE
Unit division: 0
Unit division: 0

## 2022-03-11 LAB — CBC
HCT: 25.4 % — ABNORMAL LOW (ref 39.0–52.0)
Hemoglobin: 7.8 g/dL — ABNORMAL LOW (ref 13.0–17.0)
MCH: 27.5 pg (ref 26.0–34.0)
MCHC: 30.7 g/dL (ref 30.0–36.0)
MCV: 89.4 fL (ref 80.0–100.0)
Platelets: 258 10*3/uL (ref 150–400)
RBC: 2.84 MIL/uL — ABNORMAL LOW (ref 4.22–5.81)
RDW: 17.2 % — ABNORMAL HIGH (ref 11.5–15.5)
WBC: 10.8 10*3/uL — ABNORMAL HIGH (ref 4.0–10.5)
nRBC: 0 % (ref 0.0–0.2)

## 2022-03-11 LAB — COMPREHENSIVE METABOLIC PANEL
ALT: 30 U/L (ref 0–44)
AST: 17 U/L (ref 15–41)
Albumin: 2.5 g/dL — ABNORMAL LOW (ref 3.5–5.0)
Alkaline Phosphatase: 110 U/L (ref 38–126)
Anion gap: 6 (ref 5–15)
BUN: 10 mg/dL (ref 6–20)
CO2: 23 mmol/L (ref 22–32)
Calcium: 7.9 mg/dL — ABNORMAL LOW (ref 8.9–10.3)
Chloride: 109 mmol/L (ref 98–111)
Creatinine, Ser: 0.92 mg/dL (ref 0.61–1.24)
GFR, Estimated: >60 mL/min (ref >60)
Glucose, Bld: 103 mg/dL — ABNORMAL HIGH (ref 70–99)
Potassium: 3.3 mmol/L — ABNORMAL LOW (ref 3.5–5.1)
Sodium: 138 mmol/L (ref 135–145)
Total Bilirubin: 2.1 mg/dL — ABNORMAL HIGH (ref 0.3–1.2)
Total Protein: 4.9 g/dL — ABNORMAL LOW (ref 6.5–8.1)

## 2022-03-11 LAB — SURGICAL PATHOLOGY

## 2022-03-11 MED ORDER — SUCRALFATE 1 GM/10ML PO SUSP
1.0000 g | Freq: Three times a day (TID) | ORAL | Status: DC
  Administered 2022-03-11 – 2022-03-13 (×8): 1 g via ORAL
  Filled 2022-03-11 (×8): qty 10

## 2022-03-11 MED ORDER — POTASSIUM CHLORIDE CRYS ER 20 MEQ PO TBCR
40.0000 meq | EXTENDED_RELEASE_TABLET | Freq: Once | ORAL | Status: AC
  Administered 2022-03-11: 40 meq via ORAL
  Filled 2022-03-11: qty 2

## 2022-03-11 NOTE — Progress Notes (Signed)
?PROGRESS NOTE ? ? ? ?Wesley Francis  LFY:[id number] DOB: 08/16/1966 DOA: 03/09/2022 ?PCP: Jerel Shepherd, FNP ? ? ?Brief Narrative:  ? ? ?Wesley Francis is a 56 year old male with extensive history of HTN, chronic alcohol abuse, liver cirrhosis, arthritis, asthma, iron deficiency, chronic tobacco abuse, seasonal allergies presenting with acute rectal bleed. ? ?Patient reports his initial bowel movement in the morning was nonbloody, subsequently noticed frank bright red blood per rectum, according to the patient was massive. ? ?He admits that he is drinking alcohol only on the weekend. But he has history of heavy drinking drinking for over 20 yrs..  Possible having liver cirrhosis now... Was referred to a liver specialist at [facility] - he was seen on 03/05/2022, his  lab was checked subsequently noted for hemoglobin of 6.0, he was sent to the ED there and was transfused with 2U PRBC. ? ?He reports of no previous history of GI bleed.  Last EGD, colonoscopy was in 2021, EGD was normal with no report of esophageal varices, colonoscopy reported external hemorrhoid again no evidence of varices at the time. ? ?-He has undergone EGD 4/4 with findings of nonbleeding deep duodenal ulcer and a rounded circumferential mass at the base of the ulcer.  Multiple biopsies have been taken with no findings of malignancy noted as of yet.  Patient still requires further evaluation of the ampulla which will require ERCP versus EUS for further evaluation.  CA 19-9 level elevated.  ? ? ?Assessment & Plan: ?  ?Principal Problem: ?  GIB (gastrointestinal bleeding) ?Active Problems: ?  Leukocytosis ?  Hypotension ?  Alcohol abuse ?  HTN (hypertension) ?  Alcoholic cirrhosis of liver with ascites ([place]) ?  Hypokalemia ?  Tobacco abuse ?  Iron deficiency anemia due to chronic blood loss ?  Pneumobilia ? ?Assessment and Plan: ? ? ?GIB (gastrointestinal bleeding) ?- Acute GI bleed, frank bright red blood per rectum ?-03/10/2022 status post  EGD: Finding ?Nonbleeding deep duodenal ulcer 30 mm in largest dimension and was circumferential.  This area was deep cavity of close to 2 cm in depth.  Positive for blood in the cavity,- A rounded cirumferential mass was present at the base of the ulcer - the mass was partially covered with fibrin..  Clot was not removed ?  ?  ?-Gastroenterologist and general surgery Dr. Constance Haw consulted ?  ?  ?-CT abdomen pelvis reviewed: Inflammatory process in the region of pancreatic head involving second, third portion of duodenum, large roughly 6 cm area of edema with extraluminal air fluid in the region of pancreatic head associated with pneumobilia and CBD, extending into the left lobe of the liver.[name] ?Extensive differential including duodenal ulcer rupture fistula, diverticulum, mass, etc. ?-Dr. Constance Haw recommended Gastrografin studies for further evaluation-which revealed similar finding but no signs of per or fistula ?  ?  ?-NPO >>> if okay with GI we will advance diet; currently on clears ?-IV Protonix ?  ?-Hemoglobin continues to remain stable for now, transfuse for hemoglobin less than 7 ?  ?------------------------------------------------------------------------------- ?Note: Patient was seen at [facility] ED on 03/05/2022 hemoglobin was 6 was transfused 2U PRBC.  ? ?-Will require EUS vs ERCP with further GI input pending ?  ?Hypotension-improved ?- Arrival blood pressure was 87/64 ?-We will monitor closely ?-Much improved and stable at this time ?  ?Leukocytosis ?- Likely reactive ?-Continue to monitor and if uptrending, will order procalcitonin for further evaluation ?  ?Alcoholic cirrhosis of liver with ascites ([place]) ?  Stable ectatic iris- ?- Per patient and family Long history of alcohol abuse has cut down now drinks only on weekends ?Was diagnosed by PCP for liver cirrhosis referral to [facility] for evaluation ?- follow-up with an outpatient with [place] liver specialist ?-Was seen at [facility]  on 03/05/2022 LFTs: AST 123, ALT 199, alk phos was 383, serial plasm, negative ANA, ferritin elevated 1417, iron saturation was 76 ?  ?-Monitoring LFTs ?  ?Avoiding hepatotoxins.. ?  ?HTN (hypertension) ?- Currently hypotensive with GI bleed, ?-Home medication of Norvasc, Toprol will be on hold for now ?  ?Alcohol abuse ?-No signs of withdrawal yet ?-On CIWA protocol ?  ?- History of chronic heavy alcohol abuse per family and patient for > 20 years, in the past few months he has only been drinking on the weekends ?-Last drink was 4 beers the night before admission ?-Discussed with the patient and the family regarding alcohol cessation ?-We will monitor patient closely, initiating CIWA protocol ?  ?Iron deficiency anemia due to chronic blood loss ?- Continue iron supplements ?-Hypotensive with hemoglobin of 7.4 >> 6.8 >>>> 8.4 ?- status post 2U PRBC in ED today 03/09/2022 ?  ?Tobacco abuse ?- Approxi-1 pack a day, ?-Discussed with patient and family regarding smoking cessation ?-NicoDerm patch will be provided ?  ?Hypokalemia ?-Replete and reevaluate in a.m. ? ?DVT prophylaxis: SCDs ?Code Status: Full ?Family Communication: Sister at bedside 4/5 ?Disposition Plan:  ?Status is: Inpatient ?Remains inpatient appropriate because: Need for IV medications and fluids with further evaluation pending. ? ?Consultants:  ?GI ? ?Procedures:  ?EGD 4/4 with biopsy ? ?Antimicrobials:  ?Anti-infectives (From admission, onward)  ? ? Start     Dose/Rate Route Frequency Ordered Stop  ? 03/09/22 2200  piperacillin-tazobactam (ZOSYN) IVPB 3.375 g  Status:  Discontinued       ? 3.375 g ?12.5 mL/hr over 240 Minutes Intravenous Every 8 hours 03/09/22 1557 03/10/22 1333  ? 03/09/22 1600  piperacillin-tazobactam (ZOSYN) IVPB 3.375 g  Status:  Discontinued       ? 3.375 g ?100 mL/hr over 30 Minutes Intravenous Every 8 hours 03/09/22 1551 03/09/22 1558  ? 03/09/22 1430  piperacillin-tazobactam (ZOSYN) IVPB 3.375 g       ? 3.375 g ?100 mL/hr over 30  Minutes Intravenous  Once 03/09/22 1415 03/09/22 1506  ? ?  ? ?Subjective: ?Patient seen and evaluated today with no new acute complaints or concerns. No acute concerns or events noted overnight. ? ?Objective: ?Vitals:  ? 03/11/22 0300 03/11/22 0400 03/11/22 0500 03/11/22 0836  ?BP: 112/64 117/75    ?Pulse: 74 70 62   ?Resp: (!) 23 16 (!) 23   ?Temp:  98.4 ?F (36.9 ?C)  98.7 ?F (37.1 ?C)  ?TempSrc:  Oral  Oral  ?SpO2: 97% 98% 99%   ?Weight:   61.7 kg   ?Height:      ? ? ?Intake/Output Summary (Last 24 hours) at 03/11/2022 1401 ?Last data filed at 03/11/2022 1241 ?Gross per 24 hour  ?Intake 1173.69 ml  ?Output 502 ml  ?Net 671.69 ml  ? ?Filed Weights  ? 03/09/22 1649 03/10/22 0500 03/11/22 0500  ?Weight: 66.8 kg 60.9 kg 61.7 kg  ? ? ?Examination: ? ?General exam: Appears calm and comfortable  ?Respiratory system: Clear to auscultation. Respiratory effort normal. ?Cardiovascular system: S1 & S2 heard, RRR.  ?Gastrointestinal system: Abdomen is soft ?Central nervous system: Alert and awake ?Extremities: No edema ?Skin: No significant lesions noted ?Psychiatry: Flat affect. ? ? ? ?  Data Reviewed: I have personally reviewed following labs and imaging studies ? ?CBC: ?Recent Labs  ?Lab 03/09/22 ?0955 03/09/22 ?2306 03/10/22 ?3785 03/11/22 ?0405  ?WBC 17.8* 8.1 6.8 10.8*  ?NEUTROABS 15.7*  --   --   --   ?HGB 7.4* 6.8* 8.4* 7.8*  ?HCT 24.3* 21.2* 25.4* 25.4*  ?MCV 91.0 89.8 86.1 89.4  ?PLT 270 241 251 258  ? ?Basic Metabolic Panel: ?Recent Labs  ?Lab 03/09/22 ?8850 03/10/22 ?2774 03/11/22 ?0405  ?NA 141 139 138  ?K 4.0 3.1* 3.3*  ?CL 110 109 109  ?CO2 21* 23 23  ?GLUCOSE 102* 87 103*  ?BUN 10 24* 10  ?CREATININE 0.97 1.01 0.92  ?CALCIUM 8.5* 8.1* 7.9*  ? ?GFR: ?Estimated Creatinine Clearance: 78.2 mL/min (by C-G formula based on SCr of 0.92 mg/dL). ?Liver Function Tests: ?Recent Labs  ?Lab 03/09/22 ?1287 03/10/22 ?8676 03/11/22 ?0405  ?AST 40 22 17  ?ALT 56* 39 30  ?ALKPHOS 207* 134* 110  ?BILITOT 2.5* 2.9* 2.1*  ?PROT 5.9*  5.3* 4.9*  ?ALBUMIN 3.0* 2.7* 2.5*  ? ?Recent Labs  ?Lab 03/09/22 ?7209  ?LIPASE 49  ? ?No results for input(s): AMMONIA in the last 168 hours. ?Coagulation Profile: ?Recent Labs  ?Lab 03/09/22 ?[phone]

## 2022-03-11 NOTE — Progress Notes (Signed)
?Subjective: ?Patient feeling well this morning. Denies n/v or pain. States he had a BM this morning, though he did not look at it, felt that it was normal for him. He is feeling a little hungry.  ? ?Objective: ?Vital signs in last 24 hours: ?Temp:  [97.6 ?F (36.4 ?C)-99.5 ?F (37.5 ?C)] 98.4 ?F (36.9 ?C) (04/05 0400) ?Pulse Rate:  [62-88] 62 (04/05 0500) ?Resp:  [14-32] 23 (04/05 0500) ?BP: (95-159)/(56-92) 117/75 (04/05 0400) ?SpO2:  [89 %-100 %] 99 % (04/05 0500) ?Weight:  [61.7 kg] 61.7 kg (04/05 0500) ?Last BM Date : 03/11/22 ?General:   Alert and oriented, pleasant ?Head:  Normocephalic and atraumatic. ?Eyes:  No icterus, sclera clear. Conjuctiva pink.  ?Mouth:  Without lesions, mucosa pink and moist.  ?Heart:  S1, S2 present, no murmurs noted.  ?Lungs: Clear to auscultation bilaterally, without wheezing, rales, or rhonchi.  ?Abdomen:  Bowel sounds present, soft, non-tender, non-distended. No HSM or hernias noted. No rebound or guarding. No masses appreciated  ?Msk:  Symmetrical without gross deformities. Normal posture. ?Pulses:  Normal pulses noted. ?Extremities:  Without clubbing or edema. ?Neurologic:  Alert and  oriented x4;  grossly normal neurologically. ?Skin:  Warm and dry, intact without significant lesions.  ?Psych:  Alert and cooperative. Normal mood and affect. ? ?Intake/Output from previous day: ?04/04 0701 - 04/05 0700 ?In: 1873.7 [P.O.:330; I.V.:1464.4; IV Piggyback:79.3] ?Out: 500 [Urine:500] ?Intake/Output this shift: ?No intake/output data recorded. ? ?Lab Results: ?Recent Labs  ?  03/09/22 ?2306 03/10/22 ?0177 03/11/22 ?0405  ?WBC 8.1 6.8 10.8*  ?HGB 6.8* 8.4* 7.8*  ?HCT 21.2* 25.4* 25.4*  ?PLT 241 251 258  ? ?BMET ?Recent Labs  ?  03/09/22 ?0955 03/10/22 ?0357 03/11/22 ?0405  ?NA 141 139 138  ?K 4.0 3.1* 3.3*  ?CL 110 109 109  ?CO2 21* 23 23  ?GLUCOSE 102* 87 103*  ?BUN 10 24* 10  ?CREATININE 0.97 1.01 0.92  ?CALCIUM 8.5* 8.1* 7.9*  ? ?LFT ?Recent Labs  ?  03/09/22 ?0955 03/10/22 ?0357  03/11/22 ?0405  ?PROT 5.9* 5.3* 4.9*  ?ALBUMIN 3.0* 2.7* 2.5*  ?AST 40 22 17  ?ALT 56* 39 30  ?ALKPHOS 207* 134* 110  ?BILITOT 2.5* 2.9* 2.1*  ? ?PT/INR ?Recent Labs  ?  03/09/22 ?0955 03/10/22 ?0357  ?LABPROT 13.7 14.2  ?INR 1.1 1.1  ? ? ?Studies/Results: ?CT ABDOMEN WO CONTRAST ? ?Result Date: 03/09/2022 ?CLINICAL DATA:  Acute abdominal pain. Rectal bleeding. Questionable duodenal perforation. EXAM: CT ABDOMEN WITHOUT CONTRAST TECHNIQUE: Multidetector CT imaging of the abdomen was performed following the standard protocol without IV contrast. RADIATION DOSE REDUCTION: This exam was performed according to the departmental dose-optimization program which includes automated exposure control, adjustment of the mA and/or kV according to patient size and/or use of iterative reconstruction technique. COMPARISON:  CTA abdomen and pelvis 03/09/2022. FINDINGS: Lower chest: There is atelectasis or scarring in the lung bases. Hepatobiliary: Again seen is air within the gallbladder, common bile duct and left intrahepatic ducts similar to the prior study. No gallstones are identified. Liver otherwise appears within normal limits. Pancreas: Enlargement of the pancreatic head with surrounding inflammation is similar to the prior study. There is air within the pancreatic head, also unchanged. Pancreatic body and tail are within normal limits. Spleen: Normal in size without focal abnormality. Adrenals/Urinary Tract: There is a 15 mm cyst in the superior pole the right kidney. Additional hypodensity in the left kidney is too small to characterize, but also likely a cyst. There is no  hydronephrosis. Stomach/Bowel: Oral contrast is seen throughout the stomach and mid small bowel loops. Homogeneous density noted within the stomach lumen measuring 7.3 x 9.2 x 4.1 cm which may represent hematoma. Stomach is nondilated. There is no extravasation of oral contrast in the region of the duodenum. Duodenal wall thickening and inflammation versus  there is no free air in this region. Visualized small bowel loops are otherwise within normal limits. Vascular/Lymphatic: No significant vascular findings are present. No enlarged abdominal or pelvic lymph nodes. Other: No ascites or free air.  No focal abdominal wall hernia. Musculoskeletal: No acute or significant osseous findings. IMPRESSION: 1. Again seen is inflammation and wall thickening of the duodenum with enlargement of the pancreatic head. Central air in the pancreatic head is unchanged. There is no extravasation of oral contrast or free air to indicate bowel perforation. Oral contrast reaches mid small bowel. 2. Persistent pneumobilia of uncertain etiology. 3. Dense area in the stomach lumen may represent hematoma. Electronically Signed   By: Ronney Asters M.D.   On: 03/09/2022 16:53  ? ?MR 3D Recon At Scanner ? ?Result Date: 03/10/2022 ?CLINICAL DATA:  Off and on abdominal pain for 2 weeks EXAM: MRI ABDOMEN WITH CONTRAST (WITH MRCP) TECHNIQUE: Multiplanar multisequence MR imaging of the abdomen was performed following the administration of intravenous contrast. Heavily T2-weighted images of the biliary and pancreatic ducts were obtained, and three-dimensional MRCP images were rendered by post processing. CONTRAST:  38m GADAVIST GADOBUTROL 1 MMOL/ML IV SOLN COMPARISON:  CT 03/09/2022 FINDINGS: Lower chest:  Lung bases are clear. Hepatobiliary: No intrahepatic biliary duct dilatation. Gas within the gallbladder with the gallbladder is non distended. No enhancing hepatic lesion. Pancreas: In the region of the head of the pancreas, there is a thick-walled lesion with central gas measuring 4.1 x 4.8 cm (image 53/series 23). Favor this lesion to represent thick-walled duodenal diverticulum or duodenal ulcer. The wall thickness measures 15 mm (image 52/series 21. There is central nodularity within the cavity. There is no pancreatic duct dilatation. No biliary duct dilatation. Again demonstrated small volume  pneumobilia present. Spleen: Normal spleen. Adrenals/urinary tract: Adrenal glands normal. Nonenhancing cyst of the RIGHT kidney measures 12 mm. Stomach/Bowel: Stomach is normal. Interval clearing of potential hematoma seen on comparison CTs. No evidence of outlet obstruction. The first and second portion the duodenum appear normal. Potential diverticulum of the second portion duodenum as described the pancreatic section. The third portion the duodenum and limited view of the bowel is unremarkable. Vascular/Lymphatic: Abdominal aortic normal caliber. No retroperitoneal periportal lymphadenopathy. Musculoskeletal: No aggressive osseous lesion IMPRESSION: 1. Thick-walled large ulcerated duodenum diverticulum versus thick-walled duodenal ulcer measuring up to 5 cm. Cannot exclude a exclude malignancy within the diverticulum/ulcer. Recommend upper endoscopy for further characterization 2. Pneumobilia suggest potential erosion into the distal common bile duct by the duodenum process. 3. No hepatic metastasis. 4. No gastric outlet obstruction. 5. No retroperitoneal or periportal adenopathy. Electronically Signed   By: SSuzy BouchardM.D.   On: 03/10/2022 17:16  ? ?MR ABDOMEN WITH MRCP W CONTRAST ? ?Result Date: 03/10/2022 ?CLINICAL DATA:  Off and on abdominal pain for 2 weeks EXAM: MRI ABDOMEN WITH CONTRAST (WITH MRCP) TECHNIQUE: Multiplanar multisequence MR imaging of the abdomen was performed following the administration of intravenous contrast. Heavily T2-weighted images of the biliary and pancreatic ducts were obtained, and three-dimensional MRCP images were rendered by post processing. CONTRAST:  610mGADAVIST GADOBUTROL 1 MMOL/ML IV SOLN COMPARISON:  CT 03/09/2022 FINDINGS: Lower chest:  Lung bases are  clear. Hepatobiliary: No intrahepatic biliary duct dilatation. Gas within the gallbladder with the gallbladder is non distended. No enhancing hepatic lesion. Pancreas: In the region of the head of the pancreas, there  is a thick-walled lesion with central gas measuring 4.1 x 4.8 cm (image 53/series 23). Favor this lesion to represent thick-walled duodenal diverticulum or duodenal ulcer. The wall thickness measures 15 mm (

## 2022-03-11 NOTE — TOC Progression Note (Signed)
Transition of Care (TOC) - Progression Note  ? ? ?Patient Details  ?Name: ENDY EASTERLY ?MRN: [id number] ?Date of Birth: 03-08-1966 ? ?Transition of Care (TOC) CM/SW Contact  ?Salome Arnt, LCSW ?Phone Number: ?03/11/2022, 2:51 PM ? ?Clinical Narrative: Pt's sister called regarding HHPT. She would prefer HHPT over outpatient if possible. LCSW messaged [facility], [facility], [place], [place], [place], [place], [place], [place], and [place]. [facility] willing to review. LCSW faxed referral to Randall Hiss at [phone]. Will follow up in AM.   ? ? ? ?  ?Barriers to Discharge: Continued Medical Work up ? ?Expected Discharge Plan and Services ?  ?  ?  ?  ?  ?                ?  ?  ?  ?  ?  ?  ?  ?  ?  ?  ? ? ?Social Determinants of Health (SDOH) Interventions ?  ? ?Readmission Risk Interventions ?   ? View : No data to display.  ?  ?  ?  ? ? ?

## 2022-03-12 ENCOUNTER — Encounter (HOSPITAL_COMMUNITY): Payer: Self-pay | Admitting: Gastroenterology

## 2022-03-12 DIAGNOSIS — K922 Gastrointestinal hemorrhage, unspecified: Secondary | ICD-10-CM | POA: Diagnosis not present

## 2022-03-12 LAB — COMPREHENSIVE METABOLIC PANEL
ALT: 25 U/L (ref 0–44)
AST: 16 U/L (ref 15–41)
Albumin: 2.4 g/dL — ABNORMAL LOW (ref 3.5–5.0)
Alkaline Phosphatase: 102 U/L (ref 38–126)
Anion gap: 6 (ref 5–15)
BUN: 5 mg/dL — ABNORMAL LOW (ref 6–20)
CO2: 21 mmol/L — ABNORMAL LOW (ref 22–32)
Calcium: 8.1 mg/dL — ABNORMAL LOW (ref 8.9–10.3)
Chloride: 110 mmol/L (ref 98–111)
Creatinine, Ser: 0.83 mg/dL (ref 0.61–1.24)
GFR, Estimated: >60 mL/min (ref >60)
Glucose, Bld: 96 mg/dL (ref 70–99)
Potassium: 3.5 mmol/L (ref 3.5–5.1)
Sodium: 137 mmol/L (ref 135–145)
Total Bilirubin: 1.9 mg/dL — ABNORMAL HIGH (ref 0.3–1.2)
Total Protein: 4.9 g/dL — ABNORMAL LOW (ref 6.5–8.1)

## 2022-03-12 LAB — CBC
HCT: 24.3 % — ABNORMAL LOW (ref 39.0–52.0)
Hemoglobin: 7.9 g/dL — ABNORMAL LOW (ref 13.0–17.0)
MCH: 29.3 pg (ref 26.0–34.0)
MCHC: 32.5 g/dL (ref 30.0–36.0)
MCV: 90 fL (ref 80.0–100.0)
Platelets: 264 10*3/uL (ref 150–400)
RBC: 2.7 MIL/uL — ABNORMAL LOW (ref 4.22–5.81)
RDW: 16.6 % — ABNORMAL HIGH (ref 11.5–15.5)
WBC: 6.6 10*3/uL (ref 4.0–10.5)
nRBC: 0 % (ref 0.0–0.2)

## 2022-03-12 LAB — MAGNESIUM: Magnesium: 1.4 mg/dL — ABNORMAL LOW (ref 1.7–2.4)

## 2022-03-12 LAB — GLUCOSE, CAPILLARY: Glucose-Capillary: 110 mg/dL — ABNORMAL HIGH (ref 70–99)

## 2022-03-12 MED ORDER — MAGNESIUM SULFATE 2 GM/50ML IV SOLN
2.0000 g | Freq: Once | INTRAVENOUS | Status: AC
Start: 2022-03-12 — End: 2022-03-13
  Administered 2022-03-12: 2 g via INTRAVENOUS
  Filled 2022-03-12: qty 50

## 2022-03-12 MED ORDER — CIPROFLOXACIN IN D5W 400 MG/200ML IV SOLN
400.0000 mg | Freq: Two times a day (BID) | INTRAVENOUS | Status: DC
Start: 2022-03-12 — End: 2022-03-12
  Filled 2022-03-12: qty 200

## 2022-03-12 MED ORDER — METRONIDAZOLE 500 MG/100ML IV SOLN
500.0000 mg | Freq: Three times a day (TID) | INTRAVENOUS | Status: DC
  Administered 2022-03-12: 500 mg via INTRAVENOUS
  Filled 2022-03-12: qty 100

## 2022-03-12 NOTE — Progress Notes (Addendum)
? ?Gastroenterology Progress Note  ? ?Referring Provider: No ref. provider found ?Primary Care Physician:  Jerel Shepherd, [place] ?Primary Gastroenterologist:  Dr. Alice Reichert ([place]) ? ?Patient ID: Wesley Francis; [id number]; 1966/05/21  ? ? ?Subjective  ? ?Patient reports he has been doing well. No abdominal pain. Denies any hematemesis, melena, or hematochezia. Has reported that stools have been brown in color and soft. Only having some right sided back pain. Tolerating full liquid diet without issue. Sister at bedside with questions about needed d/u in [place] and plan going forward. Spoke to patient and sister about Dr. Ardis Hughs recommendations of continuing PPI, empiric antibiotics, and repeat endoscopy and have no further questions at this time.  ? ? ?Objective  ? ?Vital signs in last 24 hours ?Temp:  [98.6 ?F (37 ?C)-98.9 ?F (37.2 ?C)] 98.6 ?F (37 ?C) (04/06 0737) ?Pulse Rate:  [68-83] 78 (04/06 0100) ?Resp:  [15-24] 19 (04/06 0100) ?BP: (101-122)/(57-73) 101/62 (04/06 0100) ?SpO2:  [96 %-99 %] 97 % (04/06 0100) ?Weight:  [64.6 kg] 64.6 kg (04/06 0550) ?Last BM Date : 03/11/22 ? ?Physical Exam ?General:   Alert and oriented, pleasant ?Head:  Normocephalic and atraumatic. ?Eyes:  No icterus, sclera clear. Conjuctiva pink.  ?Mouth:  Without lesions, mucosa pink and moist.  ?Heart:  S1, S2 present, no murmurs noted.  ?Lungs: Clear to auscultation bilaterally, without wheezing, rales, or rhonchi.  ?Abdomen:  Bowel sounds present, soft, non-tender, non-distended. No HSM or hernias noted. No rebound or guarding. No masses appreciated  ?Msk:  Symmetrical without gross deformities. Normal posture. ?Pulses:  Normal pulses noted. ?Extremities:  Without clubbing or edema. ?Neurologic:  Alert and  oriented x4;  grossly normal neurologically. ?Skin:  Warm and dry, intact without significant lesions.  ?Psych:  Alert and cooperative. Normal mood and affect. ? ?Intake/Output from previous day: ?04/05 0701 - 04/06  0700 ?In: 700.1 [P.O.:200; I.V.:500.1] ?Out: 1927 [XIHWT:8882; Stool:2] ?Intake/Output this shift: ?No intake/output data recorded. ? ?Lab Results ? ?Recent Labs  ?  03/10/22 ?8003 03/11/22 ?0405 03/12/22 ?0409  ?WBC 6.8 10.8* 6.6  ?HGB 8.4* 7.8* 7.9*  ?HCT 25.4* 25.4* 24.3*  ?PLT 251 258 264  ? ?BMET ?Recent Labs  ?  03/10/22 ?4917 03/11/22 ?0405 03/12/22 ?0409  ?NA 139 138 137  ?K 3.1* 3.3* 3.5  ?CL 109 109 110  ?CO2 23 23 21*  ?GLUCOSE 87 103* 96  ?BUN 24* 10 5*  ?CREATININE 1.01 0.92 0.83  ?CALCIUM 8.1* 7.9* 8.1*  ? ?LFT ?Recent Labs  ?  03/10/22 ?9150 03/11/22 ?0405 03/12/22 ?0409  ?PROT 5.3* 4.9* 4.9*  ?ALBUMIN 2.7* 2.5* 2.4*  ?AST '22 17 16  '$ ?ALT 39 30 25  ?ALKPHOS 134* 110 102  ?BILITOT 2.9* 2.1* 1.9*  ? ?PT/INR ?Recent Labs  ?  03/09/22 ?0955 03/10/22 ?0357  ?LABPROT 13.7 14.2  ?INR 1.1 1.1  ? ?Hepatitis Panel ?No results for input(s): HEPBSAG, HCVAB, HEPAIGM, HEPBIGM in the last 72 hours. ? ? ?Studies/Results ?CT ABDOMEN WO CONTRAST ? ?Result Date: 03/09/2022 ?CLINICAL DATA:  Acute abdominal pain. Rectal bleeding. Questionable duodenal perforation. EXAM: CT ABDOMEN WITHOUT CONTRAST TECHNIQUE: Multidetector CT imaging of the abdomen was performed following the standard protocol without IV contrast. RADIATION DOSE REDUCTION: This exam was performed according to the departmental dose-optimization program which includes automated exposure control, adjustment of the mA and/or kV according to patient size and/or use of iterative reconstruction technique. COMPARISON:  CTA abdomen and pelvis 03/09/2022. FINDINGS: Lower chest: There is atelectasis or scarring in  the lung bases. Hepatobiliary: Again seen is air within the gallbladder, common bile duct and left intrahepatic ducts similar to the prior study. No gallstones are identified. Liver otherwise appears within normal limits. Pancreas: Enlargement of the pancreatic head with surrounding inflammation is similar to the prior study. There is air within the pancreatic  head, also unchanged. Pancreatic body and tail are within normal limits. Spleen: Normal in size without focal abnormality. Adrenals/Urinary Tract: There is a 15 mm cyst in the superior pole the right kidney. Additional hypodensity in the left kidney is too small to characterize, but also likely a cyst. There is no hydronephrosis. Stomach/Bowel: Oral contrast is seen throughout the stomach and mid small bowel loops. Homogeneous density noted within the stomach lumen measuring 7.3 x 9.2 x 4.1 cm which may represent hematoma. Stomach is nondilated. There is no extravasation of oral contrast in the region of the duodenum. Duodenal wall thickening and inflammation versus there is no free air in this region. Visualized small bowel loops are otherwise within normal limits. Vascular/Lymphatic: No significant vascular findings are present. No enlarged abdominal or pelvic lymph nodes. Other: No ascites or free air.  No focal abdominal wall hernia. Musculoskeletal: No acute or significant osseous findings. IMPRESSION: 1. Again seen is inflammation and wall thickening of the duodenum with enlargement of the pancreatic head. Central air in the pancreatic head is unchanged. There is no extravasation of oral contrast or free air to indicate bowel perforation. Oral contrast reaches mid small bowel. 2. Persistent pneumobilia of uncertain etiology. 3. Dense area in the stomach lumen may represent hematoma. Electronically Signed   By: Ronney Asters M.D.   On: 03/09/2022 16:53  ? ?DG Chest 2 View ? ?Result Date: 02/15/2022 ?CLINICAL DATA:  Hiccups for 3 days, intractable, history asthma, hypertension, ethanol abuse, smoker EXAM: CHEST - 2 VIEW COMPARISON:  12/20/2017 FINDINGS: Normal heart size, mediastinal contours, and pulmonary vascularity. Linear scarring in lingula. Emphysematous changes without pulmonary infiltrate, pleural effusion, or pneumothorax. Osseous structures unremarkable. IMPRESSION: Emphysematous changes with minimal  linear scarring in lingula. No acute abnormalities. Emphysema (ICD10-J43.9). Electronically Signed   By: Lavonia Dana M.D.   On: 02/15/2022 19:44  ? ?MR 3D Recon At Scanner ? ?Result Date: 03/10/2022 ?CLINICAL DATA:  Off and on abdominal pain for 2 weeks EXAM: MRI ABDOMEN WITH CONTRAST (WITH MRCP) TECHNIQUE: Multiplanar multisequence MR imaging of the abdomen was performed following the administration of intravenous contrast. Heavily T2-weighted images of the biliary and pancreatic ducts were obtained, and three-dimensional MRCP images were rendered by post processing. CONTRAST:  56m GADAVIST GADOBUTROL 1 MMOL/ML IV SOLN COMPARISON:  CT 03/09/2022 FINDINGS: Lower chest:  Lung bases are clear. Hepatobiliary: No intrahepatic biliary duct dilatation. Gas within the gallbladder with the gallbladder is non distended. No enhancing hepatic lesion. Pancreas: In the region of the head of the pancreas, there is a thick-walled lesion with central gas measuring 4.1 x 4.8 cm (image 53/series 23). Favor this lesion to represent thick-walled duodenal diverticulum or duodenal ulcer. The wall thickness measures 15 mm (image 52/series 21. There is central nodularity within the cavity. There is no pancreatic duct dilatation. No biliary duct dilatation. Again demonstrated small volume pneumobilia present. Spleen: Normal spleen. Adrenals/urinary tract: Adrenal glands normal. Nonenhancing cyst of the RIGHT kidney measures 12 mm. Stomach/Bowel: Stomach is normal. Interval clearing of potential hematoma seen on comparison CTs. No evidence of outlet obstruction. The first and second portion the duodenum appear normal. Potential diverticulum of the second portion duodenum as  described the pancreatic section. The third portion the duodenum and limited view of the bowel is unremarkable. Vascular/Lymphatic: Abdominal aortic normal caliber. No retroperitoneal periportal lymphadenopathy. Musculoskeletal: No aggressive osseous lesion IMPRESSION: 1.  Thick-walled large ulcerated duodenum diverticulum versus thick-walled duodenal ulcer measuring up to 5 cm. Cannot exclude a exclude malignancy within the diverticulum/ulcer. Recommend upper endoscopy f

## 2022-03-12 NOTE — Progress Notes (Signed)
?PROGRESS NOTE ? ? ? ?Wesley Francis  CHE:[id number] DOB: 1966-11-11 DOA: 03/09/2022 ?PCP: Wesley Shepherd, FNP ? ? ?Brief Narrative:  ? ? ?Wesley Francis is a 56 year old male with extensive history of HTN, chronic alcohol abuse, liver cirrhosis, arthritis, asthma, iron deficiency, chronic tobacco abuse, seasonal allergies presenting with acute rectal bleed. ? ?Patient reports his initial bowel movement in the morning was nonbloody, subsequently noticed frank bright red blood per rectum, according to the patient was massive. ? ?He admits that he is drinking alcohol only on the weekend. But he has history of heavy drinking drinking for over 20 yrs..  Possible having liver cirrhosis now... Was referred to a liver specialist at [facility] - he was seen on 03/05/2022, his  lab was checked subsequently noted for hemoglobin of 6.0, he was sent to the ED there and was transfused with 2U PRBC. ? ?He reports of no previous history of GI bleed.  Last EGD, colonoscopy was in 2021, EGD was normal with no report of esophageal varices, colonoscopy reported external hemorrhoid again no evidence of varices at the time. ? ?-He has undergone EGD 4/4 with findings of nonbleeding deep duodenal ulcer and a rounded circumferential mass at the base of the ulcer.  Multiple biopsies have been taken with no findings of malignancy noted as of yet.  Plans for further endoscopy in outpatient setting.  Continue PPI infusion and IV antibiotics.  ? ? ?Assessment & Plan: ?  ?Principal Problem: ?  GIB (gastrointestinal bleeding) ?Active Problems: ?  Leukocytosis ?  Hypotension ?  Alcohol abuse ?  HTN (hypertension) ?  Alcoholic cirrhosis of liver with ascites ([place]) ?  Hypokalemia ?  Tobacco abuse ?  Iron deficiency anemia due to chronic blood loss ?  Pneumobilia ? ?Assessment and Plan: ? ? ?GIB (gastrointestinal bleeding) ?- Acute GI bleed, frank bright red blood per rectum ?-03/10/2022 status post EGD: Finding ?Nonbleeding deep duodenal ulcer  30 mm in largest dimension and was circumferential.  This area was deep cavity of close to 2 cm in depth.  Positive for blood in the cavity,- A rounded cirumferential mass was present at the base of the ulcer - the mass was partially covered with fibrin..  Clot was not removed ?  ?  ?-Gastroenterologist and general surgery Dr. Constance Haw consulted ?  ?  ?-CT abdomen pelvis reviewed: Inflammatory process in the region of pancreatic head involving second, third portion of duodenum, large roughly 6 cm area of edema with extraluminal air fluid in the region of pancreatic head associated with pneumobilia and CBD, extending into the left lobe of the liver.[name] ?Extensive differential including duodenal ulcer rupture fistula, diverticulum, mass, etc. ?-Dr. Constance Haw recommended Gastrografin studies for further evaluation-which revealed similar finding but no signs of per or fistula ?  ?  ?-NPO >>> if okay with GI we will advance diet; currently on clears ?-IV Protonix ?  ?-Hemoglobin continues to remain stable for now, transfuse for hemoglobin less than 7 ?  ?------------------------------------------------------------------------------- ?Note: Patient was seen at [facility] ED on 03/05/2022 hemoglobin was 6 was transfused 2U PRBC.  ?  ?-GI planning to initiate IV antibiotics and maintain on PPI infusion total 72 hours prior to discharge.  Advanced diet to soft 4/6. ?  ?Hypotension-improved ?- Arrival blood pressure was 87/64 ?-We will monitor closely ?-Much improved and stable at this time ?  ?Leukocytosis ?- Likely reactive ?-Continue to monitor and if uptrending, will order procalcitonin for further evaluation ?  ?Alcoholic  cirrhosis of liver with ascites ([place]) ?Stable ectatic iris- ?- Per patient and family Long history of alcohol abuse has cut down now drinks only on weekends ?Was diagnosed by PCP for liver cirrhosis referral to [facility] for evaluation ?- follow-up with an outpatient with [place] liver  specialist ?-Was seen at [facility] on 03/05/2022 LFTs: AST 123, ALT 199, alk phos was 383, serial plasm, negative ANA, ferritin elevated 1417, iron saturation was 76 ?  ?-Monitoring LFTs ?  ?Avoiding hepatotoxins.. ?  ?HTN (hypertension) ?- Currently hypotensive with GI bleed, ?-Home medication of Norvasc, Toprol will be on hold for now ?  ?Alcohol abuse ?-No signs of withdrawal yet ?-On CIWA protocol ?  ?- History of chronic heavy alcohol abuse per family and patient for > 20 years, in the past few months he has only been drinking on the weekends ?-Last drink was 4 beers the night before admission ?-Discussed with the patient and the family regarding alcohol cessation ?-We will monitor patient closely, initiating CIWA protocol ?  ?Iron deficiency anemia due to chronic blood loss ?- Continue iron supplements ?-Hypotensive with hemoglobin of 7.4 >> 6.8 >>>> 8.4 ?- status post 2U PRBC in ED today 03/09/2022 ?  ?Tobacco abuse ?- Approxi-1 pack a day, ?-Discussed with patient and family regarding smoking cessation ?-NicoDerm patch will be provided ?  ?Hypokalemia ?-Replete and reevaluate in a.m. ?  ?DVT prophylaxis: SCDs ?Code Status: Full ?Family Communication: Sister at bedside 4/6 ?Disposition Plan:  ?Status is: Inpatient ?Remains inpatient appropriate because: Need for IV medications and fluids with further evaluation pending. ?  ?Consultants:  ?GI ?  ?Procedures:  ?EGD 4/4 with biopsy ?  ?Antimicrobials:  ?Anti-infectives (From admission, onward)  ? ? Start     Dose/Rate Route Frequency Ordered Stop  ? 03/09/22 2200  piperacillin-tazobactam (ZOSYN) IVPB 3.375 g  Status:  Discontinued       ? 3.375 g ?12.5 mL/hr over 240 Minutes Intravenous Every 8 hours 03/09/22 1557 03/10/22 1333  ? 03/09/22 1600  piperacillin-tazobactam (ZOSYN) IVPB 3.375 g  Status:  Discontinued       ? 3.375 g ?100 mL/hr over 30 Minutes Intravenous Every 8 hours 03/09/22 1551 03/09/22 1558  ? 03/09/22 1430  piperacillin-tazobactam (ZOSYN)  IVPB 3.375 g       ? 3.375 g ?100 mL/hr over 30 Minutes Intravenous  Once 03/09/22 1415 03/09/22 1506  ? ?  ? ? ?Subjective: ?Patient seen and evaluated today with no new acute complaints or concerns. No acute concerns or events noted overnight.  Tolerating diet with no abdominal pain or bleeding concerns. ? ?Objective: ?Vitals:  ? 03/12/22 0100 03/12/22 0550 03/12/22 0737 03/12/22 1100  ?BP: 101/62     ?Pulse: 78     ?Resp: 19     ?Temp:  98.7 ?F (37.1 ?C) 98.6 ?F (37 ?C) 98.6 ?F (37 ?C)  ?TempSrc:  Oral Oral Oral  ?SpO2: 97%     ?Weight:  64.6 kg    ?Height:      ? ? ?Intake/Output Summary (Last 24 hours) at 03/12/2022 1329 ?Last data filed at 03/12/2022 0551 ?Gross per 24 hour  ?Intake 700.14 ml  ?Output 1925 ml  ?Net -1224.86 ml  ? ?Filed Weights  ? 03/10/22 0500 03/11/22 0500 03/12/22 0550  ?Weight: 60.9 kg 61.7 kg 64.6 kg  ? ? ?Examination: ? ?General exam: Appears calm and comfortable  ?Respiratory system: Clear to auscultation. Respiratory effort normal. ?Cardiovascular system: S1 & S2 heard, RRR.  ?  Gastrointestinal system: Abdomen is soft ?Central nervous system: Alert and awake ?Extremities: No edema ?Skin: No significant lesions noted ?Psychiatry: Flat affect. ? ? ? ?Data Reviewed: I have personally reviewed following labs and imaging studies ? ?CBC: ?Recent Labs  ?Lab 03/09/22 ?0955 03/09/22 ?2306 03/10/22 ?6503 03/11/22 ?0405 03/12/22 ?0409  ?WBC 17.8* 8.1 6.8 10.8* 6.6  ?NEUTROABS 15.7*  --   --   --   --   ?HGB 7.4* 6.8* 8.4* 7.8* 7.9*  ?HCT 24.3* 21.2* 25.4* 25.4* 24.3*  ?MCV 91.0 89.8 86.1 89.4 90.0  ?PLT 270 241 251 258 264  ? ?Basic Metabolic Panel: ?Recent Labs  ?Lab 03/09/22 ?5465 03/10/22 ?6812 03/11/22 ?0405 03/12/22 ?0409  ?NA 141 139 138 137  ?K 4.0 3.1* 3.3* 3.5  ?CL 110 109 109 110  ?CO2 21* 23 23 21*  ?GLUCOSE 102* 87 103* 96  ?BUN 10 24* 10 5*  ?CREATININE 0.97 1.01 0.92 0.83  ?CALCIUM 8.5* 8.1* 7.9* 8.1*  ?MG  --   --   --  1.4*  ? ?GFR: ?Estimated Creatinine Clearance: 89.7 mL/min (by C-G  formula based on SCr of 0.83 mg/dL). ?Liver Function Tests: ?Recent Labs  ?Lab 03/09/22 ?7517 03/10/22 ?0017 03/11/22 ?0405 03/12/22 ?0409  ?AST 40 22 17 16   ?ALT 56* 39 30 25  ?ALKPHOS 207* 134* 110 102  ?BILIT

## 2022-03-12 NOTE — Progress Notes (Signed)
Physical Therapy Treatment ?Patient Details ?Name: Wesley Francis ?MRN: [id number] ?DOB: [birth year] ?Today's Date: 03/12/2022 ? ? ?History of Present Illness KMARI HALTER is a 56 year old male with extensive history of HTN, chronic alcohol abuse, liver cirrhosis, arthritis, asthma, iron deficiency, chronic tobacco abuse, seasonal allergies presenting with acute rectal bleed. ? ?  ?PT Comments  ? ? Patient without assist with bed mobility and demonstrates good sitting balance at EOB without reports of dizziness. Pt able to tansfer to standing without AD with a little unsteadiness noted upon initial standing.  Initially only wanting to ambulate to chair but therapist able to convince further distance this session.  Pt began ambulation with UE assist by use of IV pole initially then able to complete distance without AD.   Patient ambulates with slow, labored cadence but without loss of balance. Patient ambulated to chair at bedside with sister present and callbell near. Patient will benefit from continued physical therapy in hospital and recommended venue below to increase strength, balance, endurance for safe ADLs and gait. ?  ?Recommendations for follow up therapy are one component of a multi-disciplinary discharge planning process, led by the attending physician.  Recommendations may be updated based on patient status, additional functional criteria and insurance authorization. ? ?Follow Up Recommendations ? Home health PT ?  ?  ?Assistance Recommended at Discharge Intermittent Supervision/Assistance  ?Patient can return home with the following A little help with walking and/or transfers;A little help with bathing/dressing/bathroom;Assistance with cooking/housework;Assist for transportation;Help with stairs or ramp for entrance ?  ?Equipment Recommendations ? None recommended by PT  ?  ?   ?Precautions / Restrictions Precautions ?Precautions: Fall  ?  ? ?Mobility ? Bed Mobility ?Overal bed mobility: Modified  Independent ?  ?  ?  ?  ?  ?  ?  ?  ? ?Transfers ?Overall transfer level: Modified independent ?Equipment used: 1 person hand held assist ?Transfers: Sit to/from Stand ?Sit to Stand: Modified independent (Device/Increase time) ?  ?  ?  ?  ?  ?General transfer comment: a little unstable when initially stood and began to ambulate but overall improvement and only needing HHA ?  ? ?Ambulation/Gait ?Ambulation/Gait assistance: Min guard ?Gait Distance (Feet): 75 Feet ?Assistive device: 1 person hand held assist ?Gait Pattern/deviations: Decreased stride length ?  ?  ?  ?General Gait Details: increased distance today with no issues; tends to reach for objects outside BOS ? ? ? ?  ?   ?Cognition Arousal/Alertness: Awake/alert ?Behavior During Therapy: [facility] for tasks assessed/performed ?Overall Cognitive Status: Within Functional Limits for tasks assessed ?  ?  ?  ?  ?  ?  ?  ?  ?  ?  ?  ?  ?  ?  ?  ?  ?  ?  ?  ? ?  ?   ?   ? ?Pertinent Vitals/Pain Pain Assessment ?Pain Assessment: No/denies pain  ? ? ?   ?   ? ?PT Goals (current goals can now be found in the care plan section) Progress towards PT goals: Progressing toward goals ? ?  ?Frequency ? ? ? Min 3X/week ? ? ? ?  ?PT Plan  Progress towards goals  ? ? ?   ?AM-PAC PT "6 Clicks" Mobility   ?Outcome Measure ? Help needed turning from your back to your side while in a flat bed without using bedrails?: None ?Help needed moving from lying on your back to sitting on the side of a flat bed without  using bedrails?: None ?Help needed moving to and from a bed to a chair (including a wheelchair)?: A Little ?Help needed standing up from a chair using your arms (e.g., wheelchair or bedside chair)?: A Little ?Help needed to walk in hospital room?: A Little ?Help needed climbing 3-5 steps with a railing? : A Lot ?6 Click Score: 19 ? ?  ?End of Session Equipment Utilized During Treatment: Gait belt ?Activity Tolerance: Patient tolerated treatment well;Patient limited by  fatigue ?Patient left: in chair;with call bell/phone within reach ?Nurse Communication: Mobility status ?PT Visit Diagnosis: Unsteadiness on feet (R26.81);Muscle weakness (generalized) (M62.81);Other abnormalities of gait and mobility (R26.89) ?  ? ? ?Time: 6314-9702 ?PT Time Calculation (min) (ACUTE ONLY): 17 min ? ?Charges:  $Gait Training: 8-22 mins          ?          ? ?Teena Irani, PTA/CLT, WTA ?[phone] ? ? ? ?Mare Ferrari, Nathaniel Wakeley B ?03/12/2022, 12:05 PM ? ?

## 2022-03-13 ENCOUNTER — Telehealth: Payer: Self-pay | Admitting: Internal Medicine

## 2022-03-13 DIAGNOSIS — K838 Other specified diseases of biliary tract: Secondary | ICD-10-CM

## 2022-03-13 DIAGNOSIS — K269 Duodenal ulcer, unspecified as acute or chronic, without hemorrhage or perforation: Secondary | ICD-10-CM

## 2022-03-13 DIAGNOSIS — K264 Chronic or unspecified duodenal ulcer with hemorrhage: Secondary | ICD-10-CM

## 2022-03-13 DIAGNOSIS — K922 Gastrointestinal hemorrhage, unspecified: Secondary | ICD-10-CM | POA: Diagnosis not present

## 2022-03-13 LAB — CBC
HCT: 24.5 % — ABNORMAL LOW (ref 39.0–52.0)
Hemoglobin: 7.6 g/dL — ABNORMAL LOW (ref 13.0–17.0)
MCH: 28 pg (ref 26.0–34.0)
MCHC: 31 g/dL (ref 30.0–36.0)
MCV: 90.4 fL (ref 80.0–100.0)
Platelets: 284 10*3/uL (ref 150–400)
RBC: 2.71 MIL/uL — ABNORMAL LOW (ref 4.22–5.81)
RDW: 15.7 % — ABNORMAL HIGH (ref 11.5–15.5)
WBC: 6.9 10*3/uL (ref 4.0–10.5)
nRBC: 0 % (ref 0.0–0.2)

## 2022-03-13 LAB — MAGNESIUM: Magnesium: 1.9 mg/dL (ref 1.7–2.4)

## 2022-03-13 LAB — GLUCOSE, CAPILLARY: Glucose-Capillary: 93 mg/dL (ref 70–99)

## 2022-03-13 LAB — BASIC METABOLIC PANEL
Anion gap: 6 (ref 5–15)
BUN: 6 mg/dL (ref 6–20)
CO2: 21 mmol/L — ABNORMAL LOW (ref 22–32)
Calcium: 8.4 mg/dL — ABNORMAL LOW (ref 8.9–10.3)
Chloride: 109 mmol/L (ref 98–111)
Creatinine, Ser: 0.96 mg/dL (ref 0.61–1.24)
GFR, Estimated: >60 mL/min (ref >60)
Glucose, Bld: 95 mg/dL (ref 70–99)
Potassium: 3.8 mmol/L (ref 3.5–5.1)
Sodium: 136 mmol/L (ref 135–145)

## 2022-03-13 MED ORDER — SUCRALFATE 1 GM/10ML PO SUSP: 1.0000 g | Freq: Three times a day (TID) | ORAL | 0 refills | Status: DC

## 2022-03-13 MED ORDER — PANTOPRAZOLE SODIUM 40 MG PO TBEC: 40.0000 mg | DELAYED_RELEASE_TABLET | Freq: Two times a day (BID) | ORAL | 2 refills | Status: DC

## 2022-03-13 NOTE — Progress Notes (Signed)
Subjective: ?Patient doing well today.  Tolerating diet.  Hemoglobin stable.  No abdominal pain. ? ?Objective: ?Vital signs in last 24 hours: ?Temp:  [97.9 ?F (36.6 ?C)-98.7 ?F (37.1 ?C)] 98.5 ?F (36.9 ?C) (04/07 0825) ?Pulse Rate:  [76-90] 82 (04/07 0825) ?Resp:  [12-30] 14 (04/07 0825) ?BP: (114-132)/(50-82) 124/82 (04/07 0825) ?SpO2:  [95 %-100 %] 100 % (04/07 0825) ?Weight:  [66.7 kg] 66.7 kg (04/07 0413) ?Last BM Date : 03/12/22 ?General:   Alert and oriented, pleasant ?Head:  Normocephalic and atraumatic. ?Eyes:  No icterus, sclera clear. Conjuctiva pink.  ?Abdomen:  Bowel sounds present, soft, non-tender, non-distended. No HSM or hernias noted. No rebound or guarding. No masses appreciated  ?Msk:  Symmetrical without gross deformities. Normal posture. ?Extremities:  Without clubbing or edema. ?Neurologic:  Alert and  oriented x4;  grossly normal neurologically. ?Skin:  Warm and dry, intact without significant lesions.  ?Cervical Nodes:  No significant cervical adenopathy. ?Psych:  Alert and cooperative. Normal mood and affect. ? ?Intake/Output from previous day: ?04/06 0701 - 04/07 0700 ?In: 243.5 [I.V.:243.5] ?Out: 975 [Urine:975] ?Intake/Output this shift: ?Total I/O ?In: 105 [I.V.:105] ?Out: -  ? ?Lab Results: ?Recent Labs  ?  03/11/22 ?0405 03/12/22 ?0409 03/13/22 ?0406  ?WBC 10.8* 6.6 6.9  ?HGB 7.8* 7.9* 7.6*  ?HCT 25.4* 24.3* 24.5*  ?PLT 258 264 284  ? ?BMET ?Recent Labs  ?  03/11/22 ?0405 03/12/22 ?0409 03/13/22 ?0406  ?NA 138 137 136  ?K 3.3* 3.5 3.8  ?CL 109 110 109  ?CO2 23 21* 21*  ?GLUCOSE 103* 96 95  ?BUN 10 5* 6  ?CREATININE 0.92 0.83 0.96  ?CALCIUM 7.9* 8.1* 8.4*  ? ?LFT ?Recent Labs  ?  03/11/22 ?0405 03/12/22 ?0409  ?PROT 4.9* 4.9*  ?ALBUMIN 2.5* 2.4*  ?AST 17 16  ?ALT 30 25  ?ALKPHOS 110 102  ?BILITOT 2.1* 1.9*  ? ?PT/INR ?No results for input(s): LABPROT, INR in the last 72 hours. ?Hepatitis Panel ?No results for input(s): HEPBSAG, HCVAB, HEPAIGM, HEPBIGM in the last 72  hours. ? ? ?Studies/Results: ?No results found. ? ?Assessment: ?*Acute upper GI bleed ?*Peptic ulcer versus malignant lesion in duodenum ?*Abdominal pain-resolved ? ?Plan: ?Okay to discharge from GI standpoint today. ? ?Continue on pantoprazole 40 mg twice daily.  Has completed 72 hours of PPI infusion.  Carafate 4 times a day as needed for abdominal discomfort. ? ?Case was discussed with Dr. Ardis Hughs of advanced endoscopy.  Recommend repeat CT scan in 6 weeks which we will arrange.  Repeat EGD in 8 to 10 weeks pending clinical course. ? ?Keep diet soft. ? ?Patient would like to establish GI care here locally.  I will arrange for hospital follow-up visit. ? ?Repeat CBC in 1 week.  Avoid all NSAIDs. ? ?Elon Alas. Abbey Chatters, D.O. ?Gastroenterology and Hepatology ?[facility] ? ? LOS: 4 days  ? ? 03/13/2022, 9:37 AM ? ? ?

## 2022-03-13 NOTE — Telephone Encounter (Signed)
Patient wishes to establish GI care here locally.  Previously seen at [facility].  Erline Levine can we arrange hospital follow-up with an app or myself in 3 to 4 weeks if possible. ? ?Mindy/Martina can we order CT pancreas protocol with contrast to be performed in 6 weeks? ? ?Thank you ?

## 2022-03-13 NOTE — Plan of Care (Signed)
Patient remains on protonix infusion. "Telemetry" level of care but remains in SDU/ICU. ? ? ?Problem: Education: ?Goal: Knowledge of General Education information will improve ?Description: Including pain rating scale, medication(s)/side effects and non-pharmacologic comfort measures ?Outcome: Progressing ?  ?Problem: Health Behavior/Discharge Planning: ?Goal: Ability to manage health-related needs will improve ?Outcome: Progressing ?  ?Problem: Clinical Measurements: ?Goal: Ability to maintain clinical measurements within normal limits will improve ?Outcome: Progressing ?Goal: Will remain free from infection ?Outcome: Progressing ?Goal: Diagnostic test results will improve ?Outcome: Progressing ?Goal: Respiratory complications will improve ?Outcome: Progressing ?Goal: Cardiovascular complication will be avoided ?Outcome: Progressing ?  ?Problem: Activity: ?Goal: Risk for activity intolerance will decrease ?Outcome: Progressing ?  ?Problem: Nutrition: ?Goal: Adequate nutrition will be maintained ?Outcome: Progressing ?  ?Problem: Coping: ?Goal: Level of anxiety will decrease ?Outcome: Progressing ?  ?Problem: Elimination: ?Goal: Will not experience complications related to bowel motility ?Outcome: Progressing ?Goal: Will not experience complications related to urinary retention ?Outcome: Progressing ?  ?Problem: Pain Managment: ?Goal: General experience of comfort will improve ?Outcome: Progressing ?  ?Problem: Safety: ?Goal: Ability to remain free from injury will improve ?Outcome: Progressing ?  ?Problem: Skin Integrity: ?Goal: Risk for impaired skin integrity will decrease ?Outcome: Progressing ?  ?

## 2022-03-13 NOTE — Plan of Care (Signed)
Patient discharged to home this AM. Patient to schedule f/u appointments with PCP and with GI for this coming week. This RN discussed the discharge plan of care with the patient and one of his sisters. Emphasis placed on smoking cessation. This RN described to the patient his enhanced risk for CVA due to smoking and discussed how his recent GIB may limit treatment options if were to have a stroke in the future. Patient denies questions or concerns at this time and leaves the unit in no obvious distress. Patient's sister to provide transportation home. ? ? ?Problem: Education: ?Goal: Knowledge of General Education information will improve ?Description: Including pain rating scale, medication(s)/side effects and non-pharmacologic comfort measures ?03/13/2022 1022 by Jesse Sans, RN ?Outcome: Adequate for Discharge ?03/13/2022 0835 by Jesse Sans, RN ?Outcome: Progressing ?  ?Problem: Health Behavior/Discharge Planning: ?Goal: Ability to manage health-related needs will improve ?03/13/2022 1022 by Jesse Sans, RN ?Outcome: Adequate for Discharge ?03/13/2022 0835 by Jesse Sans, RN ?Outcome: Progressing ?  ?Problem: Clinical Measurements: ?Goal: Ability to maintain clinical measurements within normal limits will improve ?03/13/2022 1022 by Jesse Sans, RN ?Outcome: Adequate for Discharge ?03/13/2022 0835 by Jesse Sans, RN ?Outcome: Progressing ?Goal: Will remain free from infection ?03/13/2022 1022 by Jesse Sans, RN ?Outcome: Adequate for Discharge ?03/13/2022 0835 by Jesse Sans, RN ?Outcome: Progressing ?Goal: Diagnostic test results will improve ?03/13/2022 1022 by Jesse Sans, RN ?Outcome: Adequate for Discharge ?03/13/2022 0835 by Jesse Sans, RN ?Outcome: Progressing ?Goal: Respiratory complications will improve ?03/13/2022 1022 by Jesse Sans, RN ?Outcome: Adequate for Discharge ?03/13/2022 0835 by Jesse Sans, RN ?Outcome: Progressing ?Goal: Cardiovascular complication will be  avoided ?03/13/2022 1022 by Jesse Sans, RN ?Outcome: Adequate for Discharge ?03/13/2022 0835 by Jesse Sans, RN ?Outcome: Progressing ?  ?Problem: Activity: ?Goal: Risk for activity intolerance will decrease ?03/13/2022 1022 by Jesse Sans, RN ?Outcome: Adequate for Discharge ?03/13/2022 0835 by Jesse Sans, RN ?Outcome: Progressing ?  ?Problem: Nutrition: ?Goal: Adequate nutrition will be maintained ?03/13/2022 1022 by Jesse Sans, RN ?Outcome: Adequate for Discharge ?03/13/2022 0835 by Jesse Sans, RN ?Outcome: Progressing ?  ?Problem: Coping: ?Goal: Level of anxiety will decrease ?03/13/2022 1022 by Jesse Sans, RN ?Outcome: Adequate for Discharge ?03/13/2022 0835 by Jesse Sans, RN ?Outcome: Progressing ?  ?Problem: Elimination: ?Goal: Will not experience complications related to bowel motility ?03/13/2022 1022 by Jesse Sans, RN ?Outcome: Adequate for Discharge ?03/13/2022 0835 by Jesse Sans, RN ?Outcome: Progressing ?Goal: Will not experience complications related to urinary retention ?03/13/2022 1022 by Jesse Sans, RN ?Outcome: Adequate for Discharge ?03/13/2022 0835 by Jesse Sans, RN ?Outcome: Progressing ?  ?Problem: Pain Managment: ?Goal: General experience of comfort will improve ?03/13/2022 1022 by Jesse Sans, RN ?Outcome: Adequate for Discharge ?03/13/2022 0835 by Jesse Sans, RN ?Outcome: Progressing ?  ?Problem: Safety: ?Goal: Ability to remain free from injury will improve ?03/13/2022 1022 by Jesse Sans, RN ?Outcome: Adequate for Discharge ?03/13/2022 0835 by Jesse Sans, RN ?Outcome: Progressing ?  ?Problem: Skin Integrity: ?Goal: Risk for impaired skin integrity will decrease ?03/13/2022 1022 by Jesse Sans, RN ?Outcome: Adequate for Discharge ?03/13/2022 0835 by Jesse Sans, RN ?Outcome: Progressing ?  ?

## 2022-03-13 NOTE — TOC Transition Note (Signed)
Transition of Care (TOC) - CM/SW Discharge Note ? ? ?Patient Details  ?Name: Wesley Francis ?MRN: [id number] ?Date of Birth: Oct 30, 1966 ? ?Transition of Care (TOC) CM/SW Contact:  ?Boneta Lucks, RN ?Phone Number: ?03/13/2022, 12:09 PM ? ? ?Clinical Narrative:   [facility] can not accept referral. Patient to follow up with PCP. ?  ?Barriers to Discharge: No Home Care Agency will accept this patient ? ?

## 2022-03-13 NOTE — Discharge Summary (Signed)
Physician Discharge Summary  ?PHOENIX DRESSER MGQ:[id number] DOB: [birth year] DOA: 03/09/2022 ? ?PCP: Jerel Shepherd, FNP ? ?Admit date: 03/09/2022 ? ?Discharge date: 03/13/2022 ? ?Admitted From:Home ? ?Disposition:  Home ? ?Recommendations for Outpatient Follow-up:  ?Follow up with PCP in 1-2 weeks ?Remain on PPI BID as prescribed ?Continue on Carafate 4 times daily as needed ?Continue other medications as noted below ?GI planning to follow-up CT scan in 6 weeks as well as EGD in 8-10 weeks, he will be scheduled for these imaging studies ? ?Home Health: None ? ?Equipment/Devices: None ? ?Discharge Condition:Stable ? ?CODE STATUS: Full ? ?Diet recommendation: Heart Healthy ? ?Brief/Interim Summary: ?Wesley Francis is a 56 year old male with extensive history of HTN, chronic alcohol abuse, liver cirrhosis, arthritis, asthma, iron deficiency, chronic tobacco abuse, seasonal allergies presenting with acute rectal bleed. ?  ?Patient reports his initial bowel movement in the morning was nonbloody, subsequently noticed frank bright red blood per rectum, according to the patient was massive. ?  ?He admits that he is drinking alcohol only on the weekend. But he has history of heavy drinking drinking for over 20 yrs..  Possible having liver cirrhosis now... Was referred to a liver specialist at [facility] - he was seen on 03/05/2022, his  lab was checked subsequently noted for hemoglobin of 6.0, he was sent to the ED there and was transfused with 2U PRBC. ?  ?He reports of no previous history of GI bleed.  Last EGD, colonoscopy was in 2021, EGD was normal with no report of esophageal varices, colonoscopy reported external hemorrhoid again no evidence of varices at the time. ?  ?-He has undergone EGD 4/4 with findings of nonbleeding deep duodenal ulcer and a rounded circumferential mass at the base of the ulcer.  Multiple biopsies have been taken with no findings of malignancy noted as of yet.  He has otherwise been doing  well throughout the course of his hospitalization and has remained asymptomatic.  He has completed 72-hour PPI infusion and GI recommends discharge on PPI twice daily as well as Carafate 4 times daily with recommendations for CT scan in 6 weeks and EGD in 8-10 weeks which will be arranged.  No other acute events noted throughout the course of this hospitalization. ? ?Discharge Diagnoses:  ?Principal Problem: ?  GIB (gastrointestinal bleeding) ?Active Problems: ?  Leukocytosis ?  Hypotension ?  Alcohol abuse ?  HTN (hypertension) ?  Alcoholic cirrhosis of liver with ascites ([place]) ?  Hypokalemia ?  Tobacco abuse ?  Iron deficiency anemia due to chronic blood loss ?  Pneumobilia ? ?Principal discharge diagnosis: Acute GI bleed secondary to duodenal ulcer-not actively bleeding at time of EGD.  ? ?Discharge Instructions ? ?Discharge Instructions   ? ? Diet - low sodium heart healthy   Complete by: As directed ?  ? Increase activity slowly   Complete by: As directed ?  ? ?  ? ?Allergies as of 03/13/2022   ?No Known Allergies ?  ? ?  ?Medication List  ?  ? ?STOP taking these medications   ? ?ibuprofen 600 MG tablet ?Commonly known as: ADVIL ?  ?metoprolol succinate 50 MG 24 hr tablet ?Commonly known as: TOPROL-XL ?  ?nicotine 21 mg/24hr patch ?Commonly known as: NICODERM CQ - dosed in mg/24 hours ?  ?potassium chloride SA 20 MEQ tablet ?Commonly known as: KLOR-CON M ?  ? ?  ? ?TAKE these medications   ? ?acetaminophen 325 MG tablet ?Commonly known as: TYLENOL ?  Take 650 mg by mouth every 6 (six) hours as needed for mild pain. ?  ?albuterol 108 (90 Base) MCG/ACT inhaler ?Commonly known as: VENTOLIN HFA ?Inhale 2 puffs into the lungs every 6 (six) hours as needed for wheezing or shortness of breath. ?  ?metoCLOPramide 10 MG tablet ?Commonly known as: Reglan ?Take 1 tablet (10 mg total) by mouth 3 (three) times daily as needed (hiccups). ?  ?montelukast 10 MG tablet ?Commonly known as: SINGULAIR ?Take 10 mg by mouth daily. ?   ?pantoprazole 40 MG tablet ?Commonly known as: Protonix ?Take 1 tablet (40 mg total) by mouth 2 (two) times daily. ?What changed: when to take this ?  ?sucralfate 1 GM/10ML suspension ?Commonly known as: CARAFATE ?Take 10 mLs (1 g total) by mouth 4 (four) times daily -  with meals and at bedtime. ?  ?thiamine 100 MG tablet ?Take 1 tablet (100 mg total) by mouth daily. ?  ?Vitamin D 50 MCG (2000 UT) Caps ?Take 1 capsule by mouth every other day. ?  ? ?  ? ? Follow-up Information   ? ? Cobb, Bunnie Pion, FNP. Schedule an appointment as soon as possible for a visit in 1 week(s).   ?Specialty: Family Medicine ?Contact information: ?[place] [address] ?[phone] ? ? ?  ?  ? ? [facility]. Schedule an appointment as soon as possible for a visit.   ?Contact information: ?[address] ?[place] ?[phone] ? ?  ?  ? ?  ?  ? ?  ? ?No Known Allergies ? ?Consultations: ?GI ? ? ?Procedures/Studies: ?CT ABDOMEN WO CONTRAST ? ?Result Date: 03/09/2022 ?CLINICAL DATA:  Acute abdominal pain. Rectal bleeding. Questionable duodenal perforation. EXAM: CT ABDOMEN WITHOUT CONTRAST TECHNIQUE: Multidetector CT imaging of the abdomen was performed following the standard protocol without IV contrast. RADIATION DOSE REDUCTION: This exam was performed according to the departmental dose-optimization program which includes automated exposure control, adjustment of the mA and/or kV according to patient size and/or use of iterative reconstruction technique. COMPARISON:  CTA abdomen and pelvis 03/09/2022. FINDINGS: Lower chest: There is atelectasis or scarring in the lung bases. Hepatobiliary: Again seen is air within the gallbladder, common bile duct and left intrahepatic ducts similar to the prior study. No gallstones are identified. Liver otherwise appears within normal limits. Pancreas: Enlargement of the pancreatic head with surrounding inflammation  is similar to the prior study. There is air within the pancreatic head, also unchanged. Pancreatic body and tail are within normal limits. Spleen: Normal in size without focal abnormality. Adrenals/Urinary Tract: There is a 15 mm cyst in the superior pole the right kidney. Additional hypodensity in the left kidney is too small to characterize, but also likely a cyst. There is no hydronephrosis. Stomach/Bowel: Oral contrast is seen throughout the stomach and mid small bowel loops. Homogeneous density noted within the stomach lumen measuring 7.3 x 9.2 x 4.1 cm which may represent hematoma. Stomach is nondilated. There is no extravasation of oral contrast in the region of the duodenum. Duodenal wall thickening and inflammation versus there is no free air in this region. Visualized small bowel loops are otherwise within normal limits. Vascular/Lymphatic: No significant vascular findings are present. No enlarged abdominal or pelvic lymph nodes. Other: No ascites or free air.  No focal abdominal wall hernia. Musculoskeletal: No acute or significant osseous findings. IMPRESSION: 1. Again seen is inflammation and wall thickening of the duodenum with enlargement of the pancreatic head. Central  air in the pancreatic head is unchanged. There is no extravasation of oral contrast or free air to indicate bowel perforation. Oral contrast reaches mid small bowel. 2. Persistent pneumobilia of uncertain etiology. 3. Dense area in the stomach lumen may represent hematoma. Electronically Signed   By: Ronney Asters M.D.   On: 03/09/2022 16:53  ? ?DG Chest 2 View ? ?Result Date: 02/15/2022 ?CLINICAL DATA:  Hiccups for 3 days, intractable, history asthma, hypertension, ethanol abuse, smoker EXAM: CHEST - 2 VIEW COMPARISON:  12/20/2017 FINDINGS: Normal heart size, mediastinal contours, and pulmonary vascularity. Linear scarring in lingula. Emphysematous changes without pulmonary infiltrate, pleural effusion, or pneumothorax. Osseous  structures unremarkable. IMPRESSION: Emphysematous changes with minimal linear scarring in lingula. No acute abnormalities. Emphysema (ICD10-J43.9). Electronically Signed   By: Lavonia Dana M.D.   On: 02/15/2022 19:

## 2022-03-14 ENCOUNTER — Other Ambulatory Visit: Payer: Self-pay

## 2022-03-14 ENCOUNTER — Encounter (HOSPITAL_COMMUNITY): Payer: Self-pay

## 2022-03-14 ENCOUNTER — Inpatient Hospital Stay (HOSPITAL_COMMUNITY)
Admission: EM | Admit: 2022-03-14 | Discharge: 2022-03-20 | DRG: 270 | Disposition: A | Discharge status: Home / Self Care | Payer: Medicaid Other | Attending: Internal Medicine | Admitting: Internal Medicine

## 2022-03-14 DIAGNOSIS — K922 Gastrointestinal hemorrhage, unspecified: Secondary | ICD-10-CM | POA: Diagnosis not present

## 2022-03-14 DIAGNOSIS — K269 Duodenal ulcer, unspecified as acute or chronic, without hemorrhage or perforation: Secondary | ICD-10-CM | POA: Diagnosis not present

## 2022-03-14 DIAGNOSIS — K625 Hemorrhage of anus and rectum: Principal | ICD-10-CM

## 2022-03-14 DIAGNOSIS — I82611 Acute embolism and thrombosis of superficial veins of right upper extremity: Secondary | ICD-10-CM | POA: Diagnosis present

## 2022-03-14 DIAGNOSIS — Z720 Tobacco use: Secondary | ICD-10-CM | POA: Diagnosis present

## 2022-03-14 DIAGNOSIS — D62 Acute posthemorrhagic anemia: Secondary | ICD-10-CM | POA: Diagnosis present

## 2022-03-14 DIAGNOSIS — Z8249 Family history of ischemic heart disease and other diseases of the circulatory system: Secondary | ICD-10-CM | POA: Diagnosis not present

## 2022-03-14 DIAGNOSIS — Z8673 Personal history of transient ischemic attack (TIA), and cerebral infarction without residual deficits: Secondary | ICD-10-CM | POA: Diagnosis not present

## 2022-03-14 DIAGNOSIS — F17210 Nicotine dependence, cigarettes, uncomplicated: Secondary | ICD-10-CM | POA: Diagnosis present

## 2022-03-14 DIAGNOSIS — D649 Anemia, unspecified: Secondary | ICD-10-CM | POA: Diagnosis present

## 2022-03-14 DIAGNOSIS — Z79899 Other long term (current) drug therapy: Secondary | ICD-10-CM | POA: Diagnosis not present

## 2022-03-14 DIAGNOSIS — I728 Aneurysm of other specified arteries: Principal | ICD-10-CM | POA: Diagnosis present

## 2022-03-14 DIAGNOSIS — I422 Other hypertrophic cardiomyopathy: Secondary | ICD-10-CM | POA: Diagnosis present

## 2022-03-14 DIAGNOSIS — R55 Syncope and collapse: Secondary | ICD-10-CM | POA: Diagnosis not present

## 2022-03-14 DIAGNOSIS — R571 Hypovolemic shock: Secondary | ICD-10-CM | POA: Diagnosis present

## 2022-03-14 DIAGNOSIS — E872 Acidosis, unspecified: Secondary | ICD-10-CM | POA: Diagnosis present

## 2022-03-14 DIAGNOSIS — M79601 Pain in right arm: Secondary | ICD-10-CM | POA: Diagnosis not present

## 2022-03-14 DIAGNOSIS — R578 Other shock: Secondary | ICD-10-CM | POA: Diagnosis not present

## 2022-03-14 DIAGNOSIS — K7030 Alcoholic cirrhosis of liver without ascites: Secondary | ICD-10-CM | POA: Diagnosis present

## 2022-03-14 DIAGNOSIS — F1010 Alcohol abuse, uncomplicated: Secondary | ICD-10-CM | POA: Diagnosis present

## 2022-03-14 DIAGNOSIS — Z823 Family history of stroke: Secondary | ICD-10-CM | POA: Diagnosis not present

## 2022-03-14 DIAGNOSIS — J45909 Unspecified asthma, uncomplicated: Secondary | ICD-10-CM | POA: Diagnosis present

## 2022-03-14 DIAGNOSIS — K7010 Alcoholic hepatitis without ascites: Secondary | ICD-10-CM | POA: Diagnosis present

## 2022-03-14 DIAGNOSIS — I10 Essential (primary) hypertension: Secondary | ICD-10-CM | POA: Diagnosis present

## 2022-03-14 DIAGNOSIS — M7989 Other specified soft tissue disorders: Secondary | ICD-10-CM | POA: Diagnosis not present

## 2022-03-14 DIAGNOSIS — K279 Peptic ulcer, site unspecified, unspecified as acute or chronic, without hemorrhage or perforation: Secondary | ICD-10-CM | POA: Diagnosis not present

## 2022-03-14 DIAGNOSIS — R748 Abnormal levels of other serum enzymes: Secondary | ICD-10-CM | POA: Diagnosis present

## 2022-03-14 DIAGNOSIS — E861 Hypovolemia: Secondary | ICD-10-CM | POA: Diagnosis present

## 2022-03-14 DIAGNOSIS — K264 Chronic or unspecified duodenal ulcer with hemorrhage: Secondary | ICD-10-CM | POA: Diagnosis present

## 2022-03-14 DIAGNOSIS — R404 Transient alteration of awareness: Secondary | ICD-10-CM | POA: Diagnosis not present

## 2022-03-14 DIAGNOSIS — R569 Unspecified convulsions: Secondary | ICD-10-CM | POA: Diagnosis present

## 2022-03-14 LAB — CBC WITH DIFFERENTIAL/PLATELET
Abs Immature Granulocytes: 0.05 10*3/uL (ref 0.00–0.07)
Abs Immature Granulocytes: 0.02 10*3/uL (ref 0.00–0.07)
Basophils Absolute: 0 10*3/uL (ref 0.0–0.1)
Basophils Absolute: 0 10*3/uL (ref 0.0–0.1)
Basophils Relative: 0 %
Basophils Relative: 0 %
Eosinophils Absolute: 0.1 10*3/uL (ref 0.0–0.5)
Eosinophils Absolute: 0.1 10*3/uL (ref 0.0–0.5)
Eosinophils Relative: 1 %
Eosinophils Relative: 1 %
HCT: 25 % — ABNORMAL LOW (ref 39.0–52.0)
HCT: 22.3 % — ABNORMAL LOW (ref 39.0–52.0)
Hemoglobin: 7.5 g/dL — ABNORMAL LOW (ref 13.0–17.0)
Hemoglobin: 7.1 g/dL — ABNORMAL LOW (ref 13.0–17.0)
Immature Granulocytes: 1 %
Immature Granulocytes: 0 %
Lymphocytes Relative: 10 %
Lymphocytes Relative: 19 %
Lymphs Abs: 1 10*3/uL (ref 0.7–4.0)
Lymphs Abs: 1.6 10*3/uL (ref 0.7–4.0)
MCH: 27.7 pg (ref 26.0–34.0)
MCH: 28.9 pg (ref 26.0–34.0)
MCHC: 30 g/dL (ref 30.0–36.0)
MCHC: 31.8 g/dL (ref 30.0–36.0)
MCV: 92.3 fL (ref 80.0–100.0)
MCV: 90.7 fL (ref 80.0–100.0)
Monocytes Absolute: 0.7 10*3/uL (ref 0.1–1.0)
Monocytes Absolute: 0.9 10*3/uL (ref 0.1–1.0)
Monocytes Relative: 7 %
Monocytes Relative: 10 %
Neutro Abs: 7.8 10*3/uL — ABNORMAL HIGH (ref 1.7–7.7)
Neutro Abs: 5.7 10*3/uL (ref 1.7–7.7)
Neutrophils Relative %: 81 %
Neutrophils Relative %: 70 %
Platelets: 324 10*3/uL (ref 150–400)
Platelets: 315 10*3/uL (ref 150–400)
RBC: 2.71 MIL/uL — ABNORMAL LOW (ref 4.22–5.81)
RBC: 2.46 MIL/uL — ABNORMAL LOW (ref 4.22–5.81)
RDW: 15.3 % (ref 11.5–15.5)
RDW: 15.1 % (ref 11.5–15.5)
WBC: 9.5 10*3/uL (ref 4.0–10.5)
WBC: 8.3 10*3/uL (ref 4.0–10.5)
nRBC: 0 % (ref 0.0–0.2)
nRBC: 0 % (ref 0.0–0.2)

## 2022-03-14 LAB — COMPREHENSIVE METABOLIC PANEL
ALT: 33 U/L (ref 0–44)
AST: 31 U/L (ref 15–41)
Albumin: 3.1 g/dL — ABNORMAL LOW (ref 3.5–5.0)
Alkaline Phosphatase: 225 U/L — ABNORMAL HIGH (ref 38–126)
Anion gap: 10 (ref 5–15)
BUN: 24 mg/dL — ABNORMAL HIGH (ref 6–20)
CO2: 21 mmol/L — ABNORMAL LOW (ref 22–32)
Calcium: 9 mg/dL (ref 8.9–10.3)
Chloride: 106 mmol/L (ref 98–111)
Creatinine, Ser: 1.05 mg/dL (ref 0.61–1.24)
GFR, Estimated: >60 mL/min (ref >60)
Glucose, Bld: 91 mg/dL (ref 70–99)
Potassium: 4.5 mmol/L (ref 3.5–5.1)
Sodium: 137 mmol/L (ref 135–145)
Total Bilirubin: 1.9 mg/dL — ABNORMAL HIGH (ref 0.3–1.2)
Total Protein: 6.3 g/dL — ABNORMAL LOW (ref 6.5–8.1)

## 2022-03-14 LAB — PREPARE RBC (CROSSMATCH)

## 2022-03-14 LAB — PROTIME-INR
INR: 1.1 (ref 0.8–1.2)
Prothrombin Time: 13.7 seconds (ref 11.4–15.2)

## 2022-03-14 MED ORDER — SODIUM CHLORIDE 0.9 % IV SOLN
10.0000 mL/h | Freq: Once | INTRAVENOUS | Status: AC
  Administered 2022-03-14: 10 mL/h via INTRAVENOUS

## 2022-03-14 MED ORDER — CHLORHEXIDINE GLUCONATE CLOTH 2 % EX PADS
6.0000 | MEDICATED_PAD | Freq: Every day | CUTANEOUS | Status: DC
  Administered 2022-03-15 – 2022-03-19 (×5): 6 via TOPICAL

## 2022-03-14 MED ORDER — SODIUM CHLORIDE 0.9 % IV BOLUS
1000.0000 mL | Freq: Once | INTRAVENOUS | Status: AC
  Administered 2022-03-14: 1000 mL via INTRAVENOUS

## 2022-03-14 MED ORDER — SUCRALFATE 1 GM/10ML PO SUSP
1.0000 g | Freq: Three times a day (TID) | ORAL | Status: DC
  Administered 2022-03-14 – 2022-03-20 (×21): 1 g via ORAL
  Filled 2022-03-14 (×21): qty 10

## 2022-03-14 MED ORDER — THIAMINE HCL 100 MG PO TABS
100.0000 mg | ORAL_TABLET | Freq: Every day | ORAL | Status: DC
  Administered 2022-03-14 – 2022-03-20 (×6): 100 mg via ORAL
  Filled 2022-03-14 (×6): qty 1

## 2022-03-14 MED ORDER — PANTOPRAZOLE SODIUM 40 MG PO TBEC
40.0000 mg | DELAYED_RELEASE_TABLET | Freq: Two times a day (BID) | ORAL | Status: DC
  Administered 2022-03-14 (×2): 40 mg via ORAL
  Filled 2022-03-14 (×2): qty 1

## 2022-03-14 MED ORDER — NICOTINE 21 MG/24HR TD PT24
21.0000 mg | MEDICATED_PATCH | Freq: Every day | TRANSDERMAL | Status: DC
  Administered 2022-03-15 – 2022-03-19 (×6): 21 mg via TRANSDERMAL
  Filled 2022-03-14 (×6): qty 1

## 2022-03-14 MED ORDER — SODIUM CHLORIDE 0.9 % IV BOLUS
500.0000 mL | Freq: Once | INTRAVENOUS | Status: AC
  Administered 2022-03-14: 500 mL via INTRAVENOUS

## 2022-03-14 NOTE — ED Notes (Signed)
Pt requested to use the restroom. Upon standing, pt became hypotensive and had a complete syncopal episode. BP dropped to 70's and nurse had to sternal rub pt to arouse him. This nurse began fluids at bolus rate, another RN went to make MD and hospitalist aware. MD placed new orders for more fluids and 2 more units of blood.  ?3 RN's and 2 NT's cleaned pt, changed linens and gown.  ?Pt comfortable and resting at this time. Vital signs WDL.  ?

## 2022-03-14 NOTE — ED Notes (Signed)
Peri care performed, clean brief and linens. ?

## 2022-03-14 NOTE — ED Triage Notes (Signed)
Patient states he has been passing dark/light blood for about one week. States he was discharged yesterday from here. Still concerned  ?

## 2022-03-14 NOTE — Consult Note (Signed)
Initial Consultation Note ? ? ?Patient: Wesley Francis DJM:[id number] DOB: Feb 10, 1966 PCP: Jerel Shepherd, FNP ?DOA: 03/14/2022 ?DOS: the patient was seen and examined on 03/14/2022 ? ?Referring physician: Eulis Foster ?Reason for consult: Anemia ? ?Assessment/Plan: ?Wesley Francis is a 56 year old male with extensive history of HTN, chronic alcohol abuse, liver cirrhosis, arthritis, asthma, iron deficiency, chronic tobacco abuse, seasonal allergies who presents with ongoing dark maroon stool. Patient was just discharged yesterday after work-up with GI, hospitalist team, and surgery.  EGD 03/10/2022 showed nonbleeding deep duodenal ulcer with circumferential mass at the base concerning for malignancy, biopsies are currently pending. Patient discharged yesterday with a hemoglobin of 7.6 and stable, presents today with questionable orthostatic symptoms and ongoing maroon stool with a hemoglobin initially of 7.5, after 1L of fluid patient's repeat hemoglobin is 7.1. ? ?Discussed case with GI on-call, agree that blood transfusion of 1 unit PRBC is reasonable, plan would still be for discharge after transfusion and outpatient follow-up in their office pending biopsy results. There is no indication currently to repeat EGD or for further endoscopy, imaging, or intervention at this time. GI indicated patient will continue to have dark maroon stool washout from previous bleed. Certainly bright red blood per rectum would be more worrisome. Would recommend repeat labs in the next 1 to 2 weeks if ongoing maroon stool does not clear with PCP in the outpatient setting.  Patient has scheduled appointment with PCP for 03/16/2022, recommend keeping this appointment and follow labs if indicated at that time. ? ?Continue all previous medications as prescribed including twice daily PPI, Carafate and plan for repeat imaging and endoscopy in the next 1 to 2 months. ? ? ?TRH will sign off at present, please call us again when needed. ? ? ?Review of  Systems: As mentioned in the history of present illness. All other systems reviewed and are negative. ?Past Medical History:  ?Diagnosis Date  ? AKI (acute kidney injury) ([place]) 04/04/2016  ? Anemia   ? Arthritis   ? Asthma   ? ETOH abuse   ? Folate deficiency 04/05/2016  ? Heme positive stool 04/05/2016  ? Hypertension   ? ?Past Surgical History:  ?Procedure Laterality Date  ? BIOPSY  03/10/2022  ? Procedure: BIOPSY;  Surgeon: Harvel Quale, MD;  [facility];  Service: Gastroenterology;;  ? COLONOSCOPY    ? COLONOSCOPY WITH PROPOFOL N/A 03/02/2017  ? Procedure: COLONOSCOPY WITH PROPOFOL;  Surgeon: Lollie Sails, MD;  Location: [facility] ENDOSCOPY;  Service: Endoscopy;  Laterality: N/A;  ? COLONOSCOPY WITH PROPOFOL N/A 07/08/2020  ? Procedure: COLONOSCOPY WITH PROPOFOL;  Surgeon: Lesly Rubenstein, MD;  Location: [facility] ENDOSCOPY;  Service: Endoscopy;  Laterality: N/A;  ? ESOPHAGOGASTRODUODENOSCOPY (EGD) WITH PROPOFOL N/A 07/08/2020  ? Procedure: ESOPHAGOGASTRODUODENOSCOPY (EGD) WITH PROPOFOL;  Surgeon: Lesly Rubenstein, MD;  Location: [facility] ENDOSCOPY;  Service: Endoscopy;  Laterality: N/A;  ? ESOPHAGOGASTRODUODENOSCOPY (EGD) WITH PROPOFOL N/A 03/10/2022  ? Procedure: ESOPHAGOGASTRODUODENOSCOPY (EGD) WITH PROPOFOL;  Surgeon: Harvel Quale, MD;  Location: [facility];  Service: Gastroenterology;  Laterality: N/A;  ? FRACTURE SURGERY    ? KNEE ARTHROCENTESIS    ? ?Social History:  reports that he has been smoking cigarettes. He has been smoking an average of .5 packs per day. He has never used smokeless tobacco. He reports current alcohol use of about 4.0 standard drinks per week. He reports that he does not use drugs. ? ?No Known Allergies ? ?Family History  ?Problem Relation Age of Onset  ?  Hypertension Mother   ? Stroke Mother   ? Hypertension Father   ? ? ?Prior to Admission medications   ?Medication Sig Start Date End Date Taking? Authorizing Provider  ?acetaminophen (TYLENOL) 325 MG  tablet Take 650 mg by mouth every 6 (six) hours as needed for mild pain.    [provider]  ?albuterol (PROVENTIL HFA;VENTOLIN HFA) 108 (90 Base) MCG/ACT inhaler Inhale 2 puffs into the lungs every 6 (six) hours as needed for wheezing or shortness of breath. 04/06/16   Rexene Alberts, MD  ?Cholecalciferol (VITAMIN D) 2000 units CAPS Take 1 capsule by mouth every other day.    [provider]  ?metoCLOPramide (REGLAN) 10 MG tablet Take 1 tablet (10 mg total) by mouth 3 (three) times daily as needed (hiccups). 02/15/22   Margarita Mail, PA-C  ?montelukast (SINGULAIR) 10 MG tablet Take 10 mg by mouth daily.    [provider]  ?pantoprazole (PROTONIX) 40 MG tablet Take 1 tablet (40 mg total) by mouth 2 (two) times daily. 03/13/22 04/12/22  Manuella Ghazi, Pratik D, DO  ?sucralfate (CARAFATE) 1 GM/10ML suspension Take 10 mLs (1 g total) by mouth 4 (four) times daily -  with meals and at bedtime. 03/13/22   Manuella Ghazi, Pratik D, DO  ?thiamine 100 MG tablet Take 1 tablet (100 mg total) by mouth daily. 12/23/17   Heath Lark D, DO  ? ?Physical Exam: ?Vitals:  ? 03/14/22 1200 03/14/22 1230 03/14/22 1300 03/14/22 1400  ?BP: 135/76 123/79 119/81 110/87  ?Pulse:  77 80 81  ?Resp:  '17 16 16  '$ ?Temp:      ?TempSrc:      ?SpO2:  100% 100% 100%  ?Weight:      ?Height:      ? ?Data Reviewed:  ? ?Family Communication: Sister is at bedside ?Primary team communication: Discussed with Dr. Eulis Foster and Abbey Chatters ? ?Thank you very much for involving [place] in the care of your patient. ? ?Author: Holli Humbles DO ?03/14/2022 3:41 PM ? ?For on call review [email or web address].  ?

## 2022-03-14 NOTE — H&P (Signed)
?History and Physical  ? ? ?Patient: Wesley Francis NAT:[id number] DOB: 1966-09-10 ?DOA: 03/14/2022 ?DOS: the patient was seen and examined on 03/15/2022 ?PCP: Jerel Shepherd, FNP  ?Patient coming from: Home ? ?Chief Complaint:  ?Chief Complaint  ?Patient presents with  ? Rectal Bleeding  ? ?HPI: Wesley Francis is a 56 y.o. male with medical history significant for hypertension, asthma, iron deficiency, chronic alcohol abuse, liver cirrhosis, chronic tobacco abuse, arthritis and seasonal allergies who presents to the emergency department due to having dark maroon stool.  Patient was admitted from 4/3 through 4/7 due to GI bleed.  He had an EGD done on 03/10/2022 which showed none bleeding deep duodenal ulcer with circumferential mass at the base concerning for malignancy with biopsy still pending at this time.  He was discharged home yesterday with hemoglobin of 7.6.  He was reported to have several episodes of slight bright red stained stool at home with possible orthostatic changes especially when patient goes from lying in bed to standing, so it was decided for patient to be brought back to the ED this morning. ?Patient was seen as a consult after my colleague (Dr. Avon Gully) discussed the case with GI, 1 unit of PRBC was recommended to be transfused with plan to discharge patient home and to have an outpatient follow-up in the GI office pending biopsy results. ? ?ED Course:  ?In the emergency department, he was tachycardic, but other vital signs were within normal range.  Work-up in the ED showed normocytic anemia with hemoglobin of 7.1, BMP was normal except for bicarb of 21 and BUN of 24, albumin 3.1, ALP 225, total bili 1.9. ?RN reported several dark red clots per rectum with each having the size of a golf club. Patient was reported to have a syncopal episode which lasted for 15 to 20 seconds and occurred when patient got up from bed to try to use the restroom, his systolic BP dropped to the 70s per RN.  He  was quickly placed back in bed, 1 L bolus of normal saline and 2 more PRBCs were ordered to be transfused.  GI reconsulted by ED physician agreed to the fluid and blood transfusion and also recommended CT angiography of the abdomen once patient become more stable. ? ?Review of Systems: ?Review of systems as noted in the HPI. All other systems reviewed and are negative. ? ? ?Past Medical History:  ?Diagnosis Date  ? AKI (acute kidney injury) ([place]) 04/04/2016  ? Anemia   ? Arthritis   ? Asthma   ? ETOH abuse   ? Folate deficiency 04/05/2016  ? Heme positive stool 04/05/2016  ? Hypertension   ? ?Past Surgical History:  ?Procedure Laterality Date  ? BIOPSY  03/10/2022  ? Procedure: BIOPSY;  Surgeon: Harvel Quale, MD;  Location: [facility];  Service: Gastroenterology;;  ? COLONOSCOPY    ? COLONOSCOPY WITH PROPOFOL N/A 03/02/2017  ? Procedure: COLONOSCOPY WITH PROPOFOL;  Surgeon: Lollie Sails, MD;  Location: [facility] ENDOSCOPY;  Service: Endoscopy;  Laterality: N/A;  ? COLONOSCOPY WITH PROPOFOL N/A 07/08/2020  ? Procedure: COLONOSCOPY WITH PROPOFOL;  Surgeon: Lesly Rubenstein, MD;  Location: [facility] ENDOSCOPY;  Service: Endoscopy;  Laterality: N/A;  ? ESOPHAGOGASTRODUODENOSCOPY (EGD) WITH PROPOFOL N/A 07/08/2020  ? Procedure: ESOPHAGOGASTRODUODENOSCOPY (EGD) WITH PROPOFOL;  Surgeon: Lesly Rubenstein, MD;  Location: [facility] ENDOSCOPY;  Service: Endoscopy;  Laterality: N/A;  ? ESOPHAGOGASTRODUODENOSCOPY (EGD) WITH PROPOFOL N/A 03/10/2022  ? Procedure: ESOPHAGOGASTRODUODENOSCOPY (EGD) WITH PROPOFOL;  Surgeon: Jenetta Downer  Roney Marion, MD;  Location: [facility];  Service: Gastroenterology;  Laterality: N/A;  ? FRACTURE SURGERY    ? KNEE ARTHROCENTESIS    ? ? ?Social History:  reports that he has been smoking cigarettes. He has been smoking an average of .5 packs per day. He has never used smokeless tobacco. He reports current alcohol use of about 4.0 standard drinks per week. He reports that he does not use  drugs. ? ? ?No Known Allergies ? ?Family History  ?Problem Relation Age of Onset  ? Hypertension Mother   ? Stroke Mother   ? Hypertension Father   ?  ? ? ?Prior to Admission medications   ?Medication Sig Start Date End Date Taking? Authorizing Provider  ?acetaminophen (TYLENOL) 325 MG tablet Take 650 mg by mouth every 6 (six) hours as needed for mild pain.   Yes [provider]  ?albuterol (PROVENTIL HFA;VENTOLIN HFA) 108 (90 Base) MCG/ACT inhaler Inhale 2 puffs into the lungs every 6 (six) hours as needed for wheezing or shortness of breath. 04/06/16  Yes Rexene Alberts, MD  ?Cholecalciferol (VITAMIN D) 2000 units CAPS Take 1 capsule by mouth every other day.   Yes [provider]  ?metoCLOPramide (REGLAN) 10 MG tablet Take 1 tablet (10 mg total) by mouth 3 (three) times daily as needed (hiccups). 02/15/22  Yes Harris, Abigail, PA-C  ?pantoprazole (PROTONIX) 40 MG tablet Take 1 tablet (40 mg total) by mouth 2 (two) times daily. 03/13/22 04/12/22 Yes Shah, Pratik D, DO  ?sucralfate (CARAFATE) 1 GM/10ML suspension Take 10 mLs (1 g total) by mouth 4 (four) times daily -  with meals and at bedtime. 03/13/22  Yes Manuella Ghazi, Pratik D, DO  ?thiamine 100 MG tablet Take 1 tablet (100 mg total) by mouth daily. 12/23/17  Yes Manuella Ghazi, Pratik D, DO  ? ? ?Physical Exam: ?BP 111/74 (BP Location: Right Arm)   Pulse 77   Temp 98.2 ?F (36.8 ?C) (Oral)   Resp 18   Ht '5\' 6"'$  (1.676 m)   Wt 60.8 kg   SpO2 99%   BMI 21.63 kg/m?  ? ?General: 56 y.o. year-old male well developed well nourished in no acute distress.  Alert and oriented x3. ?HEENT: NCAT, EOMI ?Neck: Supple, trachea medial ?Cardiovascular: Regular rate and rhythm with no rubs or gallops.  No thyromegaly or JVD noted.  No lower extremity edema. 2/4 pulses in all 4 extremities. ?Respiratory: Clear to auscultation with no wheezes or rales. Good inspiratory effort. ?Abdomen: Soft, nontender nondistended with normal bowel sounds x4 quadrants. ?Muskuloskeletal: No cyanosis,  clubbing or edema noted bilaterally ?Neuro: CN II-XII intact, strength 5/5 x 4, sensation, reflexes intact ?Skin: No ulcerative lesions noted or rashes ?Psychiatry: Judgement and insight appear normal. Mood is appropriate for condition and setting ?   ?   ?   ?Labs on Admission:  ?Basic Metabolic Panel: ?Recent Labs  ?Lab 03/10/22 ?0355 03/11/22 ?0405 03/12/22 ?0409 03/13/22 ?0406 03/14/22 ?9741  ?NA 139 138 137 136 137  ?K 3.1* 3.3* 3.5 3.8 4.5  ?CL 109 109 110 109 106  ?CO2 23 23 21* 21* 21*  ?GLUCOSE 87 103* 96 95 91  ?BUN 24* 10 5* 6 24*  ?CREATININE 1.01 0.92 0.83 0.96 1.05  ?CALCIUM 8.1* 7.9* 8.1* 8.4* 9.0  ?MG  --   --  1.4* 1.9  --   ? ?Liver Function Tests: ?Recent Labs  ?Lab 03/09/22 ?6384 03/10/22 ?5364 03/11/22 ?0405 03/12/22 ?6803 03/14/22 ?2122  ?AST 40 22 17 16  31  ?ALT 56* 39 30 25 33  ?ALKPHOS 207* 134* 110 102 225*  ?BILITOT 2.5* 2.9* 2.1* 1.9* 1.9*  ?PROT 5.9* 5.3* 4.9* 4.9* 6.3*  ?ALBUMIN 3.0* 2.7* 2.5* 2.4* 3.1*  ? ?Recent Labs  ?Lab 03/09/22 ?3818  ?LIPASE 49  ? ?No results for input(s): AMMONIA in the last 168 hours. ?CBC: ?Recent Labs  ?Lab 03/09/22 ?0955 03/09/22 ?2306 03/11/22 ?0405 03/12/22 ?2993 03/13/22 ?0406 03/14/22 ?7169 03/14/22 ?1434  ?WBC 17.8*   < > 10.8* 6.6 6.9 9.5 8.3  ?NEUTROABS 15.7*  --   --   --   --  7.8* 5.7  ?HGB 7.4*   < > 7.8* 7.9* 7.6* 7.5* 7.1*  ?HCT 24.3*   < > 25.4* 24.3* 24.5* 25.0* 22.3*  ?MCV 91.0   < > 89.4 90.0 90.4 92.3 90.7  ?PLT 270   < > 258 264 284 324 315  ? < > = values in this interval not displayed.  ? ?Cardiac Enzymes: ?No results for input(s): CKTOTAL, CKMB, CKMBINDEX, TROPONINI in the last 168 hours. ? ?BNP (last 3 results) ?Recent Labs  ?  03/09/22 ?0955  ?BNP 608.0*  ? ? ?ProBNP (last 3 results) ?No results for input(s): PROBNP in the last 8760 hours. ? ?CBG: ?Recent Labs  ?Lab 03/10/22 ?0716 03/12/22 ?6789 03/13/22 ?0720  ?GLUCAP 88 110* 93  ? ? ?Radiological Exams on Admission: ?CT ANGIO GI BLEED ? ?Addendum Date: 03/15/2022   ?ADDENDUM REPORT:  03/15/2022 04:55 ADDENDUM: These results were called by telephone at the time of interpretation on 03/15/2022 at 4:44 am to provider Isahi Godwin , who verbally acknowledged these results. Electronically Signed   B

## 2022-03-14 NOTE — ED Notes (Signed)
Pt given gingerale, apple sauce, and chicken broth.  ?

## 2022-03-14 NOTE — ED Notes (Signed)
Patient states " I have pain in my right hip"                  ?PT started to  get wobbly  on three minute  Mark  of taking his BP. ?

## 2022-03-14 NOTE — ED Provider Notes (Signed)
Patient received 1 unit of packed red blood cells.  He had a syncopal episode and he is having significant rectal bleeding and is hypotensive.  I have ordered a liter bolus of saline and 2 more packed red blood cells.  I spoke with GI and they recommended once the patient gets stable to get a CT angio of the abdomen and have the hospitalist admit the patient.  I spoke with the hospitalist and they will come see the patient and admit him ?  ?Milton Ferguson, MD ?03/14/22 1954 ? ?

## 2022-03-14 NOTE — ED Provider Notes (Addendum)
?[place] ?Provider Note ? ? ?CSN: [id number] ?Arrival date & time: 03/14/22  6213 ? ?  ? ?History ? ?Chief Complaint  ?Patient presents with  ? Rectal Bleeding  ? ? ?Wesley Francis is a 56 y.o. male. ? ?HPI ?Patient here for persistent problems with "ulcer."  His stool continues to be dark, and "looks like blood."  History given by family members at bedside.  Patient states his "hip," hurts he has been eating fairly well.  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 ?  ? ?Home Medications ?Prior to Admission medications   ?Medication Sig Start Date End Date Taking? Authorizing Provider  ?acetaminophen (TYLENOL) 325 MG tablet Take 650 mg by mouth every 6 (six) hours as needed for mild pain.    [provider]  ?albuterol (PROVENTIL HFA;VENTOLIN HFA) 108 (90 Base) MCG/ACT inhaler Inhale 2 puffs into the lungs every 6 (six) hours as needed for wheezing or shortness of breath. 04/06/16   Rexene Alberts, MD  ?Cholecalciferol (VITAMIN D) 2000 units CAPS Take 1 capsule by mouth every other day.    [provider]  ?metoCLOPramide (REGLAN) 10 MG tablet Take 1 tablet (10 mg total) by mouth 3 (three) times daily as needed (hiccups). 02/15/22   Margarita Mail, PA-C  ?montelukast (SINGULAIR) 10 MG tablet Take 10 mg by mouth daily.    [provider]  ?pantoprazole (PROTONIX) 40 MG tablet Take 1 tablet (40 mg total) by mouth 2 (two) times daily. 03/13/22 04/12/22  Manuella Ghazi, Pratik D, DO  ?sucralfate (CARAFATE) 1 GM/10ML suspension Take 10 mLs (1 g total) by mouth 4 (four) times daily -  with meals and at bedtime. 03/13/22   Manuella Ghazi, Pratik D, DO  ?thiamine 100 MG tablet Take 1 tablet (100 mg total) by mouth daily. 12/23/17   Heath Lark D, DO  ?   ? ?Allergies    ?Patient has no known allergies.   ? ?Review of Systems   ?Review of Systems ? ?Physical Exam ?Updated Vital Signs ?BP 110/87   Pulse 81   Temp 98.6 ?F (37 ?C) (Oral)   Resp 16   Ht '5\' 6"'$  (1.676 m)   Wt 60.8 kg   SpO2 100%    BMI 21.63 kg/m?  ?Physical Exam ?Vitals and nursing note reviewed.  ?Constitutional:   ?   General: He is not in acute distress. ?   Appearance: He is well-developed. He is not ill-appearing or diaphoretic.  ?   Comments: Appears older than stated age  ?HENT:  ?   Head: Normocephalic and atraumatic.  ?   Right Ear: External ear normal.  ?   Left Ear: External ear normal.  ?Eyes:  ?   Conjunctiva/sclera: Conjunctivae normal.  ?   Pupils: Pupils are equal, round, and reactive to light.  ?Neck:  ?   Trachea: Phonation normal.  ?Cardiovascular:  ?   Rate and Rhythm: Normal rate.  ?Pulmonary:  ?   Effort: Pulmonary effort is normal.  ?Abdominal:  ?   General: There is no distension.  ?   Tenderness: There is no abdominal tenderness.  ?Musculoskeletal:     ?   General: Normal range of motion.  ?   Cervical back: Normal range of motion and neck supple.  ?Skin: ?   General: Skin is warm and dry.  ?Neurological:  ?   Mental Status: He is alert and oriented to person, place, and time.  ?   Cranial Nerves: No cranial  nerve deficit.  ?   Sensory: No sensory deficit.  ?   Motor: No abnormal muscle tone.  ?   Coordination: Coordination normal.  ?Psychiatric:     ?   Mood and Affect: Mood normal.     ?   Behavior: Behavior normal.     ?   Thought Content: Thought content normal.     ?   Judgment: Judgment normal.  ? ? ?ED Results / Procedures / Treatments   ?Labs ?(all labs ordered are listed, but only abnormal results are displayed) ?Labs Reviewed  ?COMPREHENSIVE METABOLIC PANEL - Abnormal; Notable for the following components:  ?    Result Value  ? CO2 21 (*)   ? BUN 24 (*)   ? Total Protein 6.3 (*)   ? Albumin 3.1 (*)   ? Alkaline Phosphatase 225 (*)   ? Total Bilirubin 1.9 (*)   ? All other components within normal limits  ?CBC WITH DIFFERENTIAL/PLATELET - Abnormal; Notable for the following components:  ? RBC 2.71 (*)   ? Hemoglobin 7.5 (*)   ? HCT 25.0 (*)   ? Neutro Abs 7.8 (*)   ? All other components within normal  limits  ?CBC WITH DIFFERENTIAL/PLATELET - Abnormal; Notable for the following components:  ? RBC 2.46 (*)   ? Hemoglobin 7.1 (*)   ? HCT 22.3 (*)   ? All other components within normal limits  ?PROTIME-INR  ?TYPE AND SCREEN  ?PREPARE RBC (CROSSMATCH)  ? ? ?EKG ?None ? ?Radiology ?No results found. ? ?Procedures ?Procedures  ? ? ?Medications Ordered in ED ?Medications  ?pantoprazole (PROTONIX) EC tablet 40 mg (40 mg Oral Given 03/14/22 1004)  ?sucralfate (CARAFATE) 1 GM/10ML suspension 1 g (1 g Oral Given 03/14/22 1208)  ?thiamine tablet 100 mg (100 mg Oral Given 03/14/22 1004)  ?sodium chloride 0.9 % bolus 500 mL (0 mLs Intravenous Stopped 03/14/22 1004)  ?0.9 %  sodium chloride infusion (10 mL/hr Intravenous New Bag/Given 03/14/22 1522)  ? ? ?ED Course/ Medical Decision Making/ A&P ?Clinical Course as of 03/14/22 1544  ?Sat Mar 14, 2022  ?0918 Patient sisters are with him and are hesitant to take him home now because of the large amount of blood that they saw in the bedside commode.  Because of this will hold patient for a 4-hour hemoglobin level to be done at noon today. [EW]  ?0919 We will initiate usual morning medicines which she did not take today. [EW]  ?  ?Clinical Course User Index ?[EW] Daleen Bo, MD  ? ?                        ?Medical Decision Making ?Presenting for evaluation of rectal bleeding likely from peptic ulcer disease complicated by pancreatic mass. ? ?Amount and/or Complexity of Data Reviewed ?Independent Historian: caregiver ?   Details: Family members at bedside gives most of history.  They state that yesterday after he got home, he fell and was found to have low blood pressure.  EMS assessed him but did not transport him. ?Labs: ordered. ?   Details: CBC, metabolic panel, repeat CBC-hemoglobin trending down to 7.1.  This is at the low consistent with need for transfusion.  Metabolic panel abnormal with elevated BUN, possibly related to upper GI bleeding versus volume depletion.  Creatinine is  normal ?Discussion of management or test interpretation with external provider(s): Consult hospitalist for admission.  He states he would contact GI and see if they  have reason for further interventions versus just transfuse and watch the patient. ? ?Risk ?OTC drugs. ?Prescription drug management. ?Risk Details: Patient with ongoing rectal bleeding, has trended down from 7.9-7.1 over the last 48 hours.  He recently had 2 units of blood transfusion taken hemoglobin to over 8.  Suspect ongoing upper GI bleeding.  INR is normal.  Ordered packed red cells, x1 unit.  Doubt problems from pancreatic mass at this time.  Consulted hospitalist who evaluated the patient and talk to patient's GI doctor.  Plan will be to give patient a unit of blood then discharge with current plan in place, for GI follow-up.  Patient is to return here for problems in the meantime.  Family agreeable. ? ?Critical Care ?Total time providing critical care: 35 minutes ? ? ? ? ? ? ? ? ? ?Final Clinical Impression(s) / ED Diagnoses ?Final diagnoses:  ?Rectal bleeding  ?Anemia, unspecified type  ? ? ?Rx / DC Orders ?ED Discharge Orders   ? ? None  ? ?  ? ? ?  ?Daleen Bo, MD ?03/14/22 1522 ? ?  ?Daleen Bo, MD ?03/14/22 1544 ? ?

## 2022-03-15 ENCOUNTER — Inpatient Hospital Stay (HOSPITAL_COMMUNITY): Payer: Medicaid Other

## 2022-03-15 ENCOUNTER — Other Ambulatory Visit (HOSPITAL_COMMUNITY): Payer: Self-pay | Admitting: *Deleted

## 2022-03-15 DIAGNOSIS — F1010 Alcohol abuse, uncomplicated: Secondary | ICD-10-CM | POA: Diagnosis not present

## 2022-03-15 DIAGNOSIS — R55 Syncope and collapse: Secondary | ICD-10-CM

## 2022-03-15 DIAGNOSIS — K625 Hemorrhage of anus and rectum: Secondary | ICD-10-CM | POA: Diagnosis not present

## 2022-03-15 DIAGNOSIS — K7010 Alcoholic hepatitis without ascites: Secondary | ICD-10-CM | POA: Diagnosis present

## 2022-03-15 DIAGNOSIS — I422 Other hypertrophic cardiomyopathy: Secondary | ICD-10-CM

## 2022-03-15 DIAGNOSIS — R569 Unspecified convulsions: Secondary | ICD-10-CM | POA: Diagnosis not present

## 2022-03-15 DIAGNOSIS — R578 Other shock: Secondary | ICD-10-CM | POA: Diagnosis not present

## 2022-03-15 DIAGNOSIS — D649 Anemia, unspecified: Secondary | ICD-10-CM | POA: Diagnosis not present

## 2022-03-15 DIAGNOSIS — K7030 Alcoholic cirrhosis of liver without ascites: Secondary | ICD-10-CM | POA: Diagnosis not present

## 2022-03-15 HISTORY — PX: IR ANGIOGRAM SELECTIVE EACH ADDITIONAL VESSEL: IMG667

## 2022-03-15 HISTORY — PX: IR ANGIOGRAM VISCERAL SELECTIVE: IMG657

## 2022-03-15 HISTORY — PX: IR EMBO ART  VEN HEMORR LYMPH EXTRAV  INC GUIDE ROADMAPPING: IMG5450

## 2022-03-15 HISTORY — PX: IR US GUIDE VASC ACCESS RIGHT: IMG2390

## 2022-03-15 HISTORY — PX: IR ANGIOGRAM FOLLOW UP STUDY: IMG697

## 2022-03-15 LAB — HEMOGLOBIN AND HEMATOCRIT, BLOOD
HCT: 21.4 % — ABNORMAL LOW (ref 39.0–52.0)
Hemoglobin: 6.7 g/dL — CL (ref 13.0–17.0)

## 2022-03-15 LAB — COMPREHENSIVE METABOLIC PANEL
ALT: 23 U/L (ref 0–44)
AST: 22 U/L (ref 15–41)
Albumin: 2.1 g/dL — ABNORMAL LOW (ref 3.5–5.0)
Alkaline Phosphatase: 166 U/L — ABNORMAL HIGH (ref 38–126)
Anion gap: 5 (ref 5–15)
BUN: 21 mg/dL — ABNORMAL HIGH (ref 6–20)
CO2: 20 mmol/L — ABNORMAL LOW (ref 22–32)
Calcium: 7.7 mg/dL — ABNORMAL LOW (ref 8.9–10.3)
Chloride: 113 mmol/L — ABNORMAL HIGH (ref 98–111)
Creatinine, Ser: 0.86 mg/dL (ref 0.61–1.24)
GFR, Estimated: >60 mL/min (ref >60)
Glucose, Bld: 95 mg/dL (ref 70–99)
Potassium: 4.3 mmol/L (ref 3.5–5.1)
Sodium: 138 mmol/L (ref 135–145)
Total Bilirubin: 2 mg/dL — ABNORMAL HIGH (ref 0.3–1.2)
Total Protein: 4.1 g/dL — ABNORMAL LOW (ref 6.5–8.1)

## 2022-03-15 LAB — CBC
HCT: 24.4 % — ABNORMAL LOW (ref 39.0–52.0)
HCT: 29.4 % — ABNORMAL LOW (ref 39.0–52.0)
Hemoglobin: 7.8 g/dL — ABNORMAL LOW (ref 13.0–17.0)
Hemoglobin: 9.8 g/dL — ABNORMAL LOW (ref 13.0–17.0)
MCH: 28.4 pg (ref 26.0–34.0)
MCH: 29.5 pg (ref 26.0–34.0)
MCHC: 32 g/dL (ref 30.0–36.0)
MCHC: 33.3 g/dL (ref 30.0–36.0)
MCV: 88.7 fL (ref 80.0–100.0)
MCV: 88.6 fL (ref 80.0–100.0)
Platelets: 204 10*3/uL (ref 150–400)
Platelets: 175 10*3/uL (ref 150–400)
RBC: 2.75 MIL/uL — ABNORMAL LOW (ref 4.22–5.81)
RBC: 3.32 MIL/uL — ABNORMAL LOW (ref 4.22–5.81)
RDW: 14.5 % (ref 11.5–15.5)
RDW: 14.6 % (ref 11.5–15.5)
WBC: 6.9 10*3/uL (ref 4.0–10.5)
WBC: 11.3 10*3/uL — ABNORMAL HIGH (ref 4.0–10.5)
nRBC: 0 % (ref 0.0–0.2)
nRBC: 0 % (ref 0.0–0.2)

## 2022-03-15 LAB — ECHOCARDIOGRAM COMPLETE
Area-P 1/2: 4.89 cm2
Calc EF: 82.3 %
Height: 66 in
MV M vel: 7.45 m/s
MV Peak grad: 222 mmHg
Radius: 1 cm
S' Lateral: 1.6 cm
Single Plane A2C EF: 83.8 %
Single Plane A4C EF: 84.2 %
Weight: 2204.6 oz

## 2022-03-15 LAB — BPAM PLATELET PHERESIS
Blood Product Expiration Date: 202304122359
Unit Type and Rh: 5100

## 2022-03-15 LAB — PREPARE PLATELET PHERESIS: Unit division: 0

## 2022-03-15 LAB — PHOSPHORUS: Phosphorus: 3.4 mg/dL (ref 2.5–4.6)

## 2022-03-15 LAB — TYPE AND SCREEN
ABO/RH(D): A POS
Antibody Screen: NEGATIVE

## 2022-03-15 LAB — GLUCOSE, CAPILLARY: Glucose-Capillary: 90 mg/dL (ref 70–99)

## 2022-03-15 LAB — MAGNESIUM: Magnesium: 1.6 mg/dL — ABNORMAL LOW (ref 1.7–2.4)

## 2022-03-15 LAB — MRSA NEXT GEN BY PCR, NASAL
MRSA by PCR Next Gen: NOT DETECTED
MRSA by PCR Next Gen: NOT DETECTED

## 2022-03-15 LAB — PREPARE RBC (CROSSMATCH)

## 2022-03-15 MED ORDER — LACTATED RINGERS IV SOLN: INTRAVENOUS | Status: DC

## 2022-03-15 MED ORDER — PANTOPRAZOLE SODIUM 40 MG IV SOLR
40.0000 mg | Freq: Two times a day (BID) | INTRAVENOUS | Status: DC
  Administered 2022-03-18 – 2022-03-20 (×4): 40 mg via INTRAVENOUS
  Filled 2022-03-15 (×4): qty 10

## 2022-03-15 MED ORDER — SODIUM CHLORIDE 0.9% IV SOLUTION: Freq: Once | INTRAVENOUS | Status: AC

## 2022-03-15 MED ORDER — MIDAZOLAM HCL 2 MG/2ML IJ SOLN
INTRAMUSCULAR | Status: AC | PRN
  Administered 2022-03-15 (×2): 1 mg via INTRAVENOUS
  Administered 2022-03-15 (×2): .5 mg via INTRAVENOUS

## 2022-03-15 MED ORDER — IOHEXOL 300 MG/ML  SOLN
100.0000 mL | Freq: Once | INTRAMUSCULAR | Status: AC | PRN
  Administered 2022-03-15: 45 mL via INTRA_ARTERIAL

## 2022-03-15 MED ORDER — MAGNESIUM SULFATE 2 GM/50ML IV SOLN
2.0000 g | Freq: Once | INTRAVENOUS | Status: AC
  Administered 2022-03-15: 2 g via INTRAVENOUS
  Filled 2022-03-15: qty 50

## 2022-03-15 MED ORDER — NOREPINEPHRINE 4 MG/250ML-% IV SOLN
INTRAVENOUS | Status: AC
Start: 2022-03-15 — End: 2022-03-15
  Filled 2022-03-15: qty 250

## 2022-03-15 MED ORDER — IOHEXOL 350 MG/ML SOLN
100.0000 mL | Freq: Once | INTRAVENOUS | Status: AC | PRN
Start: 2022-03-15 — End: 2022-03-15
  Administered 2022-03-15: 100 mL via INTRAVENOUS

## 2022-03-15 MED ORDER — LORAZEPAM 2 MG/ML IJ SOLN: 2.0000 mg | INTRAMUSCULAR | Status: DC | PRN

## 2022-03-15 MED ORDER — PANTOPRAZOLE 80MG IVPB - SIMPLE MED
80.0000 mg | Freq: Once | INTRAVENOUS | Status: DC
Start: 2022-03-15 — End: 2022-03-15

## 2022-03-15 MED ORDER — NOREPINEPHRINE 4 MG/250ML-% IV SOLN: 0.0000 ug/min | INTRAVENOUS | Status: DC

## 2022-03-15 MED ORDER — LACTATED RINGERS IV BOLUS
2000.0000 mL | Freq: Once | INTRAVENOUS | Status: AC
  Administered 2022-03-15: 2000 mL via INTRAVENOUS

## 2022-03-15 MED ORDER — MIDAZOLAM HCL 2 MG/2ML IJ SOLN
INTRAMUSCULAR | Status: AC
  Filled 2022-03-15: qty 2

## 2022-03-15 MED ORDER — LIDOCAINE HCL 1 % IJ SOLN
INTRAMUSCULAR | Status: AC
Start: 2022-03-15 — End: 2022-03-16
  Filled 2022-03-15: qty 20

## 2022-03-15 MED ORDER — SODIUM CHLORIDE 0.9 % IV SOLN
750.0000 mg | Freq: Two times a day (BID) | INTRAVENOUS | Status: DC
  Filled 2022-03-15 (×2): qty 7.5

## 2022-03-15 MED ORDER — GELATIN ABSORBABLE 12-7 MM EX MISC
CUTANEOUS | Status: AC
  Filled 2022-03-15: qty 1

## 2022-03-15 MED ORDER — LEVETIRACETAM IN NACL 1000 MG/100ML IV SOLN: 1000.0000 mg | INTRAVENOUS | Status: DC

## 2022-03-15 MED ORDER — FENTANYL CITRATE (PF) 100 MCG/2ML IJ SOLN
INTRAMUSCULAR | Status: AC
Start: 2022-03-15 — End: 2022-03-16
  Filled 2022-03-15: qty 2

## 2022-03-15 MED ORDER — IOHEXOL 300 MG/ML  SOLN
100.0000 mL | Freq: Once | INTRAMUSCULAR | Status: AC | PRN
  Administered 2022-03-15: 42 mL via INTRA_ARTERIAL

## 2022-03-15 MED ORDER — SODIUM CHLORIDE 0.9% IV SOLUTION: Freq: Once | INTRAVENOUS | Status: DC

## 2022-03-15 MED ORDER — FENTANYL CITRATE (PF) 100 MCG/2ML IJ SOLN
INTRAMUSCULAR | Status: AC | PRN
  Administered 2022-03-15 (×4): 25 ug via INTRAVENOUS

## 2022-03-15 MED ORDER — PANTOPRAZOLE INFUSION (NEW) - SIMPLE MED
8.0000 mg/h | INTRAVENOUS | Status: AC
Start: 2022-03-15 — End: 2022-03-18
  Administered 2022-03-15 – 2022-03-17 (×6): 8 mg/h via INTRAVENOUS
  Filled 2022-03-15 (×2): qty 80
  Filled 2022-03-15 (×5): qty 100
  Filled 2022-03-15: qty 80
  Filled 2022-03-15: qty 100
  Filled 2022-03-15 (×2): qty 80
  Filled 2022-03-15: qty 100

## 2022-03-15 MED ORDER — PANTOPRAZOLE SODIUM 40 MG IV SOLR
40.0000 mg | Freq: Two times a day (BID) | INTRAVENOUS | Status: DC
Start: 2022-03-15 — End: 2022-03-15
  Administered 2022-03-15: 40 mg via INTRAVENOUS
  Filled 2022-03-15: qty 10

## 2022-03-15 MED ORDER — SODIUM CHLORIDE 0.9 % IV SOLN: 2000.0000 mg | Freq: Once | INTRAVENOUS | Status: DC

## 2022-03-15 NOTE — Assessment & Plan Note (Signed)
Continues to smoke 0.5ppd. ? ?- Nicotine patch for 36month to help with abstinence.  ?- Lung cancer screening referral ?

## 2022-03-15 NOTE — Assessment & Plan Note (Signed)
Still drinking at home ?No signs of withdrawal at this time.  ?

## 2022-03-15 NOTE — Sedation Documentation (Signed)
Dual lumen PICC to be placed in upper left arm prior to angiogram. Labs will be drawn for H and H and type and screen. ?

## 2022-03-15 NOTE — Assessment & Plan Note (Addendum)
Had multiple red bowel movements today.  Nadir hemoglobin 6.7 ? ?-For IR embolization of presumed duodenal ulcer hemorrhage ?-Continue IV PPI ?

## 2022-03-15 NOTE — Sedation Documentation (Signed)
Picc line to be placed. No sedation at this time  ?

## 2022-03-15 NOTE — Progress Notes (Signed)
?  Echocardiogram ?2D Echocardiogram has been performed. ? ?Wesley Francis ?03/15/2022, 12:17 PM ?

## 2022-03-15 NOTE — Consult Note (Shared)
? ?Chief Complaint: Patient was seen in consultation today for GI bleeding. ? ?Referring Physician(s): Hurshel Keys, OD ? ?Supervising Physician: Markus Daft ? ?Patient Status: [facility] inpatient, being transferred to [facility] for IR procedure ? ?History of Present Illness: ?Wesley Francis is a 56 y.o. male with a past medical history significant for HTN, anemia, AKI, ETOH abuse, recent GI bleed who presented to [facility] ED 4/8 with complaints of dark stools. He had previously been admitted to [facility] from 03/09/22 to 03/13/22 for GI bleeding, he underwent EGD 4/4 which showed congested mucosa of the pylorus, biopsies with reactive gastropathy and 1 large deeply crated ulcer in the duodenum where the ampulla should be located with a circumferential mass present at the base of the ulcer. He was discharged on 4/7 with hgb of 7.6 and resolution of symptoms. On presented to the ED the following evening he was found to have hgb 7.5 for which he received 1 unit of pRBCs. Shortly after the completing the transfusion he became hypotensive and had a syncopal episode with several bloody bowel movements. GI at [facility] was contacted and CTA was recommended, this was performed at 0325 this morning and showed: ? ?8 mm GDA pseudoaneurysm at the base of the patient's duodenal lesion (ulcer versus complicated diverticulum versus malignancy). ?High-density in the lumen of the proximal small bowel from recent bleeding, but no active bleeding at time of scan. ?  ?He received 2 more units of pRBCs overnight and was overall stable this morning, however around 10:30 am he experienced a seizure-like episode, multiple bloody bowel movements and hypotension. He has since received 2 additional units of pRBCs and IR has been consulted for angiogram with possible embolization. ? ? ?Past Medical History:  ?Diagnosis Date  ? AKI (acute kidney injury) ([place]) 04/04/2016  ? Anemia   ? Arthritis   ? Asthma   ? ETOH abuse   ? Folate deficiency 04/05/2016  ? Heme positive  stool 04/05/2016  ? Hypertension   ? ? ?Past Surgical History:  ?Procedure Laterality Date  ? BIOPSY  03/10/2022  ? Procedure: BIOPSY;  Surgeon: Harvel Quale, MD;  [facility]O SUITE;  Service: Gastroenterology;;  ? COLONOSCOPY    ? COLONOSCOPY WITH PROPOFOL N/A 03/02/2017  ? Procedure: COLONOSCOPY WITH PROPOFOL;  Surgeon: Lollie Sails, MD;  Location[facility] ENDOSCOPY;  Service: Endoscopy;  Laterality: N/A;  ? COLONOSCOPY WITH PROPOFOL N/A 07/08/2020  ? Procedure: COLONOSCOPY WITH PROPOFOL;  Surgeon: Lesly Rubenstein, MD;  Location[facility] ENDOSCOPY;  Service: Endoscopy;  Laterality: N/A;  ? ESOPHAGOGASTRODUODENOSCOPY (EGD) WITH PROPOFOL N/A 07/08/2020  ? Procedure: ESOPHAGOGASTRODUODENOSCOPY (EGD) WITH PROPOFOL;  Surgeon: Lesly Rubenstein, MD;  Location: [facility] ENDOSCOPY;  Service: Endoscopy;  Laterality: N/A;  ? ESOPHAGOGASTRODUODENOSCOPY (EGD) WITH PROPOFOL N/A 03/10/2022  ? Procedure: ESOPHAGOGASTRODUODENOSCOPY (EGD) WITH PROPOFOL;  Surgeon: Harvel Quale, MD;  Location: [facility] ENDO SUITE;  Service: Gastroenterology;  Laterality: N/A;  ? FRACTURE SURGERY    ? KNEE ARTHROCENTESIS    ? ? ?Allergies: ?Patient has no known allergies. ? ?Medications: ?Prior to Admission medications   ?Medication Sig Start Date End Date Taking? Authorizing Provider  ?acetaminophen (TYLENOL) 325 MG tablet Take 650 mg by mouth every 6 (six) hours as needed for mild pain.   Yes [provider]  ?albuterol (PROVENTIL HFA;VENTOLIN HFA) 108 (90 Base) MCG/ACT inhaler Inhale 2 puffs into the lungs every 6 (six) hours as needed for wheezing or shortness of breath. 04/06/16  Yes Rexene Alberts, MD  ?Cholecalciferol (VITAMIN  D) 2000 units CAPS Take 1 capsule by mouth every other day.   Yes [provider]  ?metoCLOPramide (REGLAN) 10 MG tablet Take 1 tablet (10 mg total) by mouth 3 (three) times daily as needed (hiccups). 02/15/22  Yes Harris, Abigail, PA-C  ?pantoprazole (PROTONIX) 40 MG tablet Take 1 tablet  (40 mg total) by mouth 2 (two) times daily. 03/13/22 04/12/22 Yes Shah, Pratik D, DO  ?sucralfate (CARAFATE) 1 GM/10ML suspension Take 10 mLs (1 g total) by mouth 4 (four) times daily -  with meals and at bedtime. 03/13/22  Yes Manuella Ghazi, Pratik D, DO  ?thiamine 100 MG tablet Take 1 tablet (100 mg total) by mouth daily. 12/23/17  Yes Manuella Ghazi, Pratik D, DO  ?  ? ?Family History  ?Problem Relation Age of Onset  ? Hypertension Mother   ? Stroke Mother   ? Hypertension Father   ? ? ?Social History  ? ?Socioeconomic History  ? Marital status: Single  ?  Spouse name: Not on file  ? Number of children: Not on file  ? Years of education: Not on file  ? Highest education level: Not on file  ?Occupational History  ? Not on file  ?Tobacco Use  ? Smoking status: Every Day  ?  Packs/day: 0.50  ?  Types: Cigarettes  ? Smokeless tobacco: Never  ?Vaping Use  ? Vaping Use: Never used  ?Substance and Sexual Activity  ? Alcohol use: Yes  ?  Alcohol/week: 4.0 standard drinks  ?  Types: 4 Cans of beer per week  ?  Comment: yesterday 03/09/22  ? Drug use: No  ? Sexual activity: Not on file  ?Other Topics Concern  ? Not on file  ?Social History Narrative  ? Not on file  ? ?Social Determinants of Health  ? ?Financial Resource Strain: Not on file  ?Food Insecurity: Not on file  ?Transportation Needs: Not on file  ?Physical Activity: Not on file  ?Stress: Not on file  ?Social Connections: Not on file  ? ? ? ?Review of Systems: A 12 point ROS discussed and pertinent positives are indicated in the HPI above.  All other systems are negative. ? ?Review of Systems ? ?Vital Signs: ?BP 114/62   Pulse (!) 121   Temp (!) 97.1 ?F (36.2 ?C) (Axillary)   Resp 12   Ht '5\' 6"'$  (1.676 m)   Wt 137 lb 12.6 oz (62.5 kg)   SpO2 100%   BMI 22.24 kg/m?  ? ?Physical Exam ? ? ?  ? ? ?Imaging: ?CT ABDOMEN WO CONTRAST ? ?Result Date: 03/09/2022 ?CLINICAL DATA:  Acute abdominal pain. Rectal bleeding. Questionable duodenal perforation. EXAM: CT ABDOMEN WITHOUT CONTRAST TECHNIQUE:  Multidetector CT imaging of the abdomen was performed following the standard protocol without IV contrast. RADIATION DOSE REDUCTION: This exam was performed according to the departmental dose-optimization program which includes automated exposure control, adjustment of the mA and/or kV according to patient size and/or use of iterative reconstruction technique. COMPARISON:  CTA abdomen and pelvis 03/09/2022. FINDINGS: Lower chest: There is atelectasis or scarring in the lung bases. Hepatobiliary: Again seen is air within the gallbladder, common bile duct and left intrahepatic ducts similar to the prior study. No gallstones are identified. Liver otherwise appears within normal limits. Pancreas: Enlargement of the pancreatic head with surrounding inflammation is similar to the prior study. There is air within the pancreatic head, also unchanged. Pancreatic body and tail are within normal limits. Spleen: Normal in size without focal abnormality. Adrenals/Urinary Tract: There  is a 15 mm cyst in the superior pole the right kidney. Additional hypodensity in the left kidney is too small to characterize, but also likely a cyst. There is no hydronephrosis. Stomach/Bowel: Oral contrast is seen throughout the stomach and mid small bowel loops. Homogeneous density noted within the stomach lumen measuring 7.3 x 9.2 x 4.1 cm which may represent hematoma. Stomach is nondilated. There is no extravasation of oral contrast in the region of the duodenum. Duodenal wall thickening and inflammation versus there is no free air in this region. Visualized small bowel loops are otherwise within normal limits. Vascular/Lymphatic: No significant vascular findings are present. No enlarged abdominal or pelvic lymph nodes. Other: No ascites or free air.  No focal abdominal wall hernia. Musculoskeletal: No acute or significant osseous findings. IMPRESSION: 1. Again seen is inflammation and wall thickening of the duodenum with enlargement of the  pancreatic head. Central air in the pancreatic head is unchanged. There is no extravasation of oral contrast or free air to indicate bowel perforation. Oral contrast reaches mid small bowel. 2. Persistent pn

## 2022-03-15 NOTE — H&P (Signed)
? ?NAME:  KALIM KISSEL, MRN:  [id number], DOB:  [birth year], LOS: 1 ?ADMISSION DATE:  03/14/2022, CONSULTATION DATE: 03/15/2022 ?REFERRING MD: [facility], CHIEF COMPLAINT: Recurrent GI bleed ? ?History of Present Illness:  ?56 year old male with a past medical history of alcohol abuse, hypertension, asthma, iron deficiency who presented to [facility] emergency room having dark stools.  He is recently been discharged from [facility] after 3 days.  He presents again with some bloody stools symptomatic anemia he has been fluid resuscitated and transfused with packed cells stabilized and needs to be transferred to [facility] and be seen by interventional radiology for coiling of GI bleed.  Critical care has been asked to admit. ? ?Pertinent  Medical History  ? ?Past Medical History:  ?Diagnosis Date  ? AKI (acute kidney injury) ([place]) 04/04/2016  ? Anemia   ? Arthritis   ? Asthma   ? ETOH abuse   ? Folate deficiency 04/05/2016  ? Heme positive stool 04/05/2016  ? Hypertension   ? ? ? ?Significant Hospital Events: ?Including procedures, antibiotic start and stop dates in addition to other pertinent events   ?03/15/2022 transferred from [facility] to [facility] ? ?Interim History / Subjective:  ?Recurrent GI bleeding.  None since this morning.  Patient denies abdominal pain. ? ?Objective   ?Blood pressure 127/88, pulse 90, temperature 97.6 ?F (36.4 ?C), temperature source Axillary, resp. rate 16, height '5\' 6"'$  (1.676 m), weight 62.5 kg, SpO2 100 %. ?   ?FiO2 (%):  [21 %] 21 %  ? ?Intake/Output Summary (Last 24 hours) at 03/15/2022 1602 ?Last data filed at 03/15/2022 1530 ?Gross per 24 hour  ?Intake 2528.08 ml  ?Output 1050 ml  ?Net 1478.08 ml  ? ?Filed Weights  ? 03/14/22 0727 03/15/22 0500  ?Weight: 60.8 kg 62.5 kg  ? ? ?Examination: ?General: Thin man.  Awake alert. ?HENT: Mild scleral icterus ?Lungs: Chest clear to auscultation bilaterally ?Cardiovascular: Sounds are unremarkable no  gallops ?Abdomen: Soft and nontender.  Positive bowel sounds ?Extremities: No peripheral edema ?Neuro: Awake alert and oriented.  No focal deficits.  No tremor. ?GU: Voiding spontaneously ? ?Ancillary tests personally reviewed:  ?Creatinine remains normal 0.86 ?Mildly elevated alkaline phosphatase 166 ?AST ALT have normalized ?Mild hyperbilirubinemia 2.0 ?Platelet count normal 204 ?INR 1.1. ?Assessment & Plan:  ?Tobacco abuse ?Continues to smoke 0.5ppd. ? ?- Nicotine patch for 13month to help with abstinence.  ?- Lung cancer screening referral ? ?Alcoholic steatohepatitis ?[facility]/ALT but morphology on [place] consistent with cirrhosis.  ? ?- Avoid hepatotoxins ? ?Alcohol abuse ?Still drinking at home ?No signs of withdrawal at this time.  ? ?Syncope ?Syncope associated with mild seizure like activity from transient hypoperfusion. Neurologically intact at present.  ? ?- Stop AED's and not need for EEG ? ?[place] ?Had another large bloody melanotic stool today with nadir hemoglobin 6.7. ?Blood pressure has improved with 2 units PRBC.  Platelet and fresh frozen plasma transfusion pending. ? ?-Transfuse as needed ? ?Duodenal ulcer hemorrhage ?Had multiple red bowel movements today.  Nadir hemoglobin 6.7 ? ?-For IR embolization of presumed duodenal ulcer hemorrhage ?-Continue IV PPI ? ?Hypertrophic cardiomyopathy ([place] ?Echocardiogram 4/9 consistent with possible left ventricular hypertrophy with S.A.M. and intracavitary gradient.  No compatible murmur on examination ? ?-Avoid hypovolemia ?-Consider CMR as outpatient to better assess ? ? ?Best Practice (right click and "Reselect all SmartList Selections" daily)  ? ?Diet/type: NPO ?DVT prophylaxis: not indicated ?  GI prophylaxis: PPI ?Lines: N/A ?Foley:  N/A ?Code Status:  full code ?Last date of multidisciplinary goals of care discussion [tbd] ? ?CRITICAL CARE ?Performed by: Kipp Brood ? ? ?Total critical care time: 40  minutes ? ?Critical care time was exclusive of separately billable procedures and treating other patients. ? ?Critical care was necessary to treat or prevent imminent or life-threatening deterioration. ? ?Critical care was time spent personally by me on the following activities: development of treatment plan with patient and/or surrogate as well as nursing, discussions with consultants, evaluation of patient's response to treatment, examination of patient, obtaining history from patient or surrogate, ordering and performing treatments and interventions, ordering and review of laboratory studies, ordering and review of radiographic studies, pulse oximetry, re-evaluation of patient's condition and participation in multidisciplinary rounds. ? ?Kipp Brood, MD FRCPC ?ICU Physician ?[place]  ?Pager: [phone] ?Mobile: [phone] ?After hours: [phone]. ? ? ? ?03/15/2022, 4:02 PM ? ? ?  ?

## 2022-03-15 NOTE — Consult Note (Signed)
Neurology Consultation ? ?Reason for Consult: Seizure-like activity  ?Referring Physician: Dr. Manuella Ghazi ? ?CC: Recurrent GI bleed ? ?History is obtained from:medical record ? ?HPI: Wesley Francis is a 56 y.o. male with past medical history of alcohol abuse, hypertension, asthma, iron deficiency,  liver cirrhosis, chronic tobacco abuse, arthritis and seasonal allergies who presented to [facility] emergency room having dark stools. He was admitted from 4/3 through 4/7 due to GI bleed.  He had an EGD done on 03/10/2022 which showed bleeding deep duodenal ulcer with circumferential mass at the base concerning for malignancy with biopsy still pending at this time.  He was discharged home yesterday with hemoglobin of 7.6.  He was reported to have several episodes of slight bright red stained stool at home with possible orthostatic changes especially when patient goes from lying in bed to standing, so it was decided for patient to be brought back to the ED on the morning of 4/8. He became hypotensive today and had a syncopal event with dark red clots per rectum. 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  GI has spoken with IR and patient will need transfer to [facility] for coil embolization. ? ?Per Dr. Trena Platt note on 4/9 "had an episode of unresponsiveness with leftward gaze and full body contraction that appeared to be a possible seizure versus syncopal episode and also subsequently developed hypotension with 4 large bloody bowel movements" He was loaded with Keppra 2gm. Neurology consulted for assistance. ? ?Regarding the above episode, one family member witnessed it at home (prior to readmission) and stated that he slid out of his chair to the ground, eyes were open and unblinking, rolled up in his head, and he was not responding to any questions or commands. There was no limb stiffening, no limb shaking, no twitching movements of the face or eyes,  no tongue biting and no incontinence. After 5 minutes, he started to show visible attempts at getting back up and was able to answer questions without any intervening confusion. Patient states that he was aware during the entire episode. When asked why he was not responding, he states "I was trying to get up." ? ?He has cut down on his drinking recently. He states that currently he drinks about three 12 oz beers socially about 3-4 times per week.  ? ? ?ROS: As per HPI. The patient has no other complaints.   ? ?Past Medical History:  ?Diagnosis Date  ? AKI (acute kidney injury) ([place]) 04/04/2016  ? Anemia   ? Arthritis   ? Asthma   ? ETOH abuse   ? Folate deficiency 04/05/2016  ? Heme positive stool 04/05/2016  ? Hypertension   ? ? ?Family History  ?Problem Relation Age of Onset  ? Hypertension Mother   ? Stroke Mother   ? Hypertension Father   ? ? ? ?Social History:  ? reports that he has been smoking cigarettes. He has been smoking an average of .5 packs per day. He has never used smokeless tobacco. He reports current alcohol use of about 4.0 standard drinks per week. He reports that he does not use drugs. ? ?Medications ? ?Current Facility-Administered Medications:  ?  0.9 %  sodium chloride infusion (Manually program via Guardrails IV Fluids), , Intravenous, Once, Manuella Ghazi, Pratik D, DO ?  Chlorhexidine Gluconate Cloth 2 % PADS 6 each, 6 each, Topical, Daily, Adefeso, Oladapo, DO, 6 each at 03/15/22 0739 ?  fentaNYL (SUBLIMAZE) 100 MCG/2ML injection, , , ,  ?  gelatin adsorbable (GELFOAM/SURGIFOAM) 12-7 MM sponge 12-7 mm, , , ,  ?  lactated ringers infusion, , Intravenous, Continuous, Manuella Ghazi, Pratik D, DO, Last Rate: 75 mL/hr at 03/15/22 1535, New Bag at 03/15/22 1535 ?  lidocaine (XYLOCAINE) 1 % (with pres) injection, , , ,  ?  LORazepam (ATIVAN) injection 2 mg, 2 mg, Intravenous, Q4H PRN, Manuella Ghazi, Pratik D, DO ?  midazolam (VERSED) 2 MG/2ML injection, , , ,  ?  nicotine (NICODERM CQ - dosed in mg/24 hours) patch 21 mg,  21 mg, Transdermal, Daily, Adefeso, Oladapo, DO, 21 mg at 03/15/22 0741 ?  norepinephrine (LEVOPHED) '4mg'$  in 273m (0.016 mg/mL) premix infusion, 0-40 mcg/min, Intravenous, Titrated, SManuella Ghazi Pratik D, DO, Held at 03/15/22 1140 ?  [START ON 03/18/2022] pantoprazole (PROTONIX) injection 40 mg, 40 mg, Intravenous, Q12H, Shah, Pratik D, DO ?  pantoprozole (PROTONIX) 80 mg /NS 100 mL infusion, 8 mg/hr, Intravenous, Continuous, Shah, Pratik D, DO, Last Rate: 10 mL/hr at 03/15/22 1130, 8 mg/hr at 03/15/22 1130 ?  sucralfate (CARAFATE) 1 GM/10ML suspension 1 g, 1 g, Oral, TID WC & HS, Adefeso, Oladapo, DO, 1 g at 03/14/22 2134 ?  thiamine tablet 100 mg, 100 mg, Oral, Daily, Adefeso, Oladapo, DO, 100 mg at 03/14/22 1004 ? ? ?Exam: ?Current vital signs: ?BP (!) 142/91 (BP Location: Left Leg)   Pulse 93   Temp 97.6 ?F (36.4 ?C) (Axillary)   Resp 16   Ht '5\' 6"'$  (1.676 m)   Wt 62.5 kg   SpO2 96%   BMI 22.24 kg/m?  ?Vital signs in last 24 hours: ?Temp:  [97 ?F (36.1 ?C)-98.8 ?F (37.1 ?C)] 97.6 ?F (36.4 ?C) (04/09 1400) ?Pulse Rate:  [66-121] 93 (04/09 1620) ?Resp:  [12-27] 16 (04/09 1620) ?BP: (35-148)/(12-91) 142/91 (04/09 1620) ?SpO2:  [95 %-100 %] 96 % (04/09 1620) ?FiO2 (%):  [21 %] 21 % (04/08 2235) ?Weight:  [62.5 kg] 62.5 kg (04/09 0500) ? ?GENERAL: Awake, alert in NAD ?HEENT: - [place]/AT ?LUNGS - Respirations unlabored ?Ext: Cool to touch distally but without cyanosis or pallor. Chronic eschar at tip of left big toe. RUE edema is noted.  ? ?Mental Status: Awake and alert. Speech is sparse but fluent with intact comprehension.  ?Cranial Nerves: ?II: Temporal visual fields intact bilaterally with no extinction to DSS. PERRL.  ?III,IV, VI: No ptosis. EOMI. No nystagmus.  ?V: Temp sensation equal bilaterally ?VII: Smile symmetric ?VIII: Hearing intact to conversation ?IX,X: No hoarseness or hypophonia ?XI: Symmetric ?XII: Midline tongue extension ?Motor: ?Right : Upper extremity   5/5    Left:     Upper extremity   5/5 ? Lower  extremity   5/5     Lower extremity   5/5 ?No pronator drift ?Sensory: Temp and light touch intact throughout, bilaterally. No extinction to DSS.  ?Deep Tendon Reflexes: 1+ bilateral brachioradialis. 2+ and symmetric patellae. 0 bilateral achilles. Toes downgoing.  ?Cerebellar: No ataxia with FNF bilaterally ?Gait: Deferred ? ? ?Assessment: 56y.o. male with a 5 minute spell of awake unresponsiveness at home. He was loaded with Keppra 2000 mg at the OSH. No history of seizures.  ?- Exam is nonfocal ?- Semiology of the spell at home does not favor a seizure. See HPI for a full description.  ?Event was most likely secondary to hypotension in the setting of GIB.  ?- Will need EEG and orthostatics.  ? ?Recommendations: ?- Stat rEEG ?- Orthostatics.  ?-  Management of GIB per primary team ?- No indication for additional Keppra unless EEG shows evidence for a seizure focus.   ? ?I have seen and examined the patient. I have formulated the assessment and recommendations. The Neurology NP contributed to documentation of the patient's HPI.  ?Electronically signed: Dr. Kerney Elbe ? ? ?

## 2022-03-15 NOTE — Hospital Course (Addendum)
56 y.o. male with medical history significant for hypertension, asthma, iron deficiency, chronic alcohol abuse, liver cirrhosis, chronic tobacco abuse, arthritis and seasonal allergies who presents to the ED due to having dark maroon stool.  Patient was admitted from 4/3 through 4/7 due to GI bleed.  He had an EGD done on 03/10/2022 which showed none bleeding deep duodenal ulcer with circumferential mass at the base concerning for malignancy with biopsy still pending at this time.  He was discharged home yesterday with hemoglobin of 7.6.  He was reported to have several episodes of slight bright red stained stool at home with possible orthostatic changes especially when patient goes from lying in bed to standing, so it was decided for patient to be brought back to the ED on the morning of 4/8.  Plan was to transfuse 1 unit PRBC and discharg home, but unfortunately he became hypotensive and had a syncopal episode and was also noted to have dark red blood clots per rectum.  He was given 1 L normal saline and 2 more units of PRBCs and has remained stable overnight with fairly stable hemoglobin levels noted.  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  GI has spoken with IR and patient will need transfer for coil embolization. ?Transferred from [facility] to ICU 4/9, embolized and completed. ?Patient was treated for acute blood loss anemia with acute upper GI bleeding from peptic ulcer and duodenal, with pseudoaneurysm at the base of the duodenal lesion, S/P embolization pathology negative for malignancy.  ? ? ?

## 2022-03-15 NOTE — Progress Notes (Signed)
Patient had a seizure like episode lasting < 14mns.  His sister came to the nurses station and stated the patient needed to use the restroom. When RN went into his room,  his HR was up to the mid 120s, he was unresponsive with gaze shifted to the left.  His body was stiff with left hand contracted. He was then drowsy and slightly confused and unaware of the episode.  He is currently responsive, and oriented x 4. MD notified and saw patient at bedside. Orders placed.  ?

## 2022-03-15 NOTE — TOC Initial Note (Signed)
?  Transition of Care (TOC) Screening Note ? ? ?Patient Details  ?Name: Wesley Francis ?Date of Birth: [birth year] ? ? ?Transition of Care (TOC) CM/SW Contact:    ?Kerin Salen, RN ?Phone Number: ?03/15/2022, 12:03 PM ? ? ? ?Transition of Care Department [facility]) has reviewed patient and no TOC needs have been identified at this time. We will continue to monitor patient advancement through interdisciplinary progression rounds. If new patient transition needs arise, please place a TOC consult. ? ?            ? ? ?  ?  ? ? ?Patient Goals and CMS Choice ?  ?  ?  ? ?Expected Discharge Plan and Services ?  ?  ?  ?  ?  ?                ?  ?  ?  ?  ?  ?  ?  ?  ?  ?  ? ?Prior Living Arrangements/Services ?  ?  ?  ?       ?  ?  ?  ?  ? ?Activities of Daily Living ?Home Assistive Devices/Equipment: Gilford Rile (specify type) ?ADL Screening (condition at time of admission) ?Patient's cognitive ability adequate to safely complete daily activities?: Yes ?Is the patient deaf or have difficulty hearing?: No ?Does the patient have difficulty seeing, even when wearing glasses/contacts?: No ?Does the patient have difficulty concentrating, remembering, or making decisions?: No ?Patient able to express need for assistance with ADLs?: Yes ?Does the patient have difficulty dressing or bathing?: No ?Independently performs ADLs?: Yes (appropriate for developmental age) ?Does the patient have difficulty walking or climbing stairs?: No ?Weakness of Legs: None ?Weakness of Arms/Hands: None ? ?Permission Sought/Granted ?  ?  ?   ?   ?   ?   ? ?Emotional Assessment ?  ?  ?  ?  ?  ?  ? ?Admission diagnosis:  Rectal bleeding [K62.5] ?Symptomatic anemia [D64.9] ?Anemia, unspecified type [D64.9] ?Patient Active Problem List  ? Diagnosis Date Noted  ? Syncope 03/15/2022  ? Rectal bleeding   ? Hemorrhagic shock ([place])   ? Symptomatic anemia 03/14/2022  ? Duodenal ulcer   ? Hypokalemia 03/10/2022  ?  Class: Acute  ? Pneumobilia   ? GIB (gastrointestinal  bleeding) 03/09/2022  ? Leukocytosis 03/09/2022  ?  Class: Acute  ? Iron deficiency anemia due to chronic blood loss 03/09/2022  ? Hypotension 03/09/2022  ?  Class: Acute  ? ALC (alcoholic liver cirrhosis) ([place]) 03/09/2022  ?  Class: Chronic  ? Lactic acidosis 12/20/2017  ? Hypoglycemia 12/20/2017  ? Acute metabolic encephalopathy 93/79/0240  ? Hypothermia 12/20/2017  ? HTN (hypertension) 12/20/2017  ? Alcohol intoxication ([place])   ? Heme positive stool 04/05/2016  ? Alcohol abuse 04/04/2016  ? Tobacco abuse 04/04/2016  ? Chronic low back pain 04/04/2016  ? Chronic leg pain 04/04/2016  ? ?PCP:  Jerel Shepherd, FNP ?Pharmacy:   ?[facility] [address] [facility] ?[place] [address] ?Phone: [phone] Fax: [phone] ? ? ? ? ?Social Determinants of Health (SDOH) Interventions ?  ? ?Readmission Risk Interventions ?   ? View : No data to display.  ?  ?  ?  ? ? ? ?

## 2022-03-15 NOTE — Consult Note (Addendum)
?  ?Chief Complaint: Patient was seen in consultation today for GI bleeding. ?  ?Referring Physician(s): Hurshel Keys, OD ?  ?Supervising Physician: Markus Daft ?  ?Patient Status: [facility] inpatient, being transferred to [facility] for IR procedure ?  ?History of Present Illness: ?Wesley Francis is a 56 y.o. male with a past medical history significant for HTN, anemia, AKI, ETOH abuse, recent GI bleed who presented to [facility] ED 4/8 with complaints of dark stools. He had previously been admitted to [facility] from 03/09/22 to 03/13/22 for GI bleeding, he underwent EGD 4/4 which showed congested mucosa of the pylorus, biopsies with reactive gastropathy and 1 large deeply crated ulcer in the duodenum where the ampulla should be located with a circumferential mass present at the base of the ulcer. He was discharged on 4/7 with hgb of 7.6 and resolution of symptoms. On presented to the ED the following evening he was found to have hgb 7.5 for which he received 1 unit of pRBCs. Shortly after the completing the transfusion he became hypotensive and had a syncopal episode with several bloody bowel movements. GI at [facility] was contacted and CTA was recommended, this was performed at 0325 this morning and showed: ?  ?8 mm pseudoaneurysm at the base of the patient's duodenal lesion (ulcer versus complicated diverticulum versus malignancy). ?High-density in the lumen of the proximal small bowel from recent bleeding, but no active bleeding at time of scan. ?  ?He received 2 more units of pRBCs overnight and was overall stable this morning, however around 10:30 am he experienced a seizure-like episode, multiple bloody bowel movements and hypotension. He has since received 2 additional units of pRBCs and IR has been consulted for angiogram with possible embolization. ?  ?  ?    ?Past Medical History:  ?Diagnosis Date  ? AKI (acute kidney injury) ([place]) 04/04/2016  ? Anemia    ? Arthritis    ? Asthma    ? ETOH abuse    ? Folate deficiency 04/05/2016  ?  Heme positive stool 04/05/2016  ? Hypertension    ?  ?  ?     ?Past Surgical History:  ?Procedure Laterality Date  ? BIOPSY   03/10/2022  ?  Procedure: BIOPSY;  Surgeon: Harvel Quale, MD;  Location: [facility] ENDO SUITE;  Service: Gastroenterology;;  ? COLONOSCOPY      ? COLONOSCOPY WITH PROPOFOL N/A 03/02/2017  ?  Procedure: COLONOSCOPY WITH PROPOFOL;  Surgeon: Lollie Sails, MD;  Location: [facility] ENDOSCOPY;  Service: Endoscopy;  Laterality: N/A;  ? COLONOSCOPY WITH PROPOFOL N/A 07/08/2020  ?  Procedure: COLONOSCOPY WITH PROPOFOL;  Surgeon: Lesly Rubenstein, MD;  Location: [facility] ENDOSCOPY;  Service: Endoscopy;  Laterality: N/A;  ? ESOPHAGOGASTRODUODENOSCOPY (EGD) WITH PROPOFOL N/A 07/08/2020  ?  Procedure: ESOPHAGOGASTRODUODENOSCOPY (EGD) WITH PROPOFOL;  Surgeon: Lesly Rubenstein, MD;  Location: [facility] ENDOSCOPY;  Service: Endoscopy;  Laterality: N/A;  ? ESOPHAGOGASTRODUODENOSCOPY (EGD) WITH PROPOFOL N/A 03/10/2022  ?  Procedure: ESOPHAGOGASTRODUODENOSCOPY (EGD) WITH PROPOFOL;  Surgeon: Harvel Quale, MD;  Location: [facility] ENDO SUITE;  Service: Gastroenterology;  Laterality: N/A;  ? FRACTURE SURGERY      ? KNEE ARTHROCENTESIS      ?  ?  ?Allergies: ?Patient has no known allergies. ?  ?Medications: ?       ?Prior to Admission medications   ?Medication Sig Start Date End Date Taking? Authorizing Provider  ?acetaminophen (TYLENOL) 325 MG tablet Take 650 mg by mouth every 6 (six) hours as needed for  mild pain.     Yes [provider]  ?albuterol (PROVENTIL HFA;VENTOLIN HFA) 108 (90 Base) MCG/ACT inhaler Inhale 2 puffs into the lungs every 6 (six) hours as needed for wheezing or shortness of breath. 04/06/16   Yes Rexene Alberts, MD  ?Cholecalciferol (VITAMIN D) 2000 units CAPS Take 1 capsule by mouth every other day.     Yes [provider]  ?metoCLOPramide (REGLAN) 10 MG tablet Take 1 tablet (10 mg total) by mouth 3 (three) times daily as needed (hiccups). 02/15/22   Yes Harris, Abigail, PA-C   ?pantoprazole (PROTONIX) 40 MG tablet Take 1 tablet (40 mg total) by mouth 2 (two) times daily. 03/13/22 04/12/22 Yes Shah, Pratik D, DO  ?sucralfate (CARAFATE) 1 GM/10ML suspension Take 10 mLs (1 g total) by mouth 4 (four) times daily -  with meals and at bedtime. 03/13/22   Yes Manuella Ghazi, Pratik D, DO  ?thiamine 100 MG tablet Take 1 tablet (100 mg total) by mouth daily. 12/23/17   Yes Manuella Ghazi, Pratik D, DO  ?  ?  ?     ?Family History  ?Problem Relation Age of Onset  ? Hypertension Mother    ? Stroke Mother    ? Hypertension Father    ?  ?  ?Social History  ?  ?     ?Socioeconomic History  ? Marital status: Single  ?    Spouse name: Not on file  ? Number of children: Not on file  ? Years of education: Not on file  ? Highest education level: Not on file  ?Occupational History  ? Not on file  ?Tobacco Use  ? Smoking status: Every Day  ?    Packs/day: 0.50  ?    Types: Cigarettes  ? Smokeless tobacco: Never  ?Vaping Use  ? Vaping Use: Never used  ?Substance and Sexual Activity  ? Alcohol use: Yes  ?    Alcohol/week: 4.0 standard drinks  ?    Types: 4 Cans of beer per week  ?    Comment: yesterday 03/09/22  ? Drug use: No  ? Sexual activity: Not on file  ?Other Topics Concern  ? Not on file  ?Social History Narrative  ? Not on file  ?  ?Social Determinants of Health  ?  ?Financial Resource Strain: Not on file  ?Food Insecurity: Not on file  ?Transportation Needs: Not on file  ?Physical Activity: Not on file  ?Stress: Not on file  ?Social Connections: Not on file  ?  ?  ?  ?Review of Systems: A 12 point ROS discussed and pertinent positives are indicated in the HPI above.  All other systems are negative. ?  ?Review of Systems ?  ?Vital Signs: ?BP 114/62   Pulse (!) 121   Temp (!) 97.1 ?F (36.2 ?C) (Axillary)   Resp 12   Ht '5\' 6"'$  (1.676 m)   Wt 137 lb 12.6 oz (62.5 kg)   SpO2 100%   BMI 22.24 kg/m?  ?  ?Physical Exam ?  ?General: Resting comfortable in bed ?Heart: RRR ?Lungs: CTA ?Abdomen: soft, non tender ? ? Mallampati: 2 ?   ?Imaging: ? ?Imaging Results  ?CT ABDOMEN WO CONTRAST ?  ?Result Date: 03/09/2022 ?CLINICAL DATA:  Acute abdominal pain. Rectal bleeding. Questionable duodenal perforation. EXAM: CT ABDOMEN WITHOUT CONTRAST TECHNIQUE: Multidetector CT imaging of the abdomen was performed following the standard protocol without IV contrast. RADIATION DOSE REDUCTION: This exam was performed according to the departmental dose-optimization program which includes  automated exposure control, adjustment of the mA and/or kV according to patient size and/or use of iterative reconstruction technique. COMPARISON:  CTA abdomen and pelvis 03/09/2022. FINDINGS: Lower chest: There is atelectasis or scarring in the lung bases. Hepatobiliary: Again seen is air within the gallbladder, common bile duct and left intrahepatic ducts similar to the prior study. No gallstones are identified. Liver otherwise appears within normal limits. Pancreas: Enlargement of the pancreatic head with surrounding inflammation is similar to the prior study. There is air within the pancreatic head, also unchanged. Pancreatic body and tail are within normal limits. Spleen: Normal in size without focal abnormality. Adrenals/Urinary Tract: There is a 15 mm cyst in the superior pole the right kidney. Additional hypodensity in the left kidney is too small to characterize, but also likely a cyst. There is no hydronephrosis. Stomach/Bowel: Oral contrast is seen throughout the stomach and mid small bowel loops. Homogeneous density noted within the stomach lumen measuring 7.3 x 9.2 x 4.1 cm which may represent hematoma. Stomach is nondilated. There is no extravasation of oral contrast in the region of the duodenum. Duodenal wall thickening and inflammation versus there is no free air in this region. Visualized small bowel loops are otherwise within normal limits. Vascular/Lymphatic: No significant vascular findings are present. No enlarged abdominal or pelvic lymph nodes. Other: No  ascites or free air.  No focal abdominal wall hernia. Musculoskeletal: No acute or significant osseous findings. IMPRESSION: 1. Again seen is inflammation and wall thickening of the duodenum with enlargement

## 2022-03-15 NOTE — Assessment & Plan Note (Signed)
Echocardiogram 4/9 consistent with possible left ventricular hypertrophy with S.A.M. and intracavitary gradient.  No compatible murmur on examination ? ?-Avoid hypovolemia ?-Consider CMR as outpatient to better assess ?

## 2022-03-15 NOTE — Progress Notes (Signed)
?PROGRESS NOTE ? ? ? ?Wesley Francis  YQI:[id number] DOB: [birth year] DOA: 03/14/2022 ?PCP: Jerel Shepherd, FNP ? ? ?Brief Narrative:  ?Wesley Francis is a 56 y.o. male with medical history significant for hypertension, asthma, iron deficiency, chronic alcohol abuse, liver cirrhosis, chronic tobacco abuse, arthritis and seasonal allergies who presents to the emergency department due to having dark maroon stool.  Patient was admitted from 4/3 through 4/7 due to GI bleed.  He had an EGD done on 03/10/2022 which showed none bleeding deep duodenal ulcer with circumferential mass at the base concerning for malignancy with biopsy still pending at this time.  He was discharged home yesterday with hemoglobin of 7.6.  He was reported to have several episodes of slight bright red stained stool at home with possible orthostatic changes especially when patient goes from lying in bed to standing, so it was decided for patient to be brought back to the ED on the morning of 4/8.  Plan was to transfuse 1 unit PRBC and discharged home, but unfortunately he became hypotensive and had a syncopal episode and was also noted to have dark red blood clots per rectum.  He was given 1 L normal saline and 2 more units of PRBCs and has remained stable overnight with fairly stable hemoglobin levels noted.  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  GI has spoken with IR and patient will need transfer for coil embolization. ? ?-During the course of the day, he has had an episode of unresponsiveness with leftward gaze and full body contraction that appeared to be a possible seizure versus syncopal episode.  Neurology contacted and has recommended Keppra loading dose for now as well as CT head and EEG for further evaluation.  He subsequently developed hypotension with 4 large bloody bowel movements.  He will now require IV fluid boluses as well as 2 more unit PRBC  transfusion, FFP, and platelets.  Spoke with PCCM who is agreeable to transfer to ICU at [facility] for further stabilization until IR can perform coil embolization. ?  ?Assessment & Plan: ?  ?Principal Problem: ?  Symptomatic anemia ?Active Problems: ?  Alcohol abuse ?  ALC (alcoholic liver cirrhosis) ([place]) ?  Tobacco abuse ?  Syncope ?  Rectal bleeding ?  Hemorrhagic shock ([place]) ? ?Assessment and Plan: ? ? ?GI bleed with hemorrhagic shock ?-Patient has been transfused a total of 3 units PRBCs on 4/8 and will now require 2 more units given drop in hemoglobin as well as hypotension ?-Fluid boluses to be administered, not currently requiring pressors ?-Plan to transfer to [facility], ICU for close monitoring and need for IR to perform coil immunization ?-IR and GI aware, discussed case with PCCM and will transfer from [facility] to [facility] ICU once bed available ? ?Acute syncopal episode with possible seizure activity ?This is possibly secondary to orthostatic hypotension due to significant drop in SBP when patient got up from bed. ?2D echocardiogram pending ?Noted to have possible seizure activity on 4/9 for which neurology has recommended Keppra bolus and initiation of Keppra 750 mg twice daily ?CT head and EEG pending ?Discussed case with Dr. Cheral Marker who will see upon arrival ?  ?Alcoholic liver cirrhosis ?Patient has a long history of alcohol abuse, he states that he has not had any alcoholic drink in the last 2 weeks ?Patient follows with liver specialist at [facility] with last visit being on  3/30 ?  ?Essential hypertension-currently hypotensive ?Patient had hypotensive episode with due to GI bleed and orthostatics ?BP meds will be held at this time ?  ?Alcohol abuse ?Patient has history of chronic heavy alcohol abuse ( > 20 years), he states that he has not had any alcohol consumption in the last 2 weeks ?Patient was counseled to continue to abstain from alcohol consumption ?  ?Tobacco abuse ?Patient was counseled on  smoking cessation ?Nicotine patch will be provided ?  ?  ?DVT prophylaxis:SCDs ?Code Status: Full ?Family Communication: Sisters at bedside 4/9 ?Disposition Plan:  ?Status is: Inpatient ?Remains inpatient appropriate because: IV medications and need for inpatient procedure. ? ?Consultants:  ?GI ?IR, Dr. Anselm Pancoast notified 4/9 ?Neurology, Dr. Cheral Marker ? ?Procedures:  ?See below ? ?Antimicrobials:  ?None ? ? ?Subjective: ?Patient seen and evaluated today this morning with no new complaints or concerns.  He has not had any significant bleeding overnight.  Unfortunately, he had a brief episode of unresponsiveness that appeared to be seizure-like activity with whole body contraction and leftward gaze.  He was slightly postictal afterwards, but regained his baseline mentation.  Several minutes later he developed hypotension along with 4 large bloody bowel movements.  His blood pressures have improved after fluid bolus. ? ?Objective: ?Vitals:  ? 03/15/22 1158 03/15/22 1200 03/15/22 1215 03/15/22 1300  ?BP: (!) 104/59 (!) 104/59 (!) 90/52 114/62  ?Pulse: (!) 110   (!) 121  ?Resp: '13 17 19 12  '$ ?Temp: (!) 97.1 ?F (36.2 ?C)     ?TempSrc: Axillary     ?SpO2: 95%   100%  ?Weight:      ?Height:      ? ? ?Intake/Output Summary (Last 24 hours) at 03/15/2022 1348 ?Last data filed at 03/15/2022 1330 ?Gross per 24 hour  ?Intake 2102.08 ml  ?Output 1050 ml  ?Net 1052.08 ml  ? ?Filed Weights  ? 03/14/22 0727 03/15/22 0500  ?Weight: 60.8 kg 62.5 kg  ? ? ?Examination: ? ?General exam: Appears calm and comfortable  ?Respiratory system: Clear to auscultation. Respiratory effort normal. ?Cardiovascular system: S1 & S2 heard, RRR.  ?Gastrointestinal system: Abdomen is soft ?Central nervous system: Alert and awake ?Extremities: No edema ?Skin: No significant lesions noted ?Psychiatry: Flat affect. ? ? ? ?Data Reviewed: I have personally reviewed following labs and imaging studies ? ?CBC: ?Recent Labs  ?Lab 03/09/22 ?0955 03/09/22 ?2306 03/12/22 ?0409  03/13/22 ?0406 03/14/22 ?7619 03/14/22 ?1434 03/15/22 ?5093 03/15/22 ?1101  ?WBC 17.8*   < > 6.6 6.9 9.5 8.3 6.9  --   ?NEUTROABS 15.7*  --   --   --  7.8* 5.7  --   --   ?HGB 7.4*   < > 7.9* 7.6* 7.5* 7.1* 7.8* 6.7*  ?HCT 24.3*   < > 24.3* 24.5* 25.0* 22.3* 24.4* 21.4*  ?MCV 91.0   < > 90.0 90.4 92.3 90.7 88.7  --   ?PLT 270   < > 264 284 324 315 204  --   ? < > = values in this interval not displayed.  ? ?Basic Metabolic Panel: ?Recent Labs  ?Lab 03/11/22 ?0405 03/12/22 ?0409 03/13/22 ?0406 03/14/22 ?2671 03/15/22 ?2458  ?NA 138 137 136 137 138  ?K 3.3* 3.5 3.8 4.5 4.3  ?CL 109 110 109 106 113*  ?CO2 23 21* 21* 21* 20*  ?GLUCOSE 103* 96 95 91 95  ?BUN 10 5* 6 24* 21*  ?CREATININE 0.92 0.83 0.96 1.05 0.86  ?CALCIUM 7.9* 8.1* 8.4* 9.0 7.7*  ?  MG  --  1.4* 1.9  --  1.6*  ?PHOS  --   --   --   --  3.4  ? ?GFR: ?Estimated Creatinine Clearance: 84.8 mL/min (by C-G formula based on SCr of 0.86 mg/dL). ?Liver Function Tests: ?Recent Labs  ?Lab 03/10/22 ?9093 03/11/22 ?0405 03/12/22 ?0409 03/14/22 ?1121 03/15/22 ?6244  ?AST '22 17 16 31 22  '$ ?ALT 39 30 25 33 23  ?ALKPHOS 134* 110 102 225* 166*  ?BILITOT 2.9* 2.1* 1.9* 1.9* 2.0*  ?PROT 5.3* 4.9* 4.9* 6.3* 4.1*  ?ALBUMIN 2.7* 2.5* 2.4* 3.1* 2.1*  ? ?Recent Labs  ?Lab 03/09/22 ?6950  ?LIPASE 49  ? ?No results for input(s): AMMONIA in the last 168 hours. ?Coagulation Profile: ?Recent Labs  ?Lab 03/09/22 ?7225 03/10/22 ?0357 03/14/22 ?7505  ?INR 1.1 1.1 1.1  ? ?Cardiac Enzymes: ?No results for input(s): CKTOTAL, CKMB, CKMBINDEX, TROPONINI in the last 168 hours. ?BNP (last 3 results) ?No results for input(s): PROBNP in the last 8760 hours. ?HbA1C: ?No results for input(s): HGBA1C in the last 72 hours. ?CBG: ?Recent Labs  ?Lab 03/10/22 ?0716 03/12/22 ?0738 03/13/22 ?0720 03/15/22 ?1034  ?GLUCAP 88 110* 93 90  ? ?Lipid Profile: ?No results for input(s): CHOL, HDL, LDLCALC, TRIG, CHOLHDL, LDLDIRECT in the last 72 hours. ?Thyroid Function Tests: ?No results for input(s): TSH, T4TOTAL,  FREET4, T3FREE, THYROIDAB in the last 72 hours. ?Anemia Panel: ?No results for input(s): VITAMINB12, FOLATE, FERRITIN, TIBC, IRON, RETICCTPCT in the last 72 hours. ?Sepsis Labs: ?No results for input(s): PROC

## 2022-03-15 NOTE — Progress Notes (Signed)
Was called by hospitalist Dr. Manuella Ghazi due to change in clinical condition.  Patient reportedly had multiple red bowel movements, suffered seizure-like activity, blood pressure dropped to systolic in the 16X.   ? ?On my interview exam, patient has responded well to IV fluids.  Responsive to my questions systolic back up to 096.  2 more units of PRBCs ordered.  Would recommend platelets and FFP as well. ? ?Discussed case with Dr. Constance Haw, no role for surgical intervention at present. ? ?As per consult note, I discussed case with IR Dr. Anselm Pancoast this morning who agrees that patient needs angiographic treatment of his GI bleed.  Transfer process has been started by Dr. Manuella Ghazi.  Hopeful transfer once stabilized. ? ?Discussed everything with patient and all of his family members as well.  All questions and concerns have been answered.  I did counsel them that patient is unstable at present.  They understand. ? ?

## 2022-03-15 NOTE — Assessment & Plan Note (Signed)
Had another large bloody melanotic stool today with nadir hemoglobin 6.7. ?Blood pressure has improved with 2 units PRBC.  Platelet and fresh frozen plasma transfusion pending. ? ?-Transfuse as needed ?

## 2022-03-15 NOTE — Procedures (Signed)
Interventional Radiology Procedure: ? ? ?Indications: GI bleed with large duodenal ulcer at ampullary region.  Pseudoaneurysm at ulceration region on CTA. ? ?Procedure: 1) Placement of left arm PICC 2) Mesenteric arteriogram with coil embolization of the pseudoaneurysm and feeding vessel ? 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  Microcatheter was unable to be advanced beyond the pseudoaneurysm, therefore, the pseudoaneurysm itself was embolized with coils and inflow artery was embolized with a coil.  No definite filling of the pseudoaneurysm at end of procedure.  ? ?Left brachial vein dual lumen PICC, length 42 cm, tip at SVC/RA junction.  ? ?Complications: No immediate complications noted. ?    ?EBL: Minimal ? ?Plan: Bedrest 4 hours due to arterial puncture.  Closure device failed to deploy and manual compression was held for hemostasis.   ? ? ?Tonatiuh Mallon R. Anselm Pancoast, MD  ?Pager: [phone] ? ? ? ?  ?

## 2022-03-15 NOTE — Assessment & Plan Note (Signed)
Syncope associated with mild seizure like activity from transient hypoperfusion. Neurologically intact at present.  ? ?- Stop AED's and not need for EEG ?

## 2022-03-15 NOTE — Assessment & Plan Note (Signed)
Seen by hepatology at [facility]. Elevated AST/ALT but morphology on [place] not consistent with cirrhosis.  ? ?- Avoid hepatotoxins ?

## 2022-03-15 NOTE — Sedation Documentation (Signed)
Patient transported to 2 Heart ICU. Ben RN at the bedside to receive patient. Groin site intact. Soft to palpation, no hematoma noted. +1 distal pulses intact.  ?

## 2022-03-15 NOTE — Consult Note (Addendum)
?Consulting  Provider: Dr. Josephine Cables ?Primary Care Physician:  Jerel Shepherd, [place] ?Primary Gastroenterologist:  Dr. Alice Reichert ([place]) ? ?Reason for Consultation: Acute GI bleeding ? ?HPI:  ?Wesley Francis is a 56 y.o. male with a past medical history of chronic alcohol abuse, hypertension, asthma, iron deficiency, who presented to [facility], ER yesterday due to having dark stools. ? ?Patient discharged from [facility] on 03/13/22 after a 4-day stay for GI bleeding, underwent EGD 03/10/2022  which showed congested mucosa of the pylorus, biopsies with reactive gastropathy.  1 large deeply cratered ulcer in the duodenum where the ampulla should be located, approximately 30 mm in largest dimension, circumferential, some food debris noted.  EGD notes rounded circumferential mass present at the base of the ulcer partially covered with fibrin, questionable adherent clot.   ? ?Case was discussed with advanced endoscopy in [facility] who recommended repeat CT scan in 6 weeks as well as repeat EGD in 8 to 10 weeks pending clinical course. ? ?Patient was discharged 03/13/22 after completing 72 hours of PPI infusion.  Hemoglobin was stable at 7.6.  Abdominal pain had resolved.  Was tolerating diet. ? ?Patient presented back to our ER 03/14/22 with melena.  Hemoglobin was 7.5.  He did receive 1 unit PRBCs.  After completing transfusion he reportedly became hypotensive and had a syncopal episode with multiple bloody bowel movements. ? ?I was contacted by ER yesterday evening after this and I recommended stat CTA once stable enough.   2 more units of PRBCs were ordered.  Patient underwent CTA 3:25 AM which showed 8 mm GDA pseudoaneurysm at the base of patient's duodenal lesion. ? ?Hemoglobin this morning 7.8.  Blood pressure improved and stable. ? ?Past Medical History:  ?Diagnosis Date  ? AKI (acute kidney injury) ([place]) 04/04/2016  ? Anemia   ? Arthritis   ? Asthma   ? ETOH abuse   ? Folate deficiency 04/05/2016  ? Heme  positive stool 04/05/2016  ? Hypertension   ? ? ?Past Surgical History:  ?Procedure Laterality Date  ? BIOPSY  03/10/2022  ? Procedure: BIOPSY;  Surgeon: Harvel Quale, MD;  Location: [facility] ENDO SUITE;  Service: Gastroenterology;;  ? COLONOSCOPY    ? COLONOSCOPY WITH PROPOFOL N/A 03/02/2017  ? Procedure: COLONOSCOPY WITH PROPOFOL;  Surgeon: Lollie Sails, MD;  Location[facility] ENDOSCOPY;  Service: Endoscopy;  Laterality: N/A;  ? COLONOSCOPY WITH PROPOFOL N/A 07/08/2020  ? Procedure: COLONOSCOPY WITH PROPOFOL;  Surgeon: Lesly Rubenstein, MD;  Location: [facility] ENDOSCOPY;  Service: Endoscopy;  Laterality: N/A;  ? ESOPHAGOGASTRODUODENOSCOPY (EGD) WITH PROPOFOL N/A 07/08/2020  ? Procedure: ESOPHAGOGASTRODUODENOSCOPY (EGD) WITH PROPOFOL;  Surgeon: Lesly Rubenstein, MD;  Location: [facility] ENDOSCOPY;  Service: Endoscopy;  Laterality: N/A;  ? ESOPHAGOGASTRODUODENOSCOPY (EGD) WITH PROPOFOL N/A 03/10/2022  ? Procedure: ESOPHAGOGASTRODUODENOSCOPY (EGD) WITH PROPOFOL;  Surgeon: Harvel Quale, MD;  Location: [facility] ENDO SUITE;  Service: Gastroenterology;  Laterality: N/A;  ? FRACTURE SURGERY    ? KNEE ARTHROCENTESIS    ? ? ?Prior to Admission medications   ?Medication Sig Start Date End Date Taking? Authorizing Provider  ?acetaminophen (TYLENOL) 325 MG tablet Take 650 mg by mouth every 6 (six) hours as needed for mild pain.   Yes [provider]  ?albuterol (PROVENTIL HFA;VENTOLIN HFA) 108 (90 Base) MCG/ACT inhaler Inhale 2 puffs into the lungs every 6 (six) hours as needed for wheezing or shortness of breath. 04/06/16  Yes Rexene Alberts, MD  ?Cholecalciferol (VITAMIN D) 2000 units CAPS Take  1 capsule by mouth every other day.   Yes [provider]  ?metoCLOPramide (REGLAN) 10 MG tablet Take 1 tablet (10 mg total) by mouth 3 (three) times daily as needed (hiccups). 02/15/22  Yes Harris, Abigail, PA-C  ?pantoprazole (PROTONIX) 40 MG tablet Take 1 tablet (40 mg total) by mouth 2 (two) times daily. 03/13/22  04/12/22 Yes Shah, Pratik D, DO  ?sucralfate (CARAFATE) 1 GM/10ML suspension Take 10 mLs (1 g total) by mouth 4 (four) times daily -  with meals and at bedtime. 03/13/22  Yes Manuella Ghazi, Pratik D, DO  ?thiamine 100 MG tablet Take 1 tablet (100 mg total) by mouth daily. 12/23/17  Yes Manuella Ghazi, Pratik D, DO  ? ? ?Current Facility-Administered Medications  ?Medication Dose Route Frequency Provider Last Rate Last Admin  ? norepinephrine (LEVOPHED) 4-5 MG/250ML-% infusion SOLN           ? 0.9 %  sodium chloride infusion (Manually program via Guardrails IV Fluids)   Intravenous Once Manuella Ghazi, Pratik D, DO      ? Chlorhexidine Gluconate Cloth 2 % PADS 6 each  6 each Topical Daily Adefeso, Oladapo, DO   6 each at 03/15/22 0739  ? lactated ringers bolus 2,000 mL  2,000 mL Intravenous Once Manuella Ghazi, Pratik D, DO      ? lactated ringers infusion   Intravenous Continuous Heath Lark D, DO 75 mL/hr at 03/15/22 0739 New Bag at 03/15/22 0739  ? levETIRAcetam (KEPPRA) 2,000 mg in sodium chloride 0.9 % 250 mL IVPB  2,000 mg Intravenous Once Manuella Ghazi, Pratik D, DO      ? levETIRAcetam (KEPPRA) 750 mg in sodium chloride 0.9 % 100 mL IVPB  750 mg Intravenous Q12H Manuella Ghazi, Pratik D, DO      ? LORazepam (ATIVAN) injection 2 mg  2 mg Intravenous Q4H PRN Manuella Ghazi, Pratik D, DO      ? nicotine (NICODERM CQ - dosed in mg/24 hours) patch 21 mg  21 mg Transdermal Daily Adefeso, Oladapo, DO   21 mg at 03/15/22 0741  ? norepinephrine (LEVOPHED) '4mg'$  in 24m (0.016 mg/mL) premix infusion  0-40 mcg/min Intravenous Titrated SManuella Ghazi Pratik D, DO      ? [START ON 03/18/2022] pantoprazole (PROTONIX) injection 40 mg  40 mg Intravenous Q12H Shah, Pratik D, DO      ? pantoprozole (PROTONIX) 80 mg /NS 100 mL infusion  8 mg/hr Intravenous Continuous Shah, Pratik D, DO      ? sucralfate (CARAFATE) 1 GM/10ML suspension 1 g  1 g Oral TID WC & HS Adefeso, Oladapo, DO   1 g at 03/14/22 2134  ? thiamine tablet 100 mg  100 mg Oral Daily Adefeso, Oladapo, DO   100 mg at 03/14/22 1004  ? ? ?Allergies  as of 03/14/2022  ? (No Known Allergies)  ? ? ?Family History  ?Problem Relation Age of Onset  ? Hypertension Mother   ? Stroke Mother   ? Hypertension Father   ? ? ?Social History  ? ?Socioeconomic History  ? Marital status: Single  ?  Spouse name: Not on file  ? Number of children: Not on file  ? Years of education: Not on file  ? Highest education level: Not on file  ?Occupational History  ? Not on file  ?Tobacco Use  ? Smoking status: Every Day  ?  Packs/day: 0.50  ?  Types: Cigarettes  ? Smokeless tobacco: Never  ?Vaping Use  ? Vaping Use: Never used  ?Substance and Sexual Activity  ?  Alcohol use: Yes  ?  Alcohol/week: 4.0 standard drinks  ?  Types: 4 Cans of beer per week  ?  Comment: yesterday 03/09/22  ? Drug use: No  ? Sexual activity: Not on file  ?Other Topics Concern  ? Not on file  ?Social History Narrative  ? Not on file  ? ?Social Determinants of Health  ? ?Financial Resource Strain: Not on file  ?Food Insecurity: Not on file  ?Transportation Needs: Not on file  ?Physical Activity: Not on file  ?Stress: Not on file  ?Social Connections: Not on file  ?Intimate Partner Violence: Not on file  ? ? ?Review of Systems: ?General: Negative for anorexia, weight loss, fever, chills, positive for weakness and "feeling hot" ?Eyes: Negative for vision changes.  ?ENT: Negative for hoarseness, difficulty swallowing , nasal congestion. ?CV: Negative for chest pain, angina, palpitations, dyspnea on exertion, peripheral edema.  ?Respiratory: Negative for dyspnea at rest, dyspnea on exertion, cough, sputum, wheezing.  ?GI: See history of present illness. ?GU:  Negative for dysuria, hematuria, urinary incontinence, urinary frequency, nocturnal urination.  ?MS: Negative for joint pain, low back pain.  ?Derm: Negative for rash or itching.  ?Neuro: Negative for weakness, abnormal sensation, seizure, frequent headaches, memory loss, confusion.  ?Psych: Negative for anxiety, depression ?Endo: Negative for unusual weight  change.  ?Heme: Negative for bruising or bleeding. ?Allergy: Negative for rash or hives. ? ?Physical Exam: ?Vital signs in last 24 hours: ?Temp:  [97.9 ?F (36.6 ?C)-98.8 ?F (37.1 ?C)] 98.4 ?F (36.9 ?C) (04/

## 2022-03-15 NOTE — Sedation Documentation (Signed)
Closure device failed to deploy ?

## 2022-03-16 ENCOUNTER — Inpatient Hospital Stay (HOSPITAL_COMMUNITY): Payer: Medicaid Other

## 2022-03-16 ENCOUNTER — Encounter: Payer: Self-pay | Admitting: Internal Medicine

## 2022-03-16 DIAGNOSIS — D62 Acute posthemorrhagic anemia: Secondary | ICD-10-CM

## 2022-03-16 DIAGNOSIS — K269 Duodenal ulcer, unspecified as acute or chronic, without hemorrhage or perforation: Secondary | ICD-10-CM

## 2022-03-16 DIAGNOSIS — R404 Transient alteration of awareness: Secondary | ICD-10-CM | POA: Diagnosis not present

## 2022-03-16 DIAGNOSIS — D649 Anemia, unspecified: Secondary | ICD-10-CM | POA: Diagnosis not present

## 2022-03-16 DIAGNOSIS — K922 Gastrointestinal hemorrhage, unspecified: Secondary | ICD-10-CM

## 2022-03-16 LAB — CBC
HCT: 26.4 % — ABNORMAL LOW (ref 39.0–52.0)
HCT: 21.7 % — ABNORMAL LOW (ref 39.0–52.0)
Hemoglobin: 8.7 g/dL — ABNORMAL LOW (ref 13.0–17.0)
Hemoglobin: 7.4 g/dL — ABNORMAL LOW (ref 13.0–17.0)
MCH: 29.1 pg (ref 26.0–34.0)
MCH: 30.1 pg (ref 26.0–34.0)
MCHC: 33 g/dL (ref 30.0–36.0)
MCHC: 34.1 g/dL (ref 30.0–36.0)
MCV: 88.3 fL (ref 80.0–100.0)
MCV: 88.2 fL (ref 80.0–100.0)
Platelets: 170 10*3/uL (ref 150–400)
Platelets: 172 10*3/uL (ref 150–400)
RBC: 2.99 MIL/uL — ABNORMAL LOW (ref 4.22–5.81)
RBC: 2.46 MIL/uL — ABNORMAL LOW (ref 4.22–5.81)
RDW: 14.5 % (ref 11.5–15.5)
RDW: 14.6 % (ref 11.5–15.5)
WBC: 9.6 10*3/uL (ref 4.0–10.5)
WBC: 7.8 10*3/uL (ref 4.0–10.5)
nRBC: 0 % (ref 0.0–0.2)
nRBC: 0 % (ref 0.0–0.2)

## 2022-03-16 LAB — COMPREHENSIVE METABOLIC PANEL
ALT: 20 U/L (ref 0–44)
AST: 19 U/L (ref 15–41)
Albumin: 2 g/dL — ABNORMAL LOW (ref 3.5–5.0)
Alkaline Phosphatase: 109 U/L (ref 38–126)
Anion gap: 6 (ref 5–15)
BUN: 27 mg/dL — ABNORMAL HIGH (ref 6–20)
CO2: 18 mmol/L — ABNORMAL LOW (ref 22–32)
Calcium: 8 mg/dL — ABNORMAL LOW (ref 8.9–10.3)
Chloride: 112 mmol/L — ABNORMAL HIGH (ref 98–111)
Creatinine, Ser: 0.94 mg/dL (ref 0.61–1.24)
GFR, Estimated: >60 mL/min (ref >60)
Glucose, Bld: 95 mg/dL (ref 70–99)
Potassium: 4.2 mmol/L (ref 3.5–5.1)
Sodium: 136 mmol/L (ref 135–145)
Total Bilirubin: 1.5 mg/dL — ABNORMAL HIGH (ref 0.3–1.2)
Total Protein: 3.9 g/dL — ABNORMAL LOW (ref 6.5–8.1)

## 2022-03-16 LAB — BPAM RBC
Blood Product Expiration Date: 202304222359
Blood Product Expiration Date: 202304222359
Blood Product Expiration Date: 202305052359
Blood Product Expiration Date: 202305062359
Blood Product Expiration Date: 202305122359
ISSUE DATE / TIME: 202304081701
ISSUE DATE / TIME: 202304082012
ISSUE DATE / TIME: 202304082338
ISSUE DATE / TIME: 202304091138
ISSUE DATE / TIME: 202304091335
Unit Type and Rh: 5100
Unit Type and Rh: 6200
Unit Type and Rh: 6200
Unit Type and Rh: 6200
Unit Type and Rh: 6200

## 2022-03-16 LAB — TYPE AND SCREEN
ABO/RH(D): A POS
Antibody Screen: NEGATIVE
Unit division: 0
Unit division: 0
Unit division: 0
Unit division: 0
Unit division: 0

## 2022-03-16 LAB — PREPARE FRESH FROZEN PLASMA

## 2022-03-16 LAB — BPAM FFP
Blood Product Expiration Date: 202304142359
Unit Type and Rh: 6200

## 2022-03-16 LAB — MAGNESIUM: Magnesium: 2 mg/dL (ref 1.7–2.4)

## 2022-03-16 MED ORDER — HYDRALAZINE HCL 20 MG/ML IJ SOLN
10.0000 mg | Freq: Four times a day (QID) | INTRAMUSCULAR | Status: DC | PRN
  Administered 2022-03-17: 10 mg via INTRAVENOUS
  Filled 2022-03-16: qty 1

## 2022-03-16 NOTE — Consult Note (Addendum)
WOC Nurse Consult Note: ?Reason for Consult: Consult requested for left great toe.  Pt states this occurred prior to admission.  ?Wound type: Tip of left great toe is 100% dry eschar; 1X3cm.  No odor, fluctuance, or drainage.  It is best practice to leave dry stable eschar in place; no topical treatment is indicated.  ?Dressing procedure/placement/frequency: Orders placed for bedside nurses: Leave eschar to left great toe dry and open to air. ?Please re-consult if further assistance is needed.  Thank-you,  ?Julien Girt MSN, RN, [place], [place], CNS ?[phone]  ? ?  ?

## 2022-03-16 NOTE — Telephone Encounter (Signed)
Please advise diagnosis for CT pancreas protocol. He is currently admitted to ICU at [facility].  ?

## 2022-03-16 NOTE — Progress Notes (Signed)
? ?NAME:  Wesley Francis, MRN:  [id number], DOB:  October 01, 1966, LOS: 2 ?ADMISSION DATE:  03/14/2022, CONSULTATION DATE: 03/15/2022 ?REFERRING MD: [facility], CHIEF COMPLAINT: Recurrent GI bleed ? ?History of Present Illness:  ?57 year old male with a past medical history of alcohol abuse, hypertension, asthma, iron deficiency who presented to [facility] emergency room having dark stools.  He is recently been discharged from [facility] after 3 days.  He presents again with some bloody stools symptomatic anemia he has been fluid resuscitated and transfused with packed cells stabilized and needs to be transferred to [facility] and be seen by interventional radiology for coiling of GI bleed.  Critical care has been asked to admit. ? ?Pertinent  Medical History  ? ?Past Medical History:  ?Diagnosis Date  ? AKI (acute kidney injury) ([place]) 04/04/2016  ? Anemia   ? Arthritis   ? Asthma   ? ETOH abuse   ? Folate deficiency 04/05/2016  ? Heme positive stool 04/05/2016  ? Hypertension   ? ? ? ?Significant Hospital Events: ?Including procedures, antibiotic start and stop dates in addition to other pertinent events   ?03/15/2022 transferred from [facility] to [facility], embolization completed. ? ?Interim History / Subjective:  ?No BM today. Feels well.  ? ?Objective   ?Blood pressure (!) 147/83, pulse 84, temperature 98.3 ?F (36.8 ?C), temperature source Oral, resp. rate 15, height '5\' 6"'$  (1.676 m), weight 62.5 kg, SpO2 98 %. ?   ?   ? ?Intake/Output Summary (Last 24 hours) at 03/16/2022 1344 ?Last data filed at 03/16/2022 0800 ?Gross per 24 hour  ?Intake 2132.51 ml  ?Output 1275 ml  ?Net 857.51 ml  ? ? ?Filed Weights  ? 03/14/22 0727 03/15/22 0500  ?Weight: 60.8 kg 62.5 kg  ? ? ?Examination: ?General: Chronically ill-appearing man in bed no acute distress ?HENT: [place]/AT, eyes anicteric ?Lungs: CTAB, breathing comfortably on room air. ?Cardiovascular: S1-S2, regular rate and rhythm ?Abdomen: Soft, nontender,  nondistended ?Extremities: No clubbing or edema ?Neuro: Awake and alert, answering questions appropriately, normal speech.  Moving all extremities. ? ?H/H 8.7/26.4 ?WBC 9.6 ?Bicarb 18 ?BUN 27 ?Creatinine 0.94 ?Pathology reviewed> no malignancy, no H. pylori ? ?Resolved hospital problem list:  ?Hemorrhagic shock ? ?Assessment & Plan:  ?Acute blood loss anemia due to acute upper GI bleed; Forrest IIb peptic ulcer in duodenum.  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  Pathology negative for malignancy and H pylori. CA19-9 elevated; not sure of the significance of this.  Normal CEA level. ?-advance diet to clear liquids ?-con't PPI & carafate ?-serial H&H; transfuse for hemoglobin less than 7 or hemodynamically significant bleeding. ?-monitor clinically for bleeding; remains at risk for re-bleeding ?-Per Dr. Abbey Chatters, gastroenterologist at [facility] repeat CT scan in 6 weeks and repeat EGD in 8 to 10 weeks pending his clinical course. ?-Continue monitoring in the ICU given high risk for repeat bleeding given size of ulcer. ? ?Tobacco abuse- smokes 0.5ppd ?-Discussed the importance of quitting smoking ?- Continue nicotine replacement therapy ?- Needs lung cancer screening referral at discharge. ? ?Alcoholic steatohepatitis, ongoing alcohol abuse ?Seen by hepatology at [facility]. Elevated AST/ALT but morphology on [place] not consistent with cirrhosis.  ?-Avoid hepatotoxic medications ?- Counseled him on the importance of quitting drinking altogether.  He downplays him when he drinks but his sister at bedside gives a different history. ? ?Syncope due to hypovolemic shock; was associated with mild seizure like activity  from transient hypoperfusion. Neurologically intact at present.  ?-No need for AED's or EEG monitoring. ?-Neurology recommending CT with contrast to rule out structural brain lesions ?- Ativan as needed for seizure-like activity ?- Appreciate neurology's  input ? ?Hypertension ?-hydralazine PRN for SBP >180 ? ?Hypertrophic cardiomyopathy ([place]) ?Echocardiogram 4/9 consistent with possible left ventricular hypertrophy with S.A.M. and intracavitary gradient.   ?-Avoid hypovolemia ?-Consider cardiac MRI as outpatient to better assess; no inpatient workup required ? ?Sister updated at bedside during rounds. ? ?Best Practice (right click and "Reselect all SmartList Selections" daily)  ? ?Diet/type: clear liquids ?DVT prophylaxis: SCD ?GI prophylaxis: PPI ?Lines: Central line ?Foley:  N/A ?Code Status:  full code ?Last date of multidisciplinary goals of care discussion '[ ]'$  ? ?This patient is critically ill with multiple organ system failure which requires frequent high complexity decision making, assessment, support, evaluation, and titration of therapies. This was completed through the application of advanced monitoring technologies and extensive interpretation of multiple databases. During this encounter critical care time was devoted to patient care services described in this note for 35 minutes. ? ?Julian Hy, DO 03/16/22 2:15 PM ?[facility] ? ?  ?

## 2022-03-16 NOTE — Procedures (Signed)
Patient Name: Wesley Francis  ?MRN: [id number]  ?Epilepsy Attending: Lora Havens  ?Referring Physician/Provider: Kerney Elbe, MD ?Date: 03/16/2022 ?Duration: 22.16 mins ? ?Patient history: 56 y.o. male with a 5 minute spell of awake unresponsiveness at home. He was loaded with Keppra 2000 mg at the OSH.  EEG to evaluate for seizure. ? ?Level of alertness: Awake ? ?AEDs during EEG study: None ? ?Technical aspects: This EEG study was done with scalp electrodes positioned according to the 10-20 International system of electrode placement. Electrical activity was acquired at a sampling rate of '500Hz'$  and reviewed with a high frequency filter of '70Hz'$  and a low frequency filter of '1Hz'$ . EEG data were recorded continuously and digitally stored.  ? ?Description: The posterior dominant rhythm consists of 9 Hz activity of moderate voltage (25-35 uV) seen predominantly in posterior head regions, symmetric and reactive to eye opening and eye closing. Hyperventilation and photic stimulation were not performed.    ? ?IMPRESSION: ?This study is within normal limits. No seizures or epileptiform discharges were seen throughout the recording. ? ?Lora Havens  ? ?

## 2022-03-16 NOTE — Progress Notes (Addendum)
NEUROLOGY CONSULTATION PROGRESS NOTE  ? ?Date of service: March 16, 2022 ?Patient Name: Wesley Francis ?MRN:  [id number] ?DOB:  [birth year] ? ?Brief HPI  ?Wesley Francis is a 56 y.o. male with a 5 minute spell of awake unresponsiveness at home. He was loaded with Keppra 2000 mg at the OSH. No history of seizures. He was found to have GI bleed, hypotension and underwent embolization of a pseudoaneurysm for his GI bleed. ?  ?Interval Hx  ? ?No further events. LTM with no epileptiform discharges, no seizures. I spoke with him and his sister and reconfirmed the semiology the event and it appears to be consistent with a potential convulsive syncope rather than seizure. ? ?Vitals  ? ?Vitals:  ? 03/16/22 0600 03/16/22 0800 03/16/22 0814 03/16/22 1134  ?BP: (!) 172/71 (!) 147/83    ?Pulse: 90 84    ?Resp: 16 15    ?Temp:   98.1 ?F (36.7 ?C) 98.3 ?F (36.8 ?C)  ?TempSrc:   Oral Oral  ?SpO2: 97% 98%    ?Weight:      ?Height:      ?  ? ?Body mass index is 22.24 kg/m?. ? ?Physical Exam  ? ?General: Laying comfortably in bed; in no acute distress.  ?HENT: Normal oropharynx and mucosa. Normal external appearance of ears and nose.  ?Neck: Supple, no pain or tenderness  ?CV: No JVD. No peripheral edema.  ?Pulmonary: Symmetric Chest rise. Normal respiratory effort.  ?Abdomen: Soft to touch, non-tender.  ?Ext: No cyanosis, edema, or deformity  ?Skin: No rash. Normal palpation of skin.   ?Musculoskeletal: Normal digits and nails by inspection. No clubbing.  ? ?Neurologic Examination  ?Mental status/Cognition: Alert, oriented to self, place, month and year, good attention.  ?Speech/language: Fluent, comprehension intact, object naming intact, repetition intact.  ?Cranial nerves:  ? CN II Pupils equal and reactive to light, no VF deficits   ? CN III,IV,VI EOM intact, no gaze preference or deviation, no nystagmus   ? CN V normal sensation in V1, V2, and V3 segments bilaterally   ? CN VII no asymmetry, no nasolabial fold flattening   ? CN  VIII normal hearing to speech   ? CN IX & X normal palatal elevation, no uvular deviation   ? CN XI 5/5 head turn and 5/5 shoulder shrug bilaterally   ? CN XII midline tongue protrusion   ? ?Motor:  ?Muscle bulk: normal, tone normal ?Mvmt Root Nerve  Muscle Right Left Comments  ?SA C5/6 Ax Deltoid     ?EF C5/6 Mc Biceps 5 5   ?EE C6/7/8 Rad Triceps 5 5   ?WF C6/7 Med FCR     ?WE C7/8 PIN ECU     ?F Ab C8/T1 U ADM/FDI 5 5   ?HF L1/2/3 Fem Illopsoas 5 5   ?KE L2/3/4 Fem Quad     ?DF L4/5 D Peron Tib Ant 5 5   ?PF S1/2 Tibial Grc/Sol 5 5   ? ?Sensation: ? Light touch Intact throughout  ? Pin prick   ? Temperature   ? Vibration   ?Proprioception   ? ?Coordination/Complex Motor:  ?- Finger to Nose intact BL ?- Gait: Deferred for patient safety. ? ?Labs  ? ?Basic Metabolic Panel:  ?Lab Results  ?Component Value Date  ? NA 136 03/16/2022  ? K 4.2 03/16/2022  ? CO2 18 (L) 03/16/2022  ? GLUCOSE 95 03/16/2022  ? BUN 27 (H) 03/16/2022  ? CREATININE 0.94 03/16/2022  ? CALCIUM 8.0 (  L) 03/16/2022  ? GFRNONAA >60 03/16/2022  ? GFRAA >60 11/20/2018  ? ?HbA1c:  ?Lab Results  ?Component Value Date  ? HGBA1C 4.9 12/20/2017  ? ?LDL: No results found for: LDLCALC ?Urine Drug Screen:  ?   ?Component Value Date/Time  ? LABOPIA NONE DETECTED 12/20/2017 1222  ? COCAINSCRNUR NONE DETECTED 12/20/2017 1222  ? LABBENZ NONE DETECTED 12/20/2017 1222  ? AMPHETMU NONE DETECTED 12/20/2017 1222  ? THCU NONE DETECTED 12/20/2017 1222  ? LABBARB NONE DETECTED 12/20/2017 1222  ?  ?Alcohol Level  ?   ?Component Value Date/Time  ? ETH <10 03/09/2022 0956  ? ?No results found for: PHENYTOIN, ZONISAMIDE, LAMOTRIGINE, LEVETIRACETA ?No results found for: PHENYTOIN, PHENOBARB, VALPROATE, CBMZ ? ?Imaging and Diagnostic studies  ? ? ?CTH w/o contrast is pending ? ? ?Impression  ? ?Wesley Francis is a 56 y.o. male with a 5 minute spell of awake unresponsiveness at home. He was loaded with Keppra 2000 mg at the OSH. No history of seizures. Semiology of the event  does not seem to be consistent with seizure. Likely this was a potential convulsive syncope secondary to hypotension and GI bleed. EEG with no epileptiform discharges, no seizures. No further events since presentation. ? ?Recommendations  ?- Will get CTH w/o contrast to rule out any clear epileptiform discharges. ?- No further AEDs at this time. ?- No further workup if CTH w/o contrast is negative for any obvious structural brain abnormality. ?______________________________________________________________________ ? ?6:32 PM ?Update: Reviewed CTH w/o contrast which was negative for any acute intracranial abnormalities. Has a remote L cerebellar stroke and would benefit from outpatient neurology follow up for that. We will signoff. Please feel free to contact us with any questions and concerns. ? ?Thank you for the opportunity to take part in the care of this patient. If you have any further questions, please contact the neurology consultation attending. ? ?Signed, ? ?Donnetta Simpers ?[facility] ?Pager Number [phone] ? ?

## 2022-03-16 NOTE — Progress Notes (Signed)
EEG complete - results pending.  ? ?Patient was a STAT portable from 03/15/22. ?The order came in around 16:25 yesterday but patient went down to IR. First shift was unable to hook-up patient due to the length of time he was down in imaging.  ? ?Lindzen wanted/needed the EEG completed that evening on yesterday. The on-call tech was phoned and told about the patient. The EEG was not performed on yesterday, however.  ? ?

## 2022-03-16 NOTE — Telephone Encounter (Signed)
Let's hold off for now on scheduling this. Thanks ?

## 2022-03-16 NOTE — Telephone Encounter (Signed)
Noted  

## 2022-03-16 NOTE — Progress Notes (Signed)
? ? ?Referring Physician(s): ?* No referring provider recorded for this case * ? ?Supervising Physician: Juliet Rude ? ?Patient Status:  [facility] - In-pt ? ?Chief Complaint: ?GI bleed ? ?Subjective: ?Patient resting in bed s/p embolization procedure yesterday evening.  Family at bedside. Remains NPO; reports hunger.  ?Has no questions related to procedure.  Reports feeling better.  ?No further bloody bowel movements.  ? ?Allergies: ?Patient has no known allergies. ? ?Medications: ?Prior to Admission medications   ?Medication Sig Start Date End Date Taking? Authorizing Provider  ?acetaminophen (TYLENOL) 325 MG tablet Take 650 mg by mouth every 6 (six) hours as needed for mild pain.   Yes [provider]  ?albuterol (PROVENTIL HFA;VENTOLIN HFA) 108 (90 Base) MCG/ACT inhaler Inhale 2 puffs into the lungs every 6 (six) hours as needed for wheezing or shortness of breath. 04/06/16  Yes Rexene Alberts, MD  ?Cholecalciferol (VITAMIN D) 2000 units CAPS Take 1 capsule by mouth every other day.   Yes [provider]  ?metoCLOPramide (REGLAN) 10 MG tablet Take 1 tablet (10 mg total) by mouth 3 (three) times daily as needed (hiccups). 02/15/22  Yes Harris, Abigail, PA-C  ?pantoprazole (PROTONIX) 40 MG tablet Take 1 tablet (40 mg total) by mouth 2 (two) times daily. 03/13/22 04/12/22 Yes Shah, Pratik D, DO  ?sucralfate (CARAFATE) 1 GM/10ML suspension Take 10 mLs (1 g total) by mouth 4 (four) times daily -  with meals and at bedtime. 03/13/22  Yes Manuella Ghazi, Pratik D, DO  ?thiamine 100 MG tablet Take 1 tablet (100 mg total) by mouth daily. 12/23/17  Yes Manuella Ghazi, Pratik D, DO  ? ? ? ?Vital Signs: ?BP (!) 147/83 (BP Location: Left Leg)   Pulse 84   Temp 98.3 ?F (36.8 ?C) (Oral)   Resp 15   Ht '5\' 6"'$  (1.676 m)   Wt 137 lb 12.6 oz (62.5 kg)   SpO2 98%   BMI 22.24 kg/m?  ? ?Physical Exam ?Vitals and nursing note reviewed.  ?Constitutional:   ?   General: He is not in acute distress. ?   Appearance: Normal appearance. He is not  ill-appearing.  ?HENT:  ?   Mouth/Throat:  ?   Mouth: Mucous membranes are moist.  ?   Pharynx: Oropharynx is clear.  ?Cardiovascular:  ?   Rate and Rhythm: Normal rate and regular rhythm.  ?Pulmonary:  ?   Effort: Pulmonary effort is normal.  ?   Breath sounds: Normal breath sounds.  ?Abdominal:  ?   General: Abdomen is flat.  ?   Palpations: Abdomen is soft.  ?Skin: ?   General: Skin is warm and dry.  ?   Comments: R groin procedure site intact, clean, and dry.  Mildly tender to palpation.  No hematoma or pseudoaneurysm.   ?Neurological:  ?   General: No focal deficit present.  ?   Mental Status: He is alert and oriented to person, place, and time. Mental status is at baseline.  ? ? ?Imaging: ?IR Angiogram Visceral Selective ? ?Result Date: 03/16/2022 ?INDICATION: 56 year old with upper GI bleed and duodenal ulceration in the ampullary region. There is a pseudoaneurysm in the ampullary region based on recent CTA examination. Patient needs mesenteric arteriogram with possible embolization. In addition, the patient needs a central venous catheter for fluid and blood products. EXAM: 1. Mesenteric arteriography: Celiac, SMA and SMA branches 2. Coil embolization of pseudoaneurysm and pancreaticoduodenal branch 3. Placement of PICC line with ultrasound and fluoroscopic guidance MEDICATIONS: Moderate sedation ANESTHESIA/SEDATION: Moderate (  conscious) sedation was employed during this procedure. A total of Versed 3.'0mg'$  and fentanyl 100 mcg was administered intravenously at the order of the provider performing the procedure. Total intra-service moderate sedation time: 139 minutes. Patient's level of consciousness and vital signs were monitored continuously by radiology nurse throughout the procedure under the supervision of the provider performing the procedure. CONTRAST:  132 mL Omnipaque 300 FLUOROSCOPY: Radiation Exposure Index (as provided by the fluoroscopic device): 035 mGy Kerma COMPLICATIONS: None immediate.  PROCEDURE: Informed consent was obtained from the patient following explanation of the procedure, risks, benefits and alternatives. The patient understands, agrees and consents for the procedure. All questions were addressed. A time out was performed prior to the initiation of the procedure. Patient was placed supine on the interventional table. Left arm was evaluated for PICC line placement. Ultrasound demonstrated a patent left brachial veins. Ultrasound image was saved for documentation. Left arm was prepped and draped in sterile fashion. Maximal barrier sterile technique was utilized including caps, mask, sterile gowns, sterile gloves, sterile drape, hand hygiene and skin antiseptic. Skin was anesthetized using 1% lidocaine. Using ultrasound guidance, 21 gauge needle was directed into a left brachial vein and wire was advanced centrally. Peel-away sheath was placed. A dual lumen power PICC line was cut to 42 cm and advanced through the peel-away sheath. Catheter tip placed at the superior cavoatrial junction. Both lumens aspirated and flushed well. Catheter was secured to skin. Fluoroscopic images were taken and saved for this procedure. Ultrasound confirmed a patent right common femoral artery. Ultrasound image was saved for documentation. Right groin was prepped and draped in sterile fashion. Maximal barrier sterile technique was utilized including caps, mask, sterile gowns, sterile gloves, sterile drape, hand hygiene and skin antiseptic. Skin was anesthetized with 1% lidocaine. Using ultrasound guidance, 21 gauge needle was directed into the right common femoral artery and 0.018 wire was advanced. Micropuncture dilator set was placed. Bentson wire was placed and a 5 [place] vascular sheath was placed. A C2 catheter was used to cannulate the superior mesenteric artery. SMA arteriography was performed. A Lantern microcatheter was used to select a proximal SMA branch leading to pancreaticoduodenal branches.  Selective angiography was performed in these pancreaticoduodenal branches. Pseudoaneurysm was identified. Eventually, the microcatheter was advanced into the feeding artery and then into the pseudoaneurysm itself. Unable to advance a wire or catheter into the outflow vessel of the pseudoaneurysm. As a result, coil embolization was performed within the pseudoaneurysm itself. 8 mm Ruby soft coil and two 4 mm Ruby coils were placed within the pseudoaneurysm. Microcatheter was slightly pulled out of the pseudoaneurysm and the inflow vessel was embolized using a 2 mm Ruby coil. Follow-up angiography demonstrated no flow into the pseudoaneurysm or feeding branch. Microcatheter was removed. The C2 catheter was used to cannulate the celiac trunk and arteriography was performed. No collateral filling to the pseudoaneurysm was identified and the C2 catheter was removed. Angiogram was performed through the right groin sheath. Attempted to place an ExoSeal closure device but the closure device did not work properly. Closure device was completely removed and manual compression was held for approximately 10 minutes. Right groin hemostasis at the end of the procedure. Bandage placed over the puncture site. FINDINGS: PICC line tip at the superior cavoatrial junction. Based on patient's previous CTA, there is not a typical gastroduodenal artery. In addition, there is variant anatomy with a replaced right hepatic artery coming off the SMA. SMA arteriography demonstrated a patent replaced right hepatic artery. Proximal  branch of the SMA demonstrates a vessel coursing cephalad and representing a gastroepiploic artery. Small branch near the gastroepiploic artery likely represents a pancreaticoduodenal branch and has an aneurysm with an outflow vessel. This aneurysm measures up to 9 mm in diameter. This is most compatible with a pseudoaneurysm. The pseudoaneurysm was filled with coils. There was still flow in the outflow vessel even  after the pseudoaneurysm was coiled. The inflow vessel was embolized using a single 2 mm Ruby coil. There was no filling of the pseudoaneurysm or outflow vessel after the inflow vessel was embolized. Follow-up angiography d

## 2022-03-17 DIAGNOSIS — K279 Peptic ulcer, site unspecified, unspecified as acute or chronic, without hemorrhage or perforation: Secondary | ICD-10-CM

## 2022-03-17 DIAGNOSIS — F1010 Alcohol abuse, uncomplicated: Secondary | ICD-10-CM | POA: Diagnosis not present

## 2022-03-17 DIAGNOSIS — D649 Anemia, unspecified: Secondary | ICD-10-CM | POA: Diagnosis not present

## 2022-03-17 DIAGNOSIS — D62 Acute posthemorrhagic anemia: Secondary | ICD-10-CM | POA: Diagnosis not present

## 2022-03-17 LAB — CBC
HCT: 21.6 % — ABNORMAL LOW (ref 39.0–52.0)
HCT: 27.3 % — ABNORMAL LOW (ref 39.0–52.0)
Hemoglobin: 7.2 g/dL — ABNORMAL LOW (ref 13.0–17.0)
Hemoglobin: 8.8 g/dL — ABNORMAL LOW (ref 13.0–17.0)
MCH: 29.5 pg (ref 26.0–34.0)
MCH: 29 pg (ref 26.0–34.0)
MCHC: 33.3 g/dL (ref 30.0–36.0)
MCHC: 32.2 g/dL (ref 30.0–36.0)
MCV: 88.5 fL (ref 80.0–100.0)
MCV: 90.1 fL (ref 80.0–100.0)
Platelets: 180 10*3/uL (ref 150–400)
Platelets: 272 10*3/uL (ref 150–400)
RBC: 2.44 MIL/uL — ABNORMAL LOW (ref 4.22–5.81)
RBC: 3.03 MIL/uL — ABNORMAL LOW (ref 4.22–5.81)
RDW: 14.4 % (ref 11.5–15.5)
RDW: 14.6 % (ref 11.5–15.5)
WBC: 7.6 10*3/uL (ref 4.0–10.5)
WBC: 9 10*3/uL (ref 4.0–10.5)
nRBC: 0 % (ref 0.0–0.2)
nRBC: 0 % (ref 0.0–0.2)

## 2022-03-17 LAB — BASIC METABOLIC PANEL
Anion gap: 6 (ref 5–15)
BUN: 18 mg/dL (ref 6–20)
CO2: 20 mmol/L — ABNORMAL LOW (ref 22–32)
Calcium: 7.9 mg/dL — ABNORMAL LOW (ref 8.9–10.3)
Chloride: 109 mmol/L (ref 98–111)
Creatinine, Ser: 1 mg/dL (ref 0.61–1.24)
GFR, Estimated: >60 mL/min (ref >60)
Glucose, Bld: 87 mg/dL (ref 70–99)
Potassium: 3.7 mmol/L (ref 3.5–5.1)
Sodium: 135 mmol/L (ref 135–145)

## 2022-03-17 MED ORDER — ADULT MULTIVITAMIN W/MINERALS CH
1.0000 | ORAL_TABLET | Freq: Every day | ORAL | Status: DC
  Administered 2022-03-17 – 2022-03-20 (×4): 1 via ORAL
  Filled 2022-03-17 (×4): qty 1

## 2022-03-17 MED ORDER — FOLIC ACID 1 MG PO TABS
1.0000 mg | ORAL_TABLET | Freq: Every day | ORAL | Status: DC
  Administered 2022-03-17 – 2022-03-20 (×4): 1 mg via ORAL
  Filled 2022-03-17 (×4): qty 1

## 2022-03-17 MED ORDER — AMLODIPINE BESYLATE 5 MG PO TABS
5.0000 mg | ORAL_TABLET | Freq: Every day | ORAL | Status: DC
  Administered 2022-03-17 – 2022-03-20 (×4): 5 mg via ORAL
  Filled 2022-03-17 (×4): qty 1

## 2022-03-17 MED ORDER — POTASSIUM CHLORIDE CRYS ER 20 MEQ PO TBCR
40.0000 meq | EXTENDED_RELEASE_TABLET | Freq: Once | ORAL | Status: AC
  Administered 2022-03-17: 40 meq via ORAL
  Filled 2022-03-17: qty 2

## 2022-03-17 NOTE — Progress Notes (Signed)
? ?NAME:  Wesley Francis, MRN:  [id number], DOB:  27-Dec-1965, LOS: 3 ?ADMISSION DATE:  03/14/2022, CONSULTATION DATE: 03/15/2022 ?REFERRING MD: [facility], CHIEF COMPLAINT: Recurrent GI bleed ? ?History of Present Illness:  ?57 year old male with a past medical history of alcohol abuse, hypertension, asthma, iron deficiency who presented to [facility] emergency room having dark stools.  He is recently been discharged from [facility] after 3 days.  He presents again with some bloody stools symptomatic anemia he has been fluid resuscitated and transfused with packed cells stabilized and needs to be transferred to [facility] and be seen by interventional radiology for coiling of GI bleed.  Critical care has been asked to admit. ? ?Pertinent  Medical History  ? ?Past Medical History:  ?Diagnosis Date  ? AKI (acute kidney injury) ([place]) 04/04/2016  ? Anemia   ? Arthritis   ? Asthma   ? ETOH abuse   ? Folate deficiency 04/05/2016  ? Heme positive stool 04/05/2016  ? Hypertension   ? ? ? ?Significant Hospital Events: ?Including procedures, antibiotic start and stop dates in addition to other pertinent events   ?03/15/2022 transferred from [facility] to [facility], embolization completed. ? ?Interim History / Subjective:  ?He is ready for solid food, but otherwise denies complaints. No BM since admission. ? ?Objective   ?Blood pressure (!) 142/71, pulse 85, temperature 98.6 ?F (37 ?C), temperature source Oral, resp. rate 17, height '5\' 6"'$  (1.676 m), weight 62.5 kg, SpO2 97 %. ?   ?   ? ?Intake/Output Summary (Last 24 hours) at 03/17/2022 0904 ?Last data filed at 03/17/2022 0100 ?Gross per 24 hour  ?Intake 1347.53 ml  ?Output 1000 ml  ?Net 347.53 ml  ? ? ?Filed Weights  ? 03/14/22 0727 03/15/22 0500  ?Weight: 60.8 kg 62.5 kg  ? ? ?Examination: ?General: middle aged man sitting up in bed watching TV in NAD ?HENT: [place]/AT, eyes anicteric ?Lungs: Breathing comfortably on room air, CTAB ?Cardiovascular: S1-S2,  regular rate and rhythm ?Abdomen: Soft, nontender, nondistended ?Extremities: No peripheral edema, no cyanosis or clubbing ?Neuro: Awake and alert, answering questions appropriately.  Moving all extremities. ? ?H/H 7.2/ 21.6 ?WBC 7.6 ?Bicarb 20 ?BUN 18 ?Creatinine 1.0 ?Pathology reviewed> no malignancy, no H. Pylori ? ?CT head personally reviewed> small remote L cerebellar infarct, but no acute abnormalities. ? ?Resolved hospital problem list:  ?Hemorrhagic shock ? ?Assessment & Plan:  ?Acute blood loss anemia due to acute upper GI bleed; Forrest IIb peptic ulcer in duodenum.  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  Pathology negative for malignancy and H pylori. CA19-9 elevated; not sure of the significance of this.  Normal CEA level. ?-advance diet to regular ?-con't PPI infusion & carafate ?-Recheck CBC this afternoon.  If stable plan on transferring out of the ICU ?-Monitor clinically for bleeding, remains at high risk for rebleeding given size of his ulcer ?-Per Dr. Abbey Chatters, gastroenterologist at [facility] repeat CT scan in 6 weeks and repeat EGD in 8 to 10 weeks pending his clinical course.  I reinforced this with Wesley Francis that he needs to make sure he follows up ?-Transfuse for hemoglobin less than 7 or hemodynamically significant bleeding. ? ?Tobacco abuse- smokes 0.5ppd ?- We have previously discussed the importance of quitting smoking. ?- Continue nicotine replacement therapy. ?- Needs referral for lung cancer screening at discharge. ? ?Alcoholic steatohepatitis, ongoing alcohol abuse ?Seen by hepatology at  [facility]. Elevated AST/ALT but morphology on [place] not consistent with cirrhosis.  ?- Avoid past toxic medications ?- Recheck LFTs tomorrow. ?- Counseled on the importance of quitting drinking altogether previously.  He has downplayed the amount he drinks, but his sisters have indicated that it is significant daily alcohol  consumption. ?- Vitamins ? ?Mild NAGMA, improving ?-monitor ? ?Syncope due to hypovolemic shock; was associated with mild seizure like activity from transient hypoperfusion. Neurologically intact at present.  Remote cerebellar stroke on CT, but no acute findings ?- Appreciate neurology's input.  No need for AEDs. ?- No additional imaging required. ? ?Hypertension ?-Continue hydralazine as needed for sustained hypertension ?-Start amlodipine daily ? ?Hypertrophic cardiomyopathy  ?Echocardiogram 4/9 consistent with possible left ventricular hypertrophy with S.A.M. and intracavitary gradient.  Echo was done in a hypovolemic state. ?-Avoid hypovolemia. ?-Consider cardiac MRI as outpatient to better assess; no inpatient workup required ? ?Wesley Francis was updated at bedside during rounds. Stable to transfer out of ICU Francis.  ? ?Best Practice (right click and "Reselect all SmartList Selections" daily)  ? ?Diet/type: Regular consistency (see orders) ?DVT prophylaxis: SCD ?GI prophylaxis: PPI ?Lines: Central line and No longer needed.  Order written to d/c  ?Foley:  N/A ?Code Status:  full code ?Last date of multidisciplinary goals of care discussion '[ ]'$  ? ? ?Julian Hy, DO 03/17/22 9:35 AM ?[facility] ? ?  ?

## 2022-03-17 NOTE — Progress Notes (Signed)
0700 - Report received from [place], [place].  All questions answered.  Safety checks performed.  All lines and drips verified. ?Hand hygiene performed before/after each pt contact. ?0800 - Assessment & Rx.  Breakfast arrived. ?0900 - Dr. Carlis Abbott rounded.  Diet order updated. ?1000 - Pt advised that he was still hungry.  Graham crackers, peanut butter, pears, and pudding given.  Pt very appreciative. ?1130 - Pt's lunch arrived. ?1300 - PICC D/C'd, per order. ?1400 - Pt bathed with soap and CHG.  Full linen change. ?1500 - Discussed transfer w/Dr. Carlis Abbott. ?1545 - Pt walked 160 ft and then out to the patio. ?1630 - Pt placed on bed pan. ?1640 - Pt advised that he only had gas, but was finished. ?1700 - Pt noted to be hypertensive.  PRN hydralazine given.  Protonix stopped, PIV flushed, hydralazine given, then PIV flushed again d/t IV incompatibility.  ?1900 - Report given to [place], Therapist, sports.  All questions answered. ?

## 2022-03-17 NOTE — Progress Notes (Signed)
[facility] ADULT ICU REPLACEMENT PROTOCOL ? ? ?The patient does apply for the [facility] Adult ICU Electrolyte Replacment Protocol based on the criteria listed below:  ? ?1.Exclusion criteria: TCTS patients, ECMO patients, and Dialysis patients ?2. Is GFR >/= 30 ml/min? Yes.    ?Patient's GFR today is >60 ?3. Is SCr </= 2? Yes.   ?Patient's SCr is 1.0 mg/dL ?4. Did SCr increase >/= 0.5 in 24 hours? No. ?5.Pt's weight >40kg  Yes.   ?6. Abnormal electrolyte(s): K+3.7  ?7. Electrolytes replaced per protocol ?8.  Call MD STAT for K+ </= 2.5, Phos </= 1, or Mag </= 1 ?Physician:  Dr. Oletta Darter ? ?Carlisle Beers 03/17/2022 2:04 AM  ?

## 2022-03-18 DIAGNOSIS — D649 Anemia, unspecified: Secondary | ICD-10-CM | POA: Diagnosis not present

## 2022-03-18 LAB — BASIC METABOLIC PANEL
Anion gap: 5 (ref 5–15)
BUN: 12 mg/dL (ref 6–20)
CO2: 22 mmol/L (ref 22–32)
Calcium: 8.3 mg/dL — ABNORMAL LOW (ref 8.9–10.3)
Chloride: 110 mmol/L (ref 98–111)
Creatinine, Ser: 1.1 mg/dL (ref 0.61–1.24)
GFR, Estimated: >60 mL/min (ref >60)
Glucose, Bld: 102 mg/dL — ABNORMAL HIGH (ref 70–99)
Potassium: 3.6 mmol/L (ref 3.5–5.1)
Sodium: 137 mmol/L (ref 135–145)

## 2022-03-18 LAB — HEPATIC FUNCTION PANEL
ALT: 15 U/L (ref 0–44)
AST: 17 U/L (ref 15–41)
Albumin: 2.2 g/dL — ABNORMAL LOW (ref 3.5–5.0)
Alkaline Phosphatase: 78 U/L (ref 38–126)
Bilirubin, Direct: 0.5 mg/dL — ABNORMAL HIGH (ref 0.0–0.2)
Indirect Bilirubin: 0.6 mg/dL (ref 0.3–0.9)
Total Bilirubin: 1.1 mg/dL (ref 0.3–1.2)
Total Protein: 4.2 g/dL — ABNORMAL LOW (ref 6.5–8.1)

## 2022-03-18 LAB — CBC
HCT: 22.2 % — ABNORMAL LOW (ref 39.0–52.0)
Hemoglobin: 7.4 g/dL — ABNORMAL LOW (ref 13.0–17.0)
MCH: 29.6 pg (ref 26.0–34.0)
MCHC: 33.3 g/dL (ref 30.0–36.0)
MCV: 88.8 fL (ref 80.0–100.0)
Platelets: 226 10*3/uL (ref 150–400)
RBC: 2.5 MIL/uL — ABNORMAL LOW (ref 4.22–5.81)
RDW: 14.5 % (ref 11.5–15.5)
WBC: 7.6 10*3/uL (ref 4.0–10.5)
nRBC: 0 % (ref 0.0–0.2)

## 2022-03-18 MED ORDER — POTASSIUM CHLORIDE CRYS ER 20 MEQ PO TBCR
40.0000 meq | EXTENDED_RELEASE_TABLET | Freq: Once | ORAL | Status: AC
  Administered 2022-03-18: 40 meq via ORAL
  Filled 2022-03-18: qty 2

## 2022-03-18 NOTE — Progress Notes (Signed)
CSW received consult from RN to call patients sister Wesley Francis.Patient gave permission to speak with his sister Wesley Francis.CSW spoke with patients sister Wesley Francis.All questions answered no further questions reported at this time. TOC will continue to follow and assist with patients dc planning needs. ?

## 2022-03-18 NOTE — Progress Notes (Signed)
0700 - Report received from [place], [place].  All questions answered.  Safety checks performed.  All lines and drips verified. ?Hand hygiene performed before/after each pt contact. ?0800 - Assessment & Rx.  Breakfast arrived. ?9622 - Pt C/O itching/pain at IV site.  Pt's arm notably firm and painful upon palpation just proximal to IV insertion.  Michael from pharmacy consulted d/t probable infiltration.  He advised to remove the IV and place a hot pack to the area. ?0900 - IV access attempted w/o success.  Order for IV team placed.  Infiltrated IV D/C'd and hot pack placed on pt's arm.  Dr. Lupita Leash sent a secure chat with that information.  Pt asking for something for pain in back and R hip.  Dr. Lupita Leash sent a secure chat to that effect. ?Pt's sister concerned that pt is now on amlodipine when he was taking metoprolol prior to this admission.  We discussed that conversation should be had with the physician.   ?Horine Jannette, IV nurse arrived to place IV.  20 G IV placed LFA. ?69 - Dr. Lupita Leash rounded. ?1600 - Pt placed on tele box.  Pt ambulated 370 ft.  Pt became tachycardic during ambulation, but had no SOB, dizziness, weakness, or other complaint.  Pt placed in chair. ?1700 - Pt bathed with soap and CHG. ?1900 - Report given to [employer], Therapist, sports.  All questions answered. ?

## 2022-03-18 NOTE — Plan of Care (Signed)

## 2022-03-18 NOTE — Progress Notes (Signed)
?PROGRESS NOTE ?Wesley Francis  XKG:[id number] DOB: 1966/08/27 DOA: 03/14/2022 ?PCP: Jerel Shepherd, FNP  ? ?Brief Narrative/Hospital Course: ?56 y.o. male with medical history significant for hypertension, asthma, iron deficiency, chronic alcohol abuse, liver cirrhosis, chronic tobacco abuse, arthritis and seasonal allergies who presents to the ED due to having dark maroon stool.  Patient was admitted from 4/3 through 4/7 due to GI bleed.  He had an EGD done on 03/10/2022 which showed none bleeding deep duodenal ulcer with circumferential mass at the base concerning for malignancy with biopsy still pending at this time.  He was discharged home yesterday with hemoglobin of 7.6.  He was reported to have several episodes of slight bright red stained stool at home with possible orthostatic changes especially when patient goes from lying in bed to standing, so it was decided for patient to be brought back to the ED on the morning of 4/8.  Plan was to transfuse 1 unit PRBC and discharg home, but unfortunately he became hypotensive and had a syncopal episode and was also noted to have dark red blood clots per rectum.  He was given 1 L normal saline and 2 more units of PRBCs and has remained stable overnight with fairly stable hemoglobin levels noted.  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  GI has spoken with IR and patient will need transfer for coil embolization. ?Transferred from [facility] to ICU 4/9, embolized and completed. ?Patient was treated for acute blood loss anemia with acute upper GI bleeding from peptic ulcer and duodenal, with pseudoaneurysm at the base of the duodenal lesion, S/P embolization pathology negative for malignancy.  ? ?Subjective: ?Nursing having issues with IV access. ?Overnight afebrile blood pressure 140s to 150s ?Labs with hemoglobin 7.2> 8.8>7.4 ? ?Assessment and Plan: ?Active Problems: ?  Alcohol abuse ?   Tobacco abuse ?  Duodenal ulcer hemorrhage ?  Syncope ?  Hemorrhagic shock ([place]) ?  Alcoholic steatohepatitis ?  Hypertrophic cardiomyopathy ([place]) ? 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  Monitor h/h closely as he is at high risk for rebleeding given size of his ulcer ?-Per Dr. Abbey Chatters, gastroenterologist at [facility] repeat CT scan in 6 weeks and repeat EGD in 8 to 10 weeks pending his clinical course.  I reinforced this with [name] today that he needs to make sure he follows up. ? ?Alcoholic steatohepatitis ?Alcohol abuse: ?Encourage alcohol cessation.  Continue multivitamin with folic acid and thiamine.  Extensively counseled.  Ultrasound not consistent with cirrhosis ? ?Mild NAGMA improved ? 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  Seen by neurology no need for AED.  Continue supportive care.  BP stable ? ?Essential hypertension stable on on amlodipine 5 mg ? ?Hypertrophic cardiomyopathy on echo 4/9-possible left ventricular hypertrophy with S.A.M. and intracavitary gradient.  Echo was done in a hypovolemic state.  Avoid hypovolemia, consider cardiac MRI as outpatient  ? ?DVT prophylaxis: SCDs Start: 03/14/22 2235 ?Code Status:   Code Status: Full Code ?Family Communication: plan of care discussed with patient/wife at bedside. ?Patient status is: Inpatient level of care: Telemetry Medical  ?Remains inpatient because: Ongoing monitoring, at  risk of rebleeding ?Patient currently not stable ? ?Dispo: The patient is from: Home ?           Anticipated disposition: Home in next 1 to 2 days ? ?Mobility Assessment (last 72 hours)   ? ? Mobility Assessment   ? ?  [place] Name 03/17/22 2000 03/15/22 2000  ?  ?  ?  ? What is the highest level of mobility based on the progressive mobility assessment? Level 4 (Walks with assist in room) - Balance while marching in place and cannot step forward and back - Complete Level 4 (Walks with assist in room) - Balance while marching in place and cannot step forward and back - Complete     ? ?  ?  ? ?  ?  ? ?Objective: ?Vitals last 24 hrs: ?Vitals:  ? 03/18/22 0749 03/18/22 0800 03/18/22 0900 03/18/22 1000  ?BP:  130/72 (!) 153/80 (!) 141/75  ?Pulse:  95 89   ?Resp:  '17 17 20  '$ ?Temp: 98.7 ?F (37.1 ?C)     ?TempSrc: Oral     ?SpO2:  97% 98%   ?Weight:      ?Height:      ? ?Weight change:  ? ?Physical Examination: ?General exam: AA0x3,older than stated age, weak appearing. ?HEENT:Oral mucosa moist, Ear/Nose WNL grossly, dentition normal. ?Respiratory system: bilaterally clear BS, no use of accessory muscle ?Cardiovascular system: S1 & S2 +, No JVD,. ?Gastrointestinal system: Abdomen soft,NT,ND, BS+ ?Nervous System:Alert, awake, moving extremities and grossly nonfocal ?Extremities: LE edema none,distal peripheral pulses palpable.  ?Skin: No rashes,no icterus. ?MSK: Normal muscle bulk,tone, power ? ?Medications reviewed:  ?Scheduled Meds: ? sodium chloride   Intravenous Once  ? amLODipine  5 mg Oral Daily  ? Chlorhexidine Gluconate Cloth  6 each Topical Daily  ? folic acid  1 mg Oral Daily  ? multivitamin with minerals  1 tablet Oral Daily  ? nicotine  21 mg Transdermal Daily  ? pantoprazole  40 mg Intravenous Q12H  ? sucralfate  1 g Oral TID WC & HS  ? thiamine  100 mg Oral Daily  ? ?Continuous Infusions: ? lactated ringers Stopped (03/17/22 1603)  ? pantoprazole Stopped (03/18/22 6295)  ? ? ?  ?Diet Order   ? ?       ?  Diet regular Room service appropriate? Yes; Fluid consistency: Thin  Diet effective now       ?  ? ?  ?  ? ?  ?  ? ?  ?  ?  ? ? ?Intake/Output Summary (Last 24 hours) at 03/18/2022 1011 ?Last data filed at 03/18/2022 0900 ?Gross  per 24 hour  ?Intake 1148.84 ml  ?Output 3000 ml  ?Net -1851.16 ml  ? ?Net IO Since Admission: 645.63 mL [03/18/22 1011]  ?Wt Readings from Last 3 Encounters:  ?03/15/22 62.5 kg  ?03/13/22 66.7 kg  ?02/15/22 63.5 kg  ?  ? ?Unresulted Labs (From admission, onward)  ? ?  Start     Ordered  ? 03/17/22 0500  CBC  Daily,   R     ?Question:  Specimen collection method  Answer:  Lab=Lab collect  ? 03/16/22 1354  ? ?  ?  ? ?  ?Data Reviewed: I have personally reviewed following labs and imaging studies ?CBC: ?Recent Labs  ?Lab 2022/03/19 ?0752 2022-03-19 ?1434 03/15/22 ?2841 03/16/22 ?0128 03/16/22 ?1432 03/17/22 ?0023 03/17/22 ?1231 03/18/22 ?0101  ?WBC 9.5 8.3   < > 9.6 7.8 7.6 9.0 7.6  ?NEUTROABS  7.8* 5.7  --   --   --   --   --   --   ?HGB 7.5* 7.1*   < > 8.7* 7.4* 7.2* 8.8* 7.4*  ?HCT 25.0* 22.3*   < > 26.4* 21.7* 21.6* 27.3* 22.2*  ?MCV 92.3 90.7   < > 88.3 88.2 88.5 90.1 88.8  ?PLT 324 315   < > 170 172 180 272 226  ? < > = values in this interval not displayed.  ? ?Basic Metabolic Panel: ?Recent Labs  ?Lab 03/12/22 ?0409 03/13/22 ?0406 03/14/22 ?6546 03/15/22 ?5035 03/16/22 ?0128 03/17/22 ?0023 03/18/22 ?0101  ?NA 137 136 137 138 136 135 137  ?K 3.5 3.8 4.5 4.3 4.2 3.7 3.6  ?CL 110 109 106 113* 112* 109 110  ?CO2 21* 21* 21* 20* 18* 20* 22  ?GLUCOSE 96 95 91 95 95 87 102*  ?BUN 5* 6 24* 21* 27* 18 12  ?CREATININE 0.83 0.96 1.05 0.86 0.94 1.00 1.10  ?CALCIUM 8.1* 8.4* 9.0 7.7* 8.0* 7.9* 8.3*  ?MG 1.4* 1.9  --  1.6* 2.0  --   --   ?PHOS  --   --   --  3.4  --   --   --   ? ?GFR: ?Estimated Creatinine Clearance: 66.3 mL/min (by C-G formula based on SCr of 1.1 mg/dL). ?Liver Function Tests: ?Recent Labs  ?Lab 03/12/22 ?0409 03/14/22 ?0752 03/15/22 ?4656 03/16/22 ?0128 03/18/22 ?0101  ?AST '16 31 22 19 17  '$ ?ALT 25 33 '23 20 15  '$ ?ALKPHOS 102 225* 166* 109 78  ?BILITOT 1.9* 1.9* 2.0* 1.5* 1.1  ?PROT 4.9* 6.3* 4.1* 3.9* 4.2*  ?ALBUMIN 2.4* 3.1* 2.1* 2.0* 2.2*  ? ?No results for input(s): LIPASE, AMYLASE in the last 168 hours. ?No  results for input(s): AMMONIA in the last 168 hours. ?Coagulation Profile: ?Recent Labs  ?Lab 03/14/22 ?8127  ?INR 1.1  ? ?BNP (last 3 results) ?No results for input(s): PROBNP in the last 8760 hours.

## 2022-03-19 ENCOUNTER — Inpatient Hospital Stay (HOSPITAL_COMMUNITY): Payer: Medicaid Other

## 2022-03-19 DIAGNOSIS — D649 Anemia, unspecified: Secondary | ICD-10-CM | POA: Diagnosis not present

## 2022-03-19 DIAGNOSIS — M7989 Other specified soft tissue disorders: Secondary | ICD-10-CM

## 2022-03-19 DIAGNOSIS — M79601 Pain in right arm: Secondary | ICD-10-CM

## 2022-03-19 LAB — COMPREHENSIVE METABOLIC PANEL
ALT: 14 U/L (ref 0–44)
AST: 14 U/L — ABNORMAL LOW (ref 15–41)
Albumin: 2.3 g/dL — ABNORMAL LOW (ref 3.5–5.0)
Alkaline Phosphatase: 74 U/L (ref 38–126)
Anion gap: 4 — ABNORMAL LOW (ref 5–15)
BUN: 9 mg/dL (ref 6–20)
CO2: 25 mmol/L (ref 22–32)
Calcium: 8.6 mg/dL — ABNORMAL LOW (ref 8.9–10.3)
Chloride: 106 mmol/L (ref 98–111)
Creatinine, Ser: 1.01 mg/dL (ref 0.61–1.24)
GFR, Estimated: >60 mL/min (ref >60)
Glucose, Bld: 99 mg/dL (ref 70–99)
Potassium: 3.7 mmol/L (ref 3.5–5.1)
Sodium: 135 mmol/L (ref 135–145)
Total Bilirubin: 1.2 mg/dL (ref 0.3–1.2)
Total Protein: 4.6 g/dL — ABNORMAL LOW (ref 6.5–8.1)

## 2022-03-19 LAB — CBC
HCT: 23.7 % — ABNORMAL LOW (ref 39.0–52.0)
Hemoglobin: 7.8 g/dL — ABNORMAL LOW (ref 13.0–17.0)
MCH: 29.4 pg (ref 26.0–34.0)
MCHC: 32.9 g/dL (ref 30.0–36.0)
MCV: 89.4 fL (ref 80.0–100.0)
Platelets: 265 10*3/uL (ref 150–400)
RBC: 2.65 MIL/uL — ABNORMAL LOW (ref 4.22–5.81)
RDW: 14.5 % (ref 11.5–15.5)
WBC: 7.1 10*3/uL (ref 4.0–10.5)
nRBC: 0 % (ref 0.0–0.2)

## 2022-03-19 NOTE — Progress Notes (Signed)
?PROGRESS NOTE ?Wesley Francis  WLN:[id number] DOB: 08/15/1966 DOA: 03/14/2022 ?PCP: Jerel Shepherd, FNP  ? ?Brief Narrative/Hospital Course: ?56 y.o. male with medical history significant for hypertension, asthma, iron deficiency, chronic alcohol abuse, liver cirrhosis, chronic tobacco abuse, arthritis and seasonal allergies who presents to the ED due to having dark maroon stool.  Patient was admitted from 4/3 through 4/7 due to GI bleed.  He had an EGD done on 03/10/2022 which showed none bleeding deep duodenal ulcer with circumferential mass at the base concerning for malignancy with biopsy still pending at this time.  He was discharged home yesterday with hemoglobin of 7.6.  He was reported to have several episodes of slight bright red stained stool at home with possible orthostatic changes especially when patient goes from lying in bed to standing, so it was decided for patient to be brought back to the ED on the morning of 4/8.  Plan was to transfuse 1 unit PRBC and discharg home, but unfortunately he became hypotensive and had a syncopal episode and was also noted to have dark red blood clots per rectum.  He was given 1 L normal saline and 2 more units of PRBCs and has remained stable overnight with fairly stable hemoglobin levels noted.  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  GI has spoken with IR and patient will need transfer for coil embolization. ?Transferred from [facility] to ICU 4/9, embolized and completed. ?Patient was treated for acute blood loss anemia with acute upper GI bleeding from peptic ulcer and duodenal, with pseudoaneurysm at the base of the duodenal lesion, S/P embolization pathology negative for malignancy.  ? ?Subjective: ?Seen and examined this morning.  His sister is at the bedside again. ?Overnight afebrile  ?Labs with hemoglobin 7.2> 8.8>7.4> 7.8 gm ?Passing only gas, no BM yet. ?He has no new  complaints ? ?Assessment and Plan: ?Active Problems: ?  Alcohol abuse ?  Tobacco abuse ?  Duodenal ulcer hemorrhage ?  Syncope ?  Hemorrhagic shock ([place]) ?  Alcoholic steatohepatitis ?  Hypertrophic cardiomyopathy ([place]) ? 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  H&H overall stable.Monitor has he is at high risk for rebleeding given size of his ulcer.-Per Dr. Abbey Chatters, gastroenterologist at [facility] repeat CT scan in 6 weeks and repeat EGD in 8 to 10 weeks pending his clinical course.  Checks daily H/H ?I communicated with patient's sister at the bedside who made note of need for EGD and CT scan in above-mentioned timeframe and she will need to follow-up with GI-please provide GI number on d/c. ? ?Alcoholic steatohepatitis ?Alcohol abuse: ?Encourage alcohol cessation.  Continue multivitamin with folic acid and thiamine.  Extensively counseled.  Ultrasound not consistent with cirrhosis ? ?Mild NAGMA improved ? 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  Seen by neurology no need for AED.  Stable ? ?Essential  hypertension: Controlled on amlodipine 5 mg ? ?Hypertrophic cardiomyopathy on echo 4/9-possible left ventricular hypertrophy with S.A.M. and intracavitary gradient.  Echo was done in a hypovolemic state.  Avoid hypovolemia, consider cardiac MRI as  outpatient  ? ?Procedures: ?EGD 03/10/22 ?-6 cm hiatal hernia. ?- Congestive gastropathy. Biopsied. ?- Non-bleeding duodenal ulcer at the ampullar area with an adherent clot ([place] [employer] ?IIb). Biopsied. - Ulcer concerning for possible invasive malignancy eroding the duodenal ?wall - ampullary vs pancreatic malignancy. ? ?Recs: ?- Return patient to hospital ward for ongoing care. ?- Clear liquid diet. ?- Await pathology results - sent for STAT review. ?- Follow up CEA and CA 19-9 ?- Pantoprazole drip. ?- Sucralfate 1 g TID ?- Check H/H daily. ?- CT angio STAT if large bleeding of hemodynamic instability. ?- Will discuss with radiology if MRCP may provide further information. ?- Most likely will require EUS in the short term. ?03/15/22 ?1. Mesenteric arteriography: Celiac, SMA and SMA branches ?2. Coil embolization of pseudoaneurysm and pancreaticoduodenal ?Branch ? ?DVT prophylaxis: SCDs Start: 03/14/22 2235 ?Code Status:   Code Status: Full Code ?Family Communication: plan of care discussed with patient/wife at bedside. ?Patient status is: Inpatient level of care: Telemetry Medical  ?Remains inpatient because: Ongoing monitoring, at risk of rebleeding ?Patient currently not stable ? ?Dispo: The patient is from: Home ?           Anticipated disposition: Home in next 1 to 2 days once H&H continues to remain stable ? ?Mobility Assessment (last 72 hours)   ? ? Mobility Assessment   ? ? [place] Name 03/17/22 2000  ?  ?  ?  ?  ? What is the highest level of mobility based on the progressive mobility assessment? Level 4 (Walks with assist in room) - Balance while marching in place and cannot step forward and back - Complete      ? ?  ?  ? ?  ?  ? ?Objective: ?Vitals last 24 hrs: ?Vitals:  ? 03/19/22 0600 03/19/22 0700 03/19/22 0800 03/19/22 0900  ?BP: (!) 143/63 (!) 165/76 133/78 128/63  ?Pulse: 81 (!) 108 (!) 118 (!) 111  ?Resp:      ?Temp:  98.3 ?F (36.8 ?C)    ?TempSrc:  Oral    ?SpO2: 97% 95% 97% 98%  ?Weight:      ?Height:       ? ?Weight change:  ? ?Physical Examination: ?General exam: AA0x3,older than stated age, weak appearing. ?HEENT:Oral mucosa moist, Ear/Nose WNL grossly, dentition normal. ?Respiratory system: bilaterally clear,no use of accessory muscle ?Cardiovascular system: S1 & S2 +, No JVD,. ?Gastrointestinal system: Abdomen soft,NT,ND, BS+ ?Nervous System:Alert, awake, moving extremities and grossly nonfocal ?Extremities: edema neg,distal peripheral pulses palpable.  ?Skin: No rashes,no icterus. ?MSK: Normal muscle bulk,tone, power ? ? ?Medications reviewed:  ?Scheduled Meds: ? sodium chloride   Intravenous Once  ? amLODipine  5 mg Oral Daily  ? Chlorhexidine Gluconate Cloth  6 each Topical Daily  ? folic acid  1 mg Oral Daily  ? multivitamin with minerals  1 tablet Oral Daily  ? nicotine  21 mg Transdermal Daily  ? pantoprazole  40 mg Intravenous Q12H  ? sucralfate  1 g Oral TID WC & HS  ? thiamine  100 mg Oral Daily  ? ?Continuous Infusions: ? lactated ringers Stopped (03/17/22 1603)  ? ? ?  ?Diet Order   ? ?       ?  Diet regular Room service appropriate? Yes; Fluid consistency: Thin  Diet effective now       ?  ? ?  ?  ? ?  ? ?Intake/Output Summary (Last 24 hours) at 03/19/2022 1107 ?Last data filed at 03/19/2022 0800 ?Gross per 24 hour  ?Intake 981.28 ml  ?Output 2400 ml  ?Net -1418.72 ml  ? ?Net IO Since Admission: -1,473.09 mL [03/19/22 1107]  ?Wt Readings from Last 3 Encounters:  ?03/19/22 59.8 kg  ?03/13/22 66.7 kg  ?02/15/22 63.5 kg  ?  ? ?Unresulted Labs (From admission, onward)  ? ?  Start     Ordered  ? 03/17/22 0500  CBC  Daily,   R     ?Question:  Specimen collection method  Answer:  Lab=Lab collect  ? 03/16/22 1354  ? ?  ?  ? ?  ?Data Reviewed: I have personally reviewed following labs and imaging studies ?CBC: ?Recent Labs  ?Lab 04-10-2022 ?0752 10-Apr-2022 ?1434 03/15/22 ?8756 03/16/22 ?1432 03/17/22 ?0023 03/17/22 ?1231 03/18/22 ?0101 03/19/22 ?0127  ?WBC 9.5 8.3   < > 7.8 7.6 9.0 7.6 7.1  ?NEUTROABS 7.8* 5.7   --   --   --   --   --   --   ?HGB 7.5* 7.1*   < > 7.4* 7.2* 8.8* 7.4* 7.8*  ?HCT 25.0* 22.3*   < > 21.7* 21.6* 27.3* 22.2* 23.7*  ?MCV 92.3 90.7   < > 88.2 88.5 90.1 88.8 89.4  ?PLT 324 315   < > 172 180 27

## 2022-03-19 NOTE — Progress Notes (Signed)
Upper extremity venous has been completed.  ? ?Preliminary results in CV Proc.  ? ?Wesley Francis Aicia Babinski ?03/19/2022 2:28 PM    ?

## 2022-03-20 DIAGNOSIS — D649 Anemia, unspecified: Secondary | ICD-10-CM | POA: Diagnosis not present

## 2022-03-20 LAB — CBC
HCT: 24.1 % — ABNORMAL LOW (ref 39.0–52.0)
Hemoglobin: 7.7 g/dL — ABNORMAL LOW (ref 13.0–17.0)
MCH: 28.9 pg (ref 26.0–34.0)
MCHC: 32 g/dL (ref 30.0–36.0)
MCV: 90.6 fL (ref 80.0–100.0)
Platelets: 297 10*3/uL (ref 150–400)
RBC: 2.66 MIL/uL — ABNORMAL LOW (ref 4.22–5.81)
RDW: 14.4 % (ref 11.5–15.5)
WBC: 6.5 10*3/uL (ref 4.0–10.5)
nRBC: 0 % (ref 0.0–0.2)

## 2022-03-20 MED ORDER — AMLODIPINE BESYLATE 5 MG PO TABS: 5.0000 mg | ORAL_TABLET | Freq: Every day | ORAL | 0 refills | Status: DC

## 2022-03-20 MED ORDER — NICOTINE 21 MG/24HR TD PT24
21.0000 mg | MEDICATED_PATCH | Freq: Every day | TRANSDERMAL | 0 refills | Status: DC
Start: 2022-03-20 — End: 2022-05-27

## 2022-03-20 MED ORDER — FOLIC ACID 1 MG PO TABS: 1.0000 mg | ORAL_TABLET | Freq: Every day | ORAL | 0 refills | Status: DC

## 2022-03-20 NOTE — Progress Notes (Signed)
Mobility Specialist Progress Note: ? ? 03/20/22 1044  ?Mobility  ?Activity Ambulated with assistance in hallway  ?Level of Assistance Modified independent, requires aide device or extra time  ?Assistive Device Front wheel walker  ?Distance Ambulated (ft) 570 ft  ?Activity Response Tolerated well  ?$Mobility charge 1 Mobility  ? ?Pt received in bed willing to participate in mobility. No complaints of pain. Left in bathroom and was instructed to pull call string when finished, NT notified.  ? ?Notnamed Croucher ?Mobility Specialist ?Primary Phone [phone] ? ?

## 2022-03-20 NOTE — TOC Transition Note (Signed)
Transition of Care (TOC) - CM/SW Discharge Note ? ? ?Patient Details  ?Name: ELVA BREAKER ?MRN: [id number] ?Date of Birth: 1966-03-10 ? ?Transition of Care (TOC) CM/SW Contact:  ?Verdell Carmine, RN ?Phone Number: ?03/20/2022, 11:06 AM ? ? ?Clinical Narrative:    ? ?Patient ordered a rolling walker, [employer] will deliver to bedside prior to discharge. ?No further needs identified CM signing off.  ? ? ?Final next level of care: Home/Self Care ?Barriers to Discharge: No Barriers Identified ? ? ?Patient Goals and CMS Choice ?  ?  ?  ? ?Discharge Placement ?  ?           ?  ?  ?  ?  ? ?Discharge Plan and Services ?  ?  ?           ?DME Arranged: Walker rolling ?DME Agency: [employer] ?Date DME Agency Contacted: 03/20/22 ?Time DME Agency Contacted: [phone] ?  ?  ?  ?  ?  ?  ? ?Social Determinants of Health (SDOH) Interventions ?  ? ? ?Readmission Risk Interventions ?   ? View : No data to display.  ?  ?  ?  ? ? ? ? ? ?

## 2022-03-20 NOTE — Plan of Care (Signed)
Pt and family understanding of plan of care in relation to discahrge ?

## 2022-03-20 NOTE — Discharge Summary (Signed)
Physician Discharge Summary  ?Wesley Francis YOV:[id number] DOB: June 19, 1966 DOA: 03/14/2022 ? ?PCP: Jerel Shepherd, FNP ? ?Admit date: 03/14/2022 ?Discharge date: 03/20/2022 ? ?Admitted From: Home ?Disposition:  Home ? ?Discharge Condition:Stable ?CODE STATUS:FULL ?Diet recommendation: Heart Healthy  ? ?Brief/Interim Summary: ?56 y.o. male with medical history significant for hypertension, asthma, iron deficiency, chronic alcohol abuse, liver cirrhosis, chronic tobacco abuse, arthritis and seasonal allergies who presents to the ED due to having dark maroon stool.  Patient was admitted from 4/3 through 4/7 due to GI bleed.  He had an EGD done on 03/10/2022 which showed none bleeding deep duodenal ulcer with circumferential mass at the base concerning for malignancy with biopsy still pending at this time.  He was discharged home yesterday with hemoglobin of 7.6.  He was reported to have several episodes of slight bright red stained stool at home with possible orthostatic changes especially when patient goes from lying in bed to standing, so it was decided for patient to be brought back to the ED on the morning of 4/8.  Plan was to transfuse 1 unit PRBC and discharg home, but unfortunately he became hypotensive and had a syncopal episode and was also noted to have dark red blood clots per rectum.  He was given 1 L normal saline and 2 more units of PRBCs and has remained stable overnight with fairly stable hemoglobin levels noted.  CT angiography of the abdomen demonstrated GDA pseudoaneurysm at the base of the duodenal lesion with no active bleeding.  IR was consulted and he underwent mesenteric angiogram with coil embolization of pseudoaneurysm at ulceration region at CTA.  Patient remained hemodynamically stable, he was transferred to our service on 4/12.  He is hemoglobin has been stable in the range of 7, no further episodes of melena, hematochezia or coffee-ground emesis.  Patient will continue Protonix, Carafate on  discharge which have been already prescribed.  Patient understands that he needs to follow-up with his gastroenterologist in 6 to 8 weeks. ? ?Following problems were addressed during his hospitalization: ? 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 ?Ulcer per EGD concerning for possible invasive malignancy eroding the duodenal wall -but pathology negative for malignancy, CA 19-9 elevated at 81 on 4/3, CEA negative.S/P embolization.On PPI IV drip-> switched to IV PPI bid 4/12, Carafate 1 g 3 times daily.  H&H overall stable.Monitor has he is at high risk for rebleeding given size of his ulcer.-Per Dr. Abbey Chatters, gastroenterologist at [facility] repeat CT scan in 6 weeks and repeat EGD in 8 to 10 weeks pending his clinical course.  We communicated with patient's sister at the bedside who made note of need for EGD and CT scan in above-mentioned timeframe and she will need to follow-up with GI, Dr. Abbey Chatters. ?  ?Alcoholic steatohepatitis ?Alcohol abuse: ?Encourage alcohol cessation.  Continue  folic acid and thiamine.  Extensively counseled.  Ultrasound not consistent with cirrhosis ?  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  Seen by neurology no need for AED.  CT head with without contrast was negative for acute abnormalities, showed remote left cerebellar stroke.  Outpatient neurology follow-up recommended. ?  ?Essential hypertension: Controlled on amlodipine 5 mg ?  ?  Hypertrophic cardiomyopathy on echo 4/9-possible left ventricular hypertrophy with S.A.M. and intracavitary gradient.  Echo was done in a hypovolemic  state.  Avoid hypovolemia, consider cardiac MRI as outpatient  if needed after discussion with PCP ? ?Discharge Diagnoses:  ?Active Problems: ?  Alcohol abuse ?  Tobacco abuse ?  Duodenal ulcer hemorrhage ?  Syncope ?  Hemorrhagic shock ([place]) ?  Alcoholic steatohepatitis ?  Hypertrophic cardiomyopathy ([place]) ? ? ? ?Discharge Instructions ? ?Discharge Instructions   ? ? Diet - low sodium heart healthy   Complete by: As directed ?  ? Discharge instructions   Complete by: As directed ?  ? 1)Please take prescribed medications as instructed including Carafate and Protonix ?2)Follow up with your PCP in a week.  Do a CBC just to check your hemoglobin during the follow-up ?3)Follow up with your gastroenterologist in 6 to 8 weeks.  ? Increase activity slowly   Complete by: As directed ?  ? No wound care   Complete by: As directed ?  ? ?  ? ?Allergies as of 03/20/2022   ?No Known Allergies ?  ? ?  ?Medication List  ?  ? ?TAKE these medications   ? ?acetaminophen 325 MG tablet ?Commonly known as: TYLENOL ?Take 650 mg by mouth every 6 (six) hours as needed for mild pain. ?  ?albuterol 108 (90 Base) MCG/ACT inhaler ?Commonly known as: VENTOLIN HFA ?Inhale 2 puffs into the lungs every 6 (six) hours as needed for wheezing or shortness of breath. ?  ?amLODipine 5 MG tablet ?Commonly known as: NORVASC ?Take 1 tablet (5 mg total) by mouth daily. ?Start taking on: March 21, 5026 ?  ?folic acid 1 MG tablet ?Commonly known as: FOLVITE ?Take 1 tablet (1 mg total) by mouth daily. ?Start taking on: March 21, 2022 ?  ?metoCLOPramide 10 MG tablet ?Commonly known as: Reglan ?Take 1 tablet (10 mg total) by mouth 3 (three) times daily as needed (hiccups). ?  ?nicotine 21 mg/24hr patch ?Commonly known as: NICODERM CQ - dosed in mg/24 hours ?Place 1 patch (21 mg total) onto the skin daily. ?  ?pantoprazole 40 MG tablet ?Commonly known as: Protonix ?Take 1 tablet (40 mg total) by mouth 2 (two) times daily. ?  ?sucralfate 1 GM/10ML  suspension ?Commonly known as: CARAFATE ?Take 10 mLs (1 g total) by mouth 4 (four) times daily -  with meals and at bedtime. ?  ?thiamine 100 MG tablet ?Take 1 tablet (100 mg total) by mouth daily. ?  ?Vitamin D 50 MCG (2000 UT) Caps ?Take 1 capsule by mouth every other day. ?  ? ?  ? ? Follow-up Information   ? ? Cobb, Bunnie Pion, FNP. Schedule an appointment as soon as possible for a visit in 1 week(s).   ?Specialty: Family Medicine ?Contact information: ?[place] [address] ?[phone] ? ? ?  ?  ? ?  ?  ? ?  ? ?No Known Allergies ? ?Consultations: ?GI, IR, PCCM ? ? ?Procedures/Studies: ?CT ABDOMEN WO CONTRAST ? ?Result Date: 03/09/2022 ?CLINICAL DATA:  Acute abdominal pain. Rectal bleeding. Questionable duodenal perforation. EXAM: CT ABDOMEN WITHOUT CONTRAST TECHNIQUE: Multidetector CT imaging of the abdomen was performed following the standard protocol without IV contrast. RADIATION DOSE REDUCTION: This exam was performed according to the departmental dose-optimization program which includes automated exposure control, adjustment of the mA and/or kV according to patient size and/or use of iterative reconstruction technique. COMPARISON:  CTA abdomen and pelvis 03/09/2022.  FINDINGS: Lower chest: There is atelectasis or scarring in the lung bases. Hepatobiliary: Again seen is air within the gallbladder, common bile duct and left intrahepatic ducts similar to the prior study. No gallstones are identified. Liver otherwise appears within normal limits. Pancreas: Enlargement of the pancreatic head with surrounding inflammation is similar to the prior study. There is air within the pancreatic head, also unchanged. Pancreatic body and tail are within normal limits. Spleen: Normal in size without focal abnormality. Adrenals/Urinary Tract: There is a 15 mm cyst in the superior pole the right kidney. Additional hypodensity in the left kidney is too small to characterize, but also likely a  cyst. There is no hydronephrosis. Stomach/Bowel: Oral contrast is seen throughout the stomach and mid small bowel loops. Homogeneous density noted within the stomach lumen measuring 7.3 x 9.2 x 4.1 cm which may represent h

## 2022-03-20 NOTE — Plan of Care (Signed)
?  Problem: Education: ?Goal: Knowledge of General Education information will improve ?Description: Including pain rating scale, medication(s)/side effects and non-pharmacologic comfort measures ?Outcome: Progressing ?  ?Problem: Health Behavior/Discharge Planning: ?Goal: Ability to manage health-related needs will improve ?Outcome: Progressing ?  ?Problem: Clinical Measurements: ?Goal: Ability to maintain clinical measurements within normal limits will improve ?Outcome: Progressing ?Goal: Will remain free from infection ?Outcome: Progressing ?Goal: Diagnostic test results will improve ?Outcome: Progressing ?Goal: Respiratory complications will improve ?Outcome: Progressing ?Goal: Cardiovascular complication will be avoided ?Outcome: Progressing ?  ?Problem: Nutrition: ?Goal: Adequate nutrition will be maintained ?Outcome: Progressing ?  ?Problem: Activity: ?Goal: Risk for activity intolerance will decrease ?Outcome: Progressing ?  ?Problem: Nutrition: ?Goal: Adequate nutrition will be maintained ?Outcome: Progressing ?  ?Problem: Coping: ?Goal: Level of anxiety will decrease ?Outcome: Progressing ?  ?Problem: Elimination: ?Goal: Will not experience complications related to bowel motility ?Outcome: Progressing ?Goal: Will not experience complications related to urinary retention ?Outcome: Progressing ?  ?Problem: Pain Managment: ?Goal: General experience of comfort will improve ?Outcome: Progressing ?  ?Problem: Safety: ?Goal: Ability to remain free from injury will improve ?Outcome: Progressing ?  ?Problem: Skin Integrity: ?Goal: Risk for impaired skin integrity will decrease ?Outcome: Progressing ?  ?

## 2022-03-26 ENCOUNTER — Encounter: Payer: Self-pay | Admitting: Internal Medicine

## 2022-04-21 ENCOUNTER — Inpatient Hospital Stay: Payer: Medicaid Other | Admitting: Gastroenterology

## 2022-04-23 ENCOUNTER — Ambulatory Visit (INDEPENDENT_AMBULATORY_CARE_PROVIDER_SITE_OTHER): Payer: Medicaid Other | Admitting: Internal Medicine

## 2022-04-23 ENCOUNTER — Encounter: Payer: Self-pay | Admitting: Internal Medicine

## 2022-04-23 ENCOUNTER — Encounter: Payer: Self-pay | Admitting: *Deleted

## 2022-04-23 VITALS — BP 124/76 | HR 85 | Temp 97.1°F | Ht 66.0 in | Wt 145.0 lb

## 2022-04-23 DIAGNOSIS — K264 Chronic or unspecified duodenal ulcer with hemorrhage: Secondary | ICD-10-CM

## 2022-04-23 DIAGNOSIS — F1010 Alcohol abuse, uncomplicated: Secondary | ICD-10-CM

## 2022-04-23 DIAGNOSIS — D500 Iron deficiency anemia secondary to blood loss (chronic): Secondary | ICD-10-CM

## 2022-04-23 NOTE — Patient Instructions (Signed)
I am happy to hear that you are doing better.  Continue on pantoprazole twice daily.  If you need refills then let us know.  Avoid NSAIDs.  Avoid alcohol.  I am going to order a CT today of your pancreas and small bowel to further evaluate.  We will call with results.  We will plan on repeat EGD in 6 weeks or so to evaluate healing of your ulcer.  I am going to request your most recent blood work from health department in [place].  Follow-up with me in 4 to 6 weeks.  It was nice seeing both you today.  Dr. Abbey Chatters  At [facility] we value your feedback. You may receive a survey about your visit today. Please share your experience as we strive to create trusting relationships with our patients to provide genuine, compassionate, quality care.  We appreciate your understanding and patience as we review any laboratory studies, imaging, and other diagnostic tests that are ordered as we care for you. Our office policy is 5 business days for review of these results, and any emergent or urgent results are addressed in a timely manner for your best interest. If you do not hear from our office in 1 week, please contact us.   We also encourage the use of MyChart, which contains your medical information for your review as well. If you are not enrolled in this feature, an access code is on this after visit summary for your convenience. Thank you for allowing [place] to be involved in your care.  It was great to see you today!  I hope you have a great rest of your Spring!    Elon Alas. Abbey Chatters, D.O. Gastroenterology and Hepatology [facility]

## 2022-04-23 NOTE — Progress Notes (Signed)
Referring Provider: Jerel Shepherd, FNP Primary Care Physician:  Jerel Shepherd, FNP Primary GI:  Dr. Abbey Chatters  Chief complaint: Hospital follow-up visit, anemia, duodenal ulcer   HPI:   Wesley Francis is a 56 y.o. male who presents to clinic today for hospital follow-up visit.    Patient discharged from [facility] on 03/13/22 after a 4-day stay for GI bleeding, underwent EGD 03/10/2022  which showed congested mucosa of the pylorus, biopsies with reactive gastropathy.  1 large deeply cratered ulcer in the duodenum where the ampulla should be located, approximately 30 mm in largest dimension, circumferential, some food debris noted.  EGD notes rounded circumferential mass present at the base of the ulcer partially covered with fibrin, questionable adherent clot.     Case was discussed with advanced endoscopy in [facility] who recommended repeat CT scan in 6 weeks as well as repeat EGD in 8 to 10 weeks pending clinical course.   Patient was discharged 03/13/22 after completing 72 hours of PPI infusion.  Hemoglobin was stable at 7.6.  Abdominal pain had resolved.  Was tolerating diet.   Patient presented back to our ER 03/14/22 with melena.  Hemoglobin was 7.5.  He did receive 1 unit PRBCs.  After completing transfusion he reportedly became hypotensive and had a syncopal episode with multiple bloody bowel movements.  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 He was transferred to [facility] and underwent angiogram with coil embolization of pseudoaneurysm at the ulcer region on CTA by interventional radiology.  Discharged 4 days later with stable hemoglobin.   Today, patient states he is doing well.  Maintained on pantoprazole 40 mg twice daily.  Denies any melena hematochezia.  States he feels "great," good appetite.  No alcohol since hospitalization.  Continues to smoke cigarettes.  States he had blood work done this week at the  health department in [place] which he believes was "good"   Past Medical History:  Diagnosis Date   AKI (acute kidney injury) ([place]) 04/04/2016   Anemia    Arthritis    Asthma    ETOH abuse    Folate deficiency 04/05/2016   Heme positive stool 04/05/2016   Hypertension     Past Surgical History:  Procedure Laterality Date   BIOPSY  03/10/2022   Procedure: BIOPSY;  Surgeon: Harvel Quale, MD;  [facility];  Service: Gastroenterology;;   COLONOSCOPY     COLONOSCOPY WITH PROPOFOL N/A 03/02/2017   Procedure: COLONOSCOPY WITH PROPOFOL;  Surgeon: Lollie Sails, MD;  Location: [facility] ENDOSCOPY;  Service: Endoscopy;  Laterality: N/A;   COLONOSCOPY WITH PROPOFOL N/A 07/08/2020   Procedure: COLONOSCOPY WITH PROPOFOL;  Surgeon: Lesly Rubenstein, MD;  Location: [facility] ENDOSCOPY;  Service: Endoscopy;  Laterality: N/A;   ESOPHAGOGASTRODUODENOSCOPY (EGD) WITH PROPOFOL N/A 07/08/2020   Procedure: ESOPHAGOGASTRODUODENOSCOPY (EGD) WITH PROPOFOL;  Surgeon: Lesly Rubenstein, MD;  Location: [facility] ENDOSCOPY;  Service: Endoscopy;  Laterality: N/A;   ESOPHAGOGASTRODUODENOSCOPY (EGD) WITH PROPOFOL N/A 03/10/2022   Procedure: ESOPHAGOGASTRODUODENOSCOPY (EGD) WITH PROPOFOL;  Surgeon: Harvel Quale, MD;  Location: [facility];  Service: Gastroenterology;  Laterality: N/A;   FRACTURE SURGERY     IR ANGIOGRAM FOLLOW UP STUDY  03/15/2022   IR ANGIOGRAM SELECTIVE EACH ADDITIONAL VESSEL  03/15/2022   IR ANGIOGRAM VISCERAL SELECTIVE  03/15/2022   IR ANGIOGRAM VISCERAL SELECTIVE  03/15/2022   IR EMBO ART  VEN HEMORR LYMPH EXTRAV  INC GUIDE ROADMAPPING  03/15/2022   IR US GUIDE VASC ACCESS RIGHT  03/15/2022   KNEE ARTHROCENTESIS      Current Outpatient Medications  Medication Sig Dispense Refill   acetaminophen (TYLENOL) 325 MG tablet Take 650 mg by mouth every 6 (six) hours as needed for mild pain.     albuterol (PROVENTIL HFA;VENTOLIN HFA) 108 (90 Base) MCG/ACT inhaler Inhale 2 puffs  into the lungs every 6 (six) hours as needed for wheezing or shortness of breath. 1 Inhaler 2   amLODipine (NORVASC) 5 MG tablet Take 1 tablet (5 mg total) by mouth daily. 30 tablet 0   Cholecalciferol (VITAMIN D) 2000 units CAPS Take 1 capsule by mouth every other day.     folic acid (FOLVITE) 1 MG tablet Take 1 tablet (1 mg total) by mouth daily. 30 tablet 0   metoCLOPramide (REGLAN) 10 MG tablet Take 1 tablet (10 mg total) by mouth 3 (three) times daily as needed (hiccups). 20 tablet 0   nicotine (NICODERM CQ - DOSED IN MG/24 HOURS) 21 mg/24hr patch Place 1 patch (21 mg total) onto the skin daily. 28 patch 0   pantoprazole (PROTONIX) 40 MG tablet Take 1 tablet (40 mg total) by mouth 2 (two) times daily. 60 tablet 2   sucralfate (CARAFATE) 1 GM/10ML suspension Take 10 mLs (1 g total) by mouth 4 (four) times daily -  with meals and at bedtime. 420 mL 0   thiamine 100 MG tablet Take 1 tablet (100 mg total) by mouth daily. 30 tablet 0   No current facility-administered medications for this visit.    Allergies as of 04/23/2022   (No Known Allergies)    Family History  Problem Relation Age of Onset   Hypertension Mother    Stroke Mother    Hypertension Father     Social History   Socioeconomic History   Marital status: Single    Spouse name: Not on file   Number of children: Not on file   Years of education: Not on file   Highest education level: Not on file  Occupational History   Not on file  Tobacco Use   Smoking status: Every Day    Packs/day: 0.50    Types: Cigarettes   Smokeless tobacco: Never  Vaping Use   Vaping Use: Never used  Substance and Sexual Activity   Alcohol use: Yes    Alcohol/week: 4.0 standard drinks    Types: 4 Cans of beer per week    Comment: yesterday 03/09/22   Drug use: No   Sexual activity: Not on file  Other Topics Concern   Not on file  Social History Narrative   Not on file   Social Determinants of Health   Financial Resource Strain:  Not on file  Food Insecurity: Not on file  Transportation Needs: Not on file  Physical Activity: Not on file  Stress: Not on file  Social Connections: Not on file    Subjective: Review of Systems  Constitutional:  Negative for chills and fever.  HENT:  Negative for congestion and hearing loss.   Eyes:  Negative for blurred vision and double vision.  Respiratory:  Negative for cough and shortness of breath.   Cardiovascular:  Negative for chest pain and palpitations.  Gastrointestinal:  Negative for abdominal pain, blood in stool, constipation, diarrhea, heartburn, melena and vomiting.  Genitourinary:  Negative for dysuria and urgency.  Musculoskeletal:  Negative for joint pain and myalgias.  Skin:  Negative for itching  and rash.  Neurological:  Negative for dizziness and headaches.  Psychiatric/Behavioral:  Negative for depression. The patient is not nervous/anxious.     Objective: There were no vitals taken for this visit. Physical Exam Constitutional:      Appearance: Normal appearance.  HENT:     Head: Normocephalic and atraumatic.  Eyes:     Extraocular Movements: Extraocular movements intact.     Conjunctiva/sclera: Conjunctivae normal.  Cardiovascular:     Rate and Rhythm: Normal rate and regular rhythm.  Pulmonary:     Effort: Pulmonary effort is normal.     Breath sounds: Normal breath sounds.  Abdominal:     General: Bowel sounds are normal.     Palpations: Abdomen is soft.  Musculoskeletal:        General: Normal range of motion.     Cervical back: Normal range of motion and neck supple.  Skin:    General: Skin is warm.  Neurological:     General: No focal deficit present.     Mental Status: He is alert and oriented to person, place, and time.  Psychiatric:        Mood and Affect: Mood normal.        Behavior: Behavior normal.     Assessment: *Duodenal ulcer-questionable malignancy *Acute upper GI bleeding status post coil embolization of  pseudoaneurysm *Acute blood loss anemia  Plan: Discussed case in depth with patient and sisters who are with him today.  Ulcer biospies negative for malignancy, CA 19-9 elevated at 81 on 4/3, CEA negative.  I will order CT pancreas protocol today to further evaluate.  Repeat EGD to be performed in 4 to 6 weeks.  Continue on pantoprazole 40 mg twice daily.  Avoid NSAIDs.  Avoid alcohol  I will request most recent blood work done at [facility].  Further recommendations to follow.  04/23/2022 9:38 AM   Disclaimer: This note was dictated with voice recognition software. Similar sounding words can inadvertently be transcribed and may not be corrected upon review.

## 2022-05-12 ENCOUNTER — Inpatient Hospital Stay: Payer: Medicaid Other | Admitting: Gastroenterology

## 2022-05-15 ENCOUNTER — Ambulatory Visit (HOSPITAL_COMMUNITY)
Admission: RE | Admit: 2022-05-15 | Discharge: 2022-05-15 | Disposition: A | Discharge status: Home / Self Care | Payer: Medicaid Other | Source: Ambulatory Visit | Attending: Internal Medicine | Admitting: Internal Medicine

## 2022-05-15 DIAGNOSIS — D500 Iron deficiency anemia secondary to blood loss (chronic): Secondary | ICD-10-CM | POA: Insufficient documentation

## 2022-05-15 DIAGNOSIS — K264 Chronic or unspecified duodenal ulcer with hemorrhage: Secondary | ICD-10-CM | POA: Insufficient documentation

## 2022-05-15 LAB — POCT I-STAT CREATININE: Creatinine, Ser: 1.5 mg/dL — ABNORMAL HIGH (ref 0.61–1.24)

## 2022-05-15 MED ORDER — IOHEXOL 300 MG/ML  SOLN
100.0000 mL | Freq: Once | INTRAMUSCULAR | Status: AC | PRN
  Administered 2022-05-15: 100 mL via INTRAVENOUS

## 2022-05-19 ENCOUNTER — Telehealth: Payer: Self-pay | Admitting: Internal Medicine

## 2022-05-19 NOTE — Telephone Encounter (Signed)
Wesley Harman, DO   CC   05/19/22  9:43 AM Note CT has not been read yet.  Still awaiting official read from radiologist.  Thank you

## 2022-05-19 NOTE — Telephone Encounter (Signed)
Pt had CT on Friday, calling for results. They said they leave message on nurse's VM. I told them the results may not be available this soon, but I would let the nurse know she had called. [phone]

## 2022-05-19 NOTE — Telephone Encounter (Signed)
Returned the call to the pt's sister Mardene Celeste) and advised that she was not down to give information too. I advised her that when she gets off work to be with the pt so he can at least give me a verbal for this note. If results come today call will take place between 4:30 to 5 pm.

## 2022-05-19 NOTE — Telephone Encounter (Signed)
CT has not been read yet.  Still awaiting official read from radiologist.  Thank you

## 2022-05-20 NOTE — Telephone Encounter (Signed)
Phoned and spoke with the pt himself to get verbal authorization to give his sister Marshun Duva information (procedures notes, dr notes, etc) until he comes into the office to sign paperwork to have her added. I advised that the CT results have not been sent to me yet but I will call once I receive them. They expressed understanding.

## 2022-05-27 ENCOUNTER — Telehealth: Payer: Self-pay | Admitting: Internal Medicine

## 2022-05-27 ENCOUNTER — Encounter: Payer: Self-pay | Admitting: *Deleted

## 2022-05-27 ENCOUNTER — Ambulatory Visit (INDEPENDENT_AMBULATORY_CARE_PROVIDER_SITE_OTHER): Payer: Medicaid Other | Admitting: Internal Medicine

## 2022-05-27 ENCOUNTER — Encounter: Payer: Self-pay | Admitting: Internal Medicine

## 2022-05-27 VITALS — BP 123/83 | HR 82 | Temp 98.0°F | Ht 66.0 in | Wt 156.3 lb

## 2022-05-27 DIAGNOSIS — K264 Chronic or unspecified duodenal ulcer with hemorrhage: Secondary | ICD-10-CM | POA: Diagnosis not present

## 2022-05-27 DIAGNOSIS — D500 Iron deficiency anemia secondary to blood loss (chronic): Secondary | ICD-10-CM

## 2022-05-27 DIAGNOSIS — R978 Other abnormal tumor markers: Secondary | ICD-10-CM | POA: Diagnosis not present

## 2022-05-27 NOTE — H&P (View-Only) (Signed)
Referring Provider: Jerel Shepherd, FNP Primary Care Physician:  Jerel Shepherd, FNP Primary GI:  Dr. Abbey Chatters  Chief complaint: Hospital follow-up visit, anemia, duodenal ulcer   HPI:   Wesley Francis is a 56 y.o. male who presents to clinic today for follow-up visit.    Patient discharged from [facility] on 03/13/22 after a 4-day stay for GI bleeding, underwent EGD 03/10/2022  which showed congested mucosa of the pylorus, biopsies with reactive gastropathy.  1 large deeply cratered ulcer in the duodenum where the ampulla should be located, approximately 30 mm in largest dimension, circumferential, some food debris noted.  EGD notes rounded circumferential mass present at the base of the ulcer partially covered with fibrin, questionable adherent clot.     Case was discussed with advanced endoscopy in [facility] who recommended repeat CT scan in 6 weeks as well as repeat EGD in 8 to 10 weeks pending clinical course.   Patient was discharged 03/13/22 after completing 72 hours of PPI infusion.  Hemoglobin was stable at 7.6.  Abdominal pain had resolved.  Was tolerating diet.   Patient presented back to our ER 03/14/22 with melena.  Hemoglobin was 7.5.  He did receive 1 unit PRBCs.  After completing transfusion he reportedly became hypotensive and had a syncopal episode with multiple bloody bowel movements.  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 He was transferred to [facility] and underwent angiogram with coil embolization of pseudoaneurysm at the ulcer region on CTA by interventional radiology.  Discharged 4 days later with stable hemoglobin.  Today, patient states he is doing well.  Maintained on pantoprazole 40 mg twice daily.  Denies any melena hematochezia.  States he feels "great," good appetite.  No alcohol since hospitalization.  Continues to smoke cigarettes.    Underwent repeat CT scan of his pancreas 05/15/2022 showed  decreased wall thickening of the duodenum as well as decreased size of focal outpouching, associated embolization coils with no evidence of residual pseudoaneurysm.  Pancreas appears normal.  No pancreatic ductal dilatation or surrounding phonatory changes.   Past Medical History:  Diagnosis Date   AKI (acute kidney injury) ([place]) 04/04/2016   Anemia    Arthritis    Asthma    ETOH abuse    Folate deficiency 04/05/2016   Heme positive stool 04/05/2016   Hypertension     Past Surgical History:  Procedure Laterality Date   BIOPSY  03/10/2022   Procedure: BIOPSY;  Surgeon: Harvel Quale, MD;  [facility];  Service: Gastroenterology;;   COLONOSCOPY     COLONOSCOPY WITH PROPOFOL N/A 03/02/2017   Procedure: COLONOSCOPY WITH PROPOFOL;  Surgeon: Lollie Sails, MD;  Location: [facility] ENDOSCOPY;  Service: Endoscopy;  Laterality: N/A;   COLONOSCOPY WITH PROPOFOL N/A 07/08/2020   Procedure: COLONOSCOPY WITH PROPOFOL;  Surgeon: Lesly Rubenstein, MD;  Location: [facility] ENDOSCOPY;  Service: Endoscopy;  Laterality: N/A;   ESOPHAGOGASTRODUODENOSCOPY (EGD) WITH PROPOFOL N/A 07/08/2020   Procedure: ESOPHAGOGASTRODUODENOSCOPY (EGD) WITH PROPOFOL;  Surgeon: Lesly Rubenstein, MD;  Location: [facility] ENDOSCOPY;  Service: Endoscopy;  Laterality: N/A;   ESOPHAGOGASTRODUODENOSCOPY (EGD) WITH PROPOFOL N/A 03/10/2022   Procedure: ESOPHAGOGASTRODUODENOSCOPY (EGD) WITH PROPOFOL;  Surgeon: Harvel Quale, MD;  [facility]: [facility];  Service: Gastroenterology;  Laterality: N/A;   FRACTURE SURGERY     IR ANGIOGRAM FOLLOW UP STUDY  03/15/2022   IR ANGIOGRAM SELECTIVE EACH ADDITIONAL VESSEL  03/15/2022  IR ANGIOGRAM VISCERAL SELECTIVE  03/15/2022   IR ANGIOGRAM VISCERAL SELECTIVE  03/15/2022   IR EMBO ART  VEN HEMORR LYMPH EXTRAV  INC GUIDE ROADMAPPING  03/15/2022   IR US GUIDE VASC ACCESS RIGHT  03/15/2022   KNEE ARTHROCENTESIS      Current Outpatient Medications  Medication Sig Dispense Refill    acetaminophen (TYLENOL) 325 MG tablet Take 650 mg by mouth every 6 (six) hours as needed for mild pain.     albuterol (PROVENTIL HFA;VENTOLIN HFA) 108 (90 Base) MCG/ACT inhaler Inhale 2 puffs into the lungs every 6 (six) hours as needed for wheezing or shortness of breath. 1 Inhaler 2   amLODipine (NORVASC) 5 MG tablet Take 1 tablet (5 mg total) by mouth daily. 30 tablet 0   Cholecalciferol (VITAMIN D) 2000 units CAPS Take 1 capsule by mouth every other day.     ferrous sulfate 325 (65 FE) MG EC tablet Take 325 mg by mouth 2 (two) times daily.     folic acid (FOLVITE) 1 MG tablet Take 1 tablet (1 mg total) by mouth daily. 30 tablet 0   metoCLOPramide (REGLAN) 10 MG tablet Take 1 tablet (10 mg total) by mouth 3 (three) times daily as needed (hiccups). 20 tablet 0   pantoprazole (PROTONIX) 40 MG tablet Take 1 tablet (40 mg total) by mouth 2 (two) times daily. 60 tablet 2   sucralfate (CARAFATE) 1 GM/10ML suspension Take 10 mLs (1 g total) by mouth 4 (four) times daily -  with meals and at bedtime. 420 mL 0   thiamine 100 MG tablet Take 1 tablet (100 mg total) by mouth daily. 30 tablet 0   No current facility-administered medications for this visit.    Allergies as of 05/27/2022   (No Known Allergies)    Family History  Problem Relation Age of Onset   Hypertension Mother    Stroke Mother    Hypertension Father     Social History   Socioeconomic History   Marital status: Single    Spouse name: Not on file   Number of children: Not on file   Years of education: Not on file   Highest education level: Not on file  Occupational History   Not on file  Tobacco Use   Smoking status: Every Day    Packs/day: 0.50    Types: Cigarettes    Passive exposure: Current   Smokeless tobacco: Never  Vaping Use   Vaping Use: Never used  Substance and Sexual Activity   Alcohol use: Yes    Alcohol/week: 4.0 standard drinks of alcohol    Types: 4 Cans of beer per week   Drug use: No   Sexual  activity: Not on file  Other Topics Concern   Not on file  Social History Narrative   Not on file   Social Determinants of Health   Financial Resource Strain: Not on file  Food Insecurity: Not on file  Transportation Needs: Not on file  Physical Activity: Not on file  Stress: Not on file  Social Connections: Not on file    Subjective: Review of Systems  Constitutional:  Negative for chills and fever.  HENT:  Negative for congestion and hearing loss.   Eyes:  Negative for blurred vision and double vision.  Respiratory:  Negative for cough and shortness of breath.   Cardiovascular:  Negative for chest pain and palpitations.  Gastrointestinal:  Negative for abdominal pain, blood in stool, constipation, diarrhea, heartburn, melena and vomiting.  Genitourinary:  Negative for dysuria and urgency.  Musculoskeletal:  Negative for joint pain and myalgias.  Skin:  Negative for itching and rash.  Neurological:  Negative for dizziness and headaches.  Psychiatric/Behavioral:  Negative for depression. The patient is not nervous/anxious.      Objective: BP 123/83 (BP Location: Left Arm, Patient Position: Sitting, Cuff Size: Normal)   Pulse 82   Temp 98 F (36.7 C) (Oral)   Ht '5\' 6"'$  (1.676 m)   Wt 156 lb 4.8 oz (70.9 kg)   BMI 25.23 kg/m  Physical Exam Constitutional:      Appearance: Normal appearance.  HENT:     Head: Normocephalic and atraumatic.  Eyes:     Extraocular Movements: Extraocular movements intact.     Conjunctiva/sclera: Conjunctivae normal.  Cardiovascular:     Rate and Rhythm: Normal rate and regular rhythm.  Pulmonary:     Effort: Pulmonary effort is normal.     Breath sounds: Normal breath sounds.  Abdominal:     General: Bowel sounds are normal.     Palpations: Abdomen is soft.  Musculoskeletal:        General: Normal range of motion.     Cervical back: Normal range of motion and neck supple.  Skin:    General: Skin is warm.  Neurological:      General: No focal deficit present.     Mental Status: He is alert and oriented to person, place, and time.  Psychiatric:        Mood and Affect: Mood normal.        Behavior: Behavior normal.      Assessment: *Duodenal ulcer-questionable malignancy *Acute upper GI bleeding status post coil embolization of pseudoaneurysm *Acute blood loss anemia  Plan: Discussed case in depth with patient and sisters who are with him today.  Ulcer biospies negative for malignancy, CA 19-9 elevated at 81 on 4/3, CEA negative.  Repeat CT pancreas 05/15/2022 much improved.  We will schedule for repeat EGD today. The risks including infection, bleed, or perforation as well as benefits, limitations, alternatives and imponderables have been reviewed with the patient. Potential for esophageal dilation, biopsy, etc. have also been reviewed.  Questions have been answered. All parties agreeable.  Continue on pantoprazole 40 mg twice daily.  Avoid NSAIDs.  Avoid alcohol.  Work on tobacco cessation  I will check CBC and CMP today.  Further recommendations to follow.  05/27/2022 9:47 AM   Disclaimer: This note was dictated with voice recognition software. Similar sounding words can inadvertently be transcribed and may not be corrected upon review.

## 2022-05-27 NOTE — Telephone Encounter (Signed)
Called pt. He asked I speak with his sister. Pt scheduled for 7/17 at 11:45am, aware will mail instructions/pre-op appt. Offered several other dates sooner but unable to do.

## 2022-05-27 NOTE — Telephone Encounter (Signed)
Can we please schedule this patient for EGD, ASA 3, diagnosis duodenal ulcer, GI bleed.  Thank you

## 2022-05-27 NOTE — Progress Notes (Signed)
Referring Provider: Jerel Shepherd, FNP Primary Care Physician:  Jerel Shepherd, FNP Primary GI:  Dr. Abbey Chatters  Chief complaint: Hospital follow-up visit, anemia, duodenal ulcer   HPI:   Wesley Francis is a 56 y.o. male who presents to clinic today for follow-up visit.    Patient discharged from [facility] on 03/13/22 after a 4-day stay for GI bleeding, underwent EGD 03/10/2022  which showed congested mucosa of the pylorus, biopsies with reactive gastropathy.  1 large deeply cratered ulcer in the duodenum where the ampulla should be located, approximately 30 mm in largest dimension, circumferential, some food debris noted.  EGD notes rounded circumferential mass present at the base of the ulcer partially covered with fibrin, questionable adherent clot.     Case was discussed with advanced endoscopy in [facility] who recommended repeat CT scan in 6 weeks as well as repeat EGD in 8 to 10 weeks pending clinical course.   Patient was discharged 03/13/22 after completing 72 hours of PPI infusion.  Hemoglobin was stable at 7.6.  Abdominal pain had resolved.  Was tolerating diet.   Patient presented back to our ER 03/14/22 with melena.  Hemoglobin was 7.5.  He did receive 1 unit PRBCs.  After completing transfusion he reportedly became hypotensive and had a syncopal episode with multiple bloody bowel movements.  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 He was transferred to [facility] and underwent angiogram with coil embolization of pseudoaneurysm at the ulcer region on CTA by interventional radiology.  Discharged 4 days later with stable hemoglobin.  Today, patient states he is doing well.  Maintained on pantoprazole 40 mg twice daily.  Denies any melena hematochezia.  States he feels "great," good appetite.  No alcohol since hospitalization.  Continues to smoke cigarettes.    Underwent repeat CT scan of his pancreas 05/15/2022 showed  decreased wall thickening of the duodenum as well as decreased size of focal outpouching, associated embolization coils with no evidence of residual pseudoaneurysm.  Pancreas appears normal.  No pancreatic ductal dilatation or surrounding phonatory changes.   Past Medical History:  Diagnosis Date   AKI (acute kidney injury) ([place]) 04/04/2016   Anemia    Arthritis    Asthma    ETOH abuse    Folate deficiency 04/05/2016   Heme positive stool 04/05/2016   Hypertension     Past Surgical History:  Procedure Laterality Date   BIOPSY  03/10/2022   Procedure: BIOPSY;  Surgeon: Harvel Quale, MD;  [facility]: [facility];  Service: Gastroenterology;;   COLONOSCOPY     COLONOSCOPY WITH PROPOFOL N/A 03/02/2017   Procedure: COLONOSCOPY WITH PROPOFOL;  Surgeon: Lollie Sails, MD;  Location: [facility] ENDOSCOPY;  Service: Endoscopy;  Laterality: N/A;   COLONOSCOPY WITH PROPOFOL N/A 07/08/2020   Procedure: COLONOSCOPY WITH PROPOFOL;  Surgeon: Lesly Rubenstein, MD;  Location: [facility] ENDOSCOPY;  Service: Endoscopy;  Laterality: N/A;   ESOPHAGOGASTRODUODENOSCOPY (EGD) WITH PROPOFOL N/A 07/08/2020   Procedure: ESOPHAGOGASTRODUODENOSCOPY (EGD) WITH PROPOFOL;  Surgeon: Lesly Rubenstein, MD;  Location: [facility] ENDOSCOPY;  Service: Endoscopy;  Laterality: N/A;   ESOPHAGOGASTRODUODENOSCOPY (EGD) WITH PROPOFOL N/A 03/10/2022   Procedure: ESOPHAGOGASTRODUODENOSCOPY (EGD) WITH PROPOFOL;  Surgeon: Harvel Quale, MD;  Location: [facility] ENDO [facility];  Service: Gastroenterology;  Laterality: N/A;   FRACTURE SURGERY     IR ANGIOGRAM FOLLOW UP STUDY  03/15/2022   IR ANGIOGRAM SELECTIVE EACH ADDITIONAL VESSEL  03/15/2022  IR ANGIOGRAM VISCERAL SELECTIVE  03/15/2022   IR ANGIOGRAM VISCERAL SELECTIVE  03/15/2022   IR EMBO ART  VEN HEMORR LYMPH EXTRAV  INC GUIDE ROADMAPPING  03/15/2022   IR US GUIDE VASC ACCESS RIGHT  03/15/2022   KNEE ARTHROCENTESIS      Current Outpatient Medications  Medication Sig Dispense Refill    acetaminophen (TYLENOL) 325 MG tablet Take 650 mg by mouth every 6 (six) hours as needed for mild pain.     albuterol (PROVENTIL HFA;VENTOLIN HFA) 108 (90 Base) MCG/ACT inhaler Inhale 2 puffs into the lungs every 6 (six) hours as needed for wheezing or shortness of breath. 1 Inhaler 2   amLODipine (NORVASC) 5 MG tablet Take 1 tablet (5 mg total) by mouth daily. 30 tablet 0   Cholecalciferol (VITAMIN D) 2000 units CAPS Take 1 capsule by mouth every other day.     ferrous sulfate 325 (65 FE) MG EC tablet Take 325 mg by mouth 2 (two) times daily.     folic acid (FOLVITE) 1 MG tablet Take 1 tablet (1 mg total) by mouth daily. 30 tablet 0   metoCLOPramide (REGLAN) 10 MG tablet Take 1 tablet (10 mg total) by mouth 3 (three) times daily as needed (hiccups). 20 tablet 0   pantoprazole (PROTONIX) 40 MG tablet Take 1 tablet (40 mg total) by mouth 2 (two) times daily. 60 tablet 2   sucralfate (CARAFATE) 1 GM/10ML suspension Take 10 mLs (1 g total) by mouth 4 (four) times daily -  with meals and at bedtime. 420 mL 0   thiamine 100 MG tablet Take 1 tablet (100 mg total) by mouth daily. 30 tablet 0   No current facility-administered medications for this visit.    Allergies as of 05/27/2022   (No Known Allergies)    Family History  Problem Relation Age of Onset   Hypertension Mother    Stroke Mother    Hypertension Father     Social History   Socioeconomic History   Marital status: Single    Spouse name: Not on file   Number of children: Not on file   Years of education: Not on file   Highest education level: Not on file  Occupational History   Not on file  Tobacco Use   Smoking status: Every Day    Packs/day: 0.50    Types: Cigarettes    Passive exposure: Current   Smokeless tobacco: Never  Vaping Use   Vaping Use: Never used  Substance and Sexual Activity   Alcohol use: Yes    Alcohol/week: 4.0 standard drinks of alcohol    Types: 4 Cans of beer per week   Drug use: No   Sexual  activity: Not on file  Other Topics Concern   Not on file  Social History Narrative   Not on file   Social Determinants of Health   Financial Resource Strain: Not on file  Food Insecurity: Not on file  Transportation Needs: Not on file  Physical Activity: Not on file  Stress: Not on file  Social Connections: Not on file    Subjective: Review of Systems  Constitutional:  Negative for chills and fever.  HENT:  Negative for congestion and hearing loss.   Eyes:  Negative for blurred vision and double vision.  Respiratory:  Negative for cough and shortness of breath.   Cardiovascular:  Negative for chest pain and palpitations.  Gastrointestinal:  Negative for abdominal pain, blood in stool, constipation, diarrhea, heartburn, melena and vomiting.  Genitourinary:  Negative for dysuria and urgency.  Musculoskeletal:  Negative for joint pain and myalgias.  Skin:  Negative for itching and rash.  Neurological:  Negative for dizziness and headaches.  Psychiatric/Behavioral:  Negative for depression. The patient is not nervous/anxious.      Objective: BP 123/83 (BP Location: Left Arm, Patient Position: Sitting, Cuff Size: Normal)   Pulse 82   Temp 98 F (36.7 C) (Oral)   Ht '5\' 6"'$  (1.676 m)   Wt 156 lb 4.8 oz (70.9 kg)   BMI 25.23 kg/m  Physical Exam Constitutional:      Appearance: Normal appearance.  HENT:     Head: Normocephalic and atraumatic.  Eyes:     Extraocular Movements: Extraocular movements intact.     Conjunctiva/sclera: Conjunctivae normal.  Cardiovascular:     Rate and Rhythm: Normal rate and regular rhythm.  Pulmonary:     Effort: Pulmonary effort is normal.     Breath sounds: Normal breath sounds.  Abdominal:     General: Bowel sounds are normal.     Palpations: Abdomen is soft.  Musculoskeletal:        General: Normal range of motion.     Cervical back: Normal range of motion and neck supple.  Skin:    General: Skin is warm.  Neurological:      General: No focal deficit present.     Mental Status: He is alert and oriented to person, place, and time.  Psychiatric:        Mood and Affect: Mood normal.        Behavior: Behavior normal.      Assessment: *Duodenal ulcer-questionable malignancy *Acute upper GI bleeding status post coil embolization of pseudoaneurysm *Acute blood loss anemia  Plan: Discussed case in depth with patient and sisters who are with him today.  Ulcer biospies negative for malignancy, CA 19-9 elevated at 81 on 4/3, CEA negative.  Repeat CT pancreas 05/15/2022 much improved.  We will schedule for repeat EGD today. The risks including infection, bleed, or perforation as well as benefits, limitations, alternatives and imponderables have been reviewed with the patient. Potential for esophageal dilation, biopsy, etc. have also been reviewed.  Questions have been answered. All parties agreeable.  Continue on pantoprazole 40 mg twice daily.  Avoid NSAIDs.  Avoid alcohol.  Work on tobacco cessation  I will check CBC and CMP today.  Further recommendations to follow.  05/27/2022 9:47 AM   Disclaimer: This note was dictated with voice recognition software. Similar sounding words can inadvertently be transcribed and may not be corrected upon review.

## 2022-05-27 NOTE — Patient Instructions (Signed)
I am going to check blood work at [facility].  We likely will need to proceed with upper endoscopy to evaluate healing of your ulcer.  We will make a decision after I get CT scan results back, hopefully today.  I will call you once I have them.  Continue on pantoprazole twice daily.  Avoid NSAIDs.  It was very nice seeing both of you today.  Dr. Abbey Chatters

## 2022-05-28 LAB — COMPLETE METABOLIC PANEL WITH GFR
AG Ratio: 1.5 (calc) (ref 1.0–2.5)
ALT: 10 U/L (ref 9–46)
AST: 17 U/L (ref 10–35)
Albumin: 4.3 g/dL (ref 3.6–5.1)
Alkaline phosphatase (APISO): 31 U/L — ABNORMAL LOW (ref 35–144)
BUN: 20 mg/dL (ref 7–25)
CO2: 16 mmol/L — ABNORMAL LOW (ref 20–32)
Calcium: 10 mg/dL (ref 8.6–10.3)
Chloride: 108 mmol/L (ref 98–110)
Creat: 1.25 mg/dL (ref 0.70–1.30)
Globulin: 2.9 g/dL (calc) (ref 1.9–3.7)
Glucose, Bld: 79 mg/dL (ref 65–99)
Potassium: 4.6 mmol/L (ref 3.5–5.3)
Sodium: 137 mmol/L (ref 135–146)
Total Bilirubin: 0.5 mg/dL (ref 0.2–1.2)
Total Protein: 7.2 g/dL (ref 6.1–8.1)
eGFR: 68 mL/min/{1.73_m2} (ref >=60)

## 2022-05-28 LAB — CBC
HCT: 40.7 % (ref 38.5–50.0)
Hemoglobin: 12 g/dL — ABNORMAL LOW (ref 13.2–17.1)
MCH: 23.8 pg — ABNORMAL LOW (ref 27.0–33.0)
MCHC: 29.5 g/dL — ABNORMAL LOW (ref 32.0–36.0)
MCV: 80.6 fL (ref 80.0–100.0)
MPV: 9.7 fL (ref 7.5–12.5)
Platelets: 289 10*3/uL (ref 140–400)
RBC: 5.05 10*6/uL (ref 4.20–5.80)
RDW: 14.1 % (ref 11.0–15.0)
WBC: 6.3 10*3/uL (ref 3.8–10.8)

## 2022-06-02 ENCOUNTER — Ambulatory Visit (HOSPITAL_COMMUNITY): Payer: Medicaid Other

## 2022-06-10 ENCOUNTER — Inpatient Hospital Stay: Payer: Medicaid Other | Admitting: Gastroenterology

## 2022-06-15 NOTE — Patient Instructions (Addendum)
AXIEL FJELD  06/15/2022     '@PREFPERIOPPHARMACY'$ @   Your procedure is scheduled on  06/22/2022.   Report to [facility] at  [place].   Call this number if you have problems the morning of surgery:  [phone]   Remember:  Follow the diet instructions given to you by the office.     Use your inhaler before you come and bring your rescue inhaler with you.    Take these medicines the morning of surgery with A SIP OF WATER                                    Protonix.    Do not wear jewelry, make-up or nail polish.  Do not wear lotions, powders, or perfumes, or deodorant.  Do not shave 48 hours prior to surgery.  Men may shave face and neck.  Do not bring valuables to the hospital.  [facility] is not responsible for any belongings or valuables.  Contacts, dentures or bridgework may not be worn into surgery.  Leave your suitcase in the car.  After surgery it may be brought to your room.  For patients admitted to the hospital, discharge time will be determined by your treatment team.  Patients discharged the day of surgery will not be allowed to drive home and must have someone with them for 24 hours.    Special instructions:   DO NOT smoke tobacco or vape for 24 hours before before your procedure.  Please read over the following fact sheets that you were given. Anesthesia Post-op Instructions and Care and Recovery After Surgery      Upper Endoscopy, Adult, Care After This sheet gives you information about how to care for yourself after your procedure. Your health care provider may also give you more specific instructions. If you have problems or questions, contact your health care provider. What can I expect after the procedure? After the procedure, it is common to have: A sore throat. Mild stomach pain or discomfort. Bloating. Nausea. Follow these instructions at home:  Follow instructions from your health care provider about what to eat or drink after  your procedure. Return to your normal activities as told by your health care provider. Ask your health care provider what activities are safe for you. Take over-the-counter and prescription medicines only as told by your health care provider. If you were given a sedative during the procedure, it can affect you for several hours. Do not drive or operate machinery until your health care provider says that it is safe. Keep all follow-up visits as told by your health care provider. This is important. Contact a health care provider if you have: A sore throat that lasts longer than one day. Trouble swallowing. Get help right away if: You vomit blood or your vomit looks like coffee grounds. You have: A fever. Bloody, black, or tarry stools. A severe sore throat or you cannot swallow. Difficulty breathing. Severe pain in your chest or abdomen. Summary After the procedure, it is common to have a sore throat, mild stomach discomfort, bloating, and nausea. If you were given a sedative during the procedure, it can affect you for several hours. Do not drive or operate machinery until your health care provider says that it is safe. Follow instructions from your health care provider about what to eat or drink after your procedure. Return to your  normal activities as told by your health care provider. This information is not intended to replace advice given to you by your health care provider. Make sure you discuss any questions you have with your health care provider. Document Revised: 09/29/2019 Document Reviewed: 04/25/2018 Elsevier Patient Education  [place] After This sheet gives you information about how to care for yourself after your procedure. Your health care provider may also give you more specific instructions. If you have problems or questions, contact your health care provider. What can I expect after the procedure? After the procedure, it is  common to have: Tiredness. Forgetfulness about what happened after the procedure. Impaired judgment for important decisions. Nausea or vomiting. Some difficulty with balance. Follow these instructions at home: For the time period you were told by your health care provider:     Rest as needed. Do not participate in activities where you could fall or become injured. Do not drive or use machinery. Do not drink alcohol. Do not take sleeping pills or medicines that cause drowsiness. Do not make important decisions or sign legal documents. Do not take care of children on your own. Eating and drinking Follow the diet that is recommended by your health care provider. Drink enough fluid to keep your urine pale yellow. If you vomit: Drink water, juice, or soup when you can drink without vomiting. Make sure you have little or no nausea before eating solid foods. General instructions Have a responsible adult stay with you for the time you are told. It is important to have someone help care for you until you are awake and alert. Take over-the-counter and prescription medicines only as told by your health care provider. If you have sleep apnea, surgery and certain medicines can increase your risk for breathing problems. Follow instructions from your health care provider about wearing your sleep device: Anytime you are sleeping, including during daytime naps. While taking prescription pain medicines, sleeping medicines, or medicines that make you drowsy. Avoid smoking. Keep all follow-up visits as told by your health care provider. This is important. Contact a health care provider if: You keep feeling nauseous or you keep vomiting. You feel light-headed. You are still sleepy or having trouble with balance after 24 hours. You develop a rash. You have a fever. You have redness or swelling around the IV site. Get help right away if: You have trouble breathing. You have new-onset confusion at  home. Summary For several hours after your procedure, you may feel tired. You may also be forgetful and have poor judgment. Have a responsible adult stay with you for the time you are told. It is important to have someone help care for you until you are awake and alert. Rest as told. Do not drive or operate machinery. Do not drink alcohol or take sleeping pills. Get help right away if you have trouble breathing, or if you suddenly become confused. This information is not intended to replace advice given to you by your health care provider. Make sure you discuss any questions you have with your health care provider. Document Revised: 10/28/2021 Document Reviewed: 10/26/2019 Elsevier Patient Education  [place].

## 2022-06-17 ENCOUNTER — Encounter (HOSPITAL_COMMUNITY)
Admission: RE | Admit: 2022-06-17 | Discharge: 2022-06-17 | Disposition: A | Discharge status: Home / Self Care | Payer: Medicaid Other | Source: Ambulatory Visit | Attending: Internal Medicine | Admitting: Internal Medicine

## 2022-06-17 DIAGNOSIS — Z01810 Encounter for preprocedural cardiovascular examination: Secondary | ICD-10-CM | POA: Insufficient documentation

## 2022-06-17 DIAGNOSIS — I10 Essential (primary) hypertension: Secondary | ICD-10-CM | POA: Diagnosis not present

## 2022-06-22 ENCOUNTER — Ambulatory Visit (HOSPITAL_BASED_OUTPATIENT_CLINIC_OR_DEPARTMENT_OTHER): Payer: Medicaid Other | Admitting: Anesthesiology

## 2022-06-22 ENCOUNTER — Ambulatory Visit (HOSPITAL_COMMUNITY): Payer: Medicaid Other | Admitting: Anesthesiology

## 2022-06-22 ENCOUNTER — Ambulatory Visit (HOSPITAL_COMMUNITY)
Admission: RE | Admit: 2022-06-22 | Discharge: 2022-06-22 | Disposition: A | Discharge status: Home / Self Care | Payer: Medicaid Other | Attending: Internal Medicine | Admitting: Internal Medicine

## 2022-06-22 ENCOUNTER — Encounter (HOSPITAL_COMMUNITY)
Admission: RE | Disposition: A | Discharge status: Home / Self Care | Payer: Self-pay | Source: Home / Self Care | Attending: Internal Medicine

## 2022-06-22 ENCOUNTER — Encounter (HOSPITAL_COMMUNITY): Payer: Self-pay

## 2022-06-22 DIAGNOSIS — K2970 Gastritis, unspecified, without bleeding: Secondary | ICD-10-CM

## 2022-06-22 DIAGNOSIS — K3189 Other diseases of stomach and duodenum: Secondary | ICD-10-CM | POA: Diagnosis not present

## 2022-06-22 DIAGNOSIS — K449 Diaphragmatic hernia without obstruction or gangrene: Secondary | ICD-10-CM | POA: Insufficient documentation

## 2022-06-22 DIAGNOSIS — Z8711 Personal history of peptic ulcer disease: Secondary | ICD-10-CM | POA: Insufficient documentation

## 2022-06-22 DIAGNOSIS — I10 Essential (primary) hypertension: Secondary | ICD-10-CM | POA: Insufficient documentation

## 2022-06-22 DIAGNOSIS — K279 Peptic ulcer, site unspecified, unspecified as acute or chronic, without hemorrhage or perforation: Secondary | ICD-10-CM

## 2022-06-22 DIAGNOSIS — F17210 Nicotine dependence, cigarettes, uncomplicated: Secondary | ICD-10-CM | POA: Insufficient documentation

## 2022-06-22 DIAGNOSIS — J45909 Unspecified asthma, uncomplicated: Secondary | ICD-10-CM | POA: Diagnosis not present

## 2022-06-22 DIAGNOSIS — M1990 Unspecified osteoarthritis, unspecified site: Secondary | ICD-10-CM | POA: Diagnosis not present

## 2022-06-22 DIAGNOSIS — Z79899 Other long term (current) drug therapy: Secondary | ICD-10-CM | POA: Insufficient documentation

## 2022-06-22 DIAGNOSIS — K264 Chronic or unspecified duodenal ulcer with hemorrhage: Secondary | ICD-10-CM

## 2022-06-22 DIAGNOSIS — R948 Abnormal results of function studies of other organs and systems: Secondary | ICD-10-CM | POA: Diagnosis present

## 2022-06-22 HISTORY — PX: ESOPHAGOGASTRODUODENOSCOPY (EGD) WITH PROPOFOL: SHX5813

## 2022-06-22 SURGERY — ESOPHAGOGASTRODUODENOSCOPY (EGD) WITH PROPOFOL
Anesthesia: General

## 2022-06-22 MED ORDER — PROPOFOL 10 MG/ML IV BOLUS
INTRAVENOUS | Status: DC | PRN
  Administered 2022-06-22: 100 mg via INTRAVENOUS
  Administered 2022-06-22 (×2): 30 mg via INTRAVENOUS

## 2022-06-22 MED ORDER — LIDOCAINE HCL (CARDIAC) PF 100 MG/5ML IV SOSY
PREFILLED_SYRINGE | INTRAVENOUS | Status: DC | PRN
  Administered 2022-06-22: 50 mg via INTRAVENOUS

## 2022-06-22 MED ORDER — LACTATED RINGERS IV SOLN
INTRAVENOUS | Status: DC
Start: 2022-06-22 — End: 2022-06-22
  Administered 2022-06-22: 1000 mL via INTRAVENOUS

## 2022-06-22 NOTE — Discharge Instructions (Addendum)
EGD Discharge instructions Please read the instructions outlined below and refer to this sheet in the next few weeks. These discharge instructions provide you with general information on caring for yourself after you leave the hospital. Your doctor may also give you specific instructions. While your treatment has been planned according to the most current medical practices available, unavoidable complications occasionally occur. If you have any problems or questions after discharge, please call your doctor. ACTIVITY You may resume your regular activity but move at a slower pace for the next 24 hours.  Take frequent rest periods for the next 24 hours.  Walking will help expel (get rid of) the air and reduce the bloated feeling in your abdomen.  No driving for 24 hours (because of the anesthesia (medicine) used during the test).  You may shower.  Do not sign any important legal documents or operate any machinery for 24 hours (because of the anesthesia used during the test).  NUTRITION Drink plenty of fluids.  You may resume your normal diet.  Begin with a light meal and progress to your normal diet.  Avoid alcoholic beverages for 24 hours or as instructed by your caregiver.  MEDICATIONS You may resume your normal medications unless your caregiver tells you otherwise.  WHAT YOU CAN EXPECT TODAY You may experience abdominal discomfort such as a feeling of fullness or "gas" pains.  FOLLOW-UP Your doctor will discuss the results of your test with you.  SEEK IMMEDIATE MEDICAL ATTENTION IF ANY OF THE FOLLOWING OCCUR: Excessive nausea (feeling sick to your stomach) and/or vomiting.  Severe abdominal pain and distention (swelling).  Trouble swallowing.  Temperature over 101 F (37.8 C).  Rectal bleeding or vomiting of blood.   Previously noted large ulcer in your small bowel has almost completely healed.  Continue on pantoprazole.  Okay to decrease to once daily.  You can take Carafate as needed  for any abdominal discomfort.  Avoid all NSAIDs.  Follow-up with GI in 6 months.   I hope you have a great rest of your week!  Wesley Francis. Abbey Chatters, D.O. Gastroenterology and Hepatology [facility]

## 2022-06-22 NOTE — Op Note (Signed)
[facility] Patient Name: Wesley Francis Procedure Date: 06/22/2022 11:34 AM MRN: [id number] Date of Birth: [birth year] Attending MD: Elon Alas. Abbey Chatters DO CSN: [id number] Age: 56 Admit Type: Outpatient Procedure:                Upper GI endoscopy Indications:              Follow-up of peptic ulcer, Abnormal CT of the GI                            tract Providers:                Elon Alas. Abbey Chatters, DO, Lambert Mody, Angela A.                            Theda Sers RN, RN Referring MD:              Medicines:                See the Anesthesia note for documentation of the                            administered medications Complications:            No immediate complications. Estimated Blood Loss:     Estimated blood loss: none. Procedure:                Pre-Anesthesia Assessment:                           - The anesthesia plan was to use monitored                            anesthesia care (MAC).                           After obtaining informed consent, the endoscope was                            passed under direct vision. Throughout the                            procedure, the patient's blood pressure, pulse, and                            oxygen saturations were monitored continuously. The                            [id number]-H190 ([id number]) scope was introduced through the                            mouth, and advanced to the second part of duodenum.                            The upper GI endoscopy was accomplished without                            difficulty. The patient tolerated  the procedure                            well. Scope In: 11:49:37 AM Scope Out: 11:53:20 AM Total Procedure Duration: 0 hours 3 minutes 43 seconds  Findings:      A medium-sized hiatal hernia was present.      Patchy mild inflammation characterized by erythema was found in the       entire examined stomach.      An almost completely healed ulcer scar was found in the second portion       of  the duodenum. Impression:               - Medium-sized hiatal hernia.                           - Gastritis.                           - Duodenal scar.                           - No specimens collected. Moderate Sedation:      Per Anesthesia Care Recommendation:           - Patient has a contact number available for                            emergencies. The signs and symptoms of potential                            delayed complications were discussed with the                            patient. Return to normal activities tomorrow.                            Written discharge instructions were provided to the                            patient.                           - Resume previous diet.                           - Continue present medications.                           - Use Protonix (pantoprazole) 40 mg PO daily.                           - No ibuprofen, naproxen, or other non-steroidal                            anti-inflammatory drugs.                           - Return to GI clinic in 6 months. Procedure Code(s):        ---  Professional ---                           [phone], Esophagogastroduodenoscopy, flexible,                            transoral; diagnostic, including collection of                            specimen(s) by brushing or washing, when performed                            (separate procedure) Diagnosis Code(s):        --- Professional ---                           K44.9, Diaphragmatic hernia without obstruction or                            gangrene                           K29.70, Gastritis, unspecified, without bleeding                           K31.89, Other diseases of stomach and duodenum                           K27.9, Peptic ulcer, site unspecified, unspecified                            as acute or chronic, without hemorrhage or                            perforation                           R93.3, Abnormal findings on diagnostic imaging of                             other parts of digestive tract CPT copyright 2019 American Medical Association. All rights reserved. The codes documented in this report are preliminary and upon coder review may  be revised to meet current compliance requirements. Elon Alas. Abbey Chatters, DO [name], DO 06/22/2022 11:56:41 AM This report has been signed electronically. Number of Addenda: 0

## 2022-06-22 NOTE — Interval H&P Note (Signed)
History and Physical Interval Note:  06/22/2022 10:27 AM  Wesley Francis  has presented today for surgery, with the diagnosis of duodenal ulcer, GI bleed.  The various methods of treatment have been discussed with the patient and family. After consideration of risks, benefits and other options for treatment, the patient has consented to  Procedure(s) with comments: ESOPHAGOGASTRODUODENOSCOPY (EGD) WITH PROPOFOL (N/A) - 11:45am as a surgical intervention.  The patient's history has been reviewed, patient examined, no change in status, stable for surgery.  I have reviewed the patient's chart and labs.  Questions were answered to the patient's satisfaction.     Eloise Harman

## 2022-06-22 NOTE — Transfer of Care (Signed)
Immediate Anesthesia Transfer of Care Note  Patient: Wesley Francis  Procedure(s) Performed: ESOPHAGOGASTRODUODENOSCOPY (EGD) WITH PROPOFOL  Patient Location: PACU  Anesthesia Type:General  Level of Consciousness: drowsy  Airway & Oxygen Therapy: Patient Spontanous Breathing  Post-op Assessment: Report given to RN and Post -op Vital signs reviewed and stable  Post vital signs: Reviewed and stable  Last Vitals:  Vitals Value Taken Time  BP    Temp    Pulse    Resp    SpO2 100%     Last Pain:  Vitals:   06/22/22 1147  TempSrc:   PainSc: 0-No pain      Patients Stated Pain Goal: 8 (25/75/05 1833)  Complications: No notable events documented.

## 2022-06-22 NOTE — Anesthesia Preprocedure Evaluation (Signed)
Anesthesia Evaluation  Patient identified by MRN, date of birth, ID band Patient awake    Reviewed: Allergy & Precautions, NPO status , Patient's Chart, lab work & pertinent test results, reviewed documented beta blocker date and time   Airway Mallampati: II  TM Distance: >3 FB Neck ROM: Full    Dental  (+) Edentulous Upper, Edentulous Lower   Pulmonary asthma , Current Smoker and Patient abstained from smoking.,    Pulmonary exam normal breath sounds clear to auscultation       Cardiovascular Exercise Tolerance: Good hypertension, Pt. 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  Sub aortic/LVOT obstruction. Concerning for HOCM. Left ventricular ejection fraction, by estimation, is 65 to 70%. The left ventricle has normal function. The left ventricle has no regional wall motion abnormalities. There is moderate asymmetric left ventricular hypertrophy. Left ventricular diastolic parameters were normal.  2. Right ventricular systolic function is normal. The right ventricular size is normal. Tricuspid regurgitation signal is inadequate for assessing PA pressure.  3. Mild to moderate mitral valve regurgitation.  4. The aortic valve is grossly normal. Aortic valve regurgitation is not visualized.  5. The inferior vena cava is normal in size with greater than 50% respiratory variability, suggesting right atrial pressure of 3 mmHg.   Comparison(s): No prior Echocardiogram.   Conclusion(s)/Recommendation(s): Image quality challenging. Consider non  urgent CMR for assessment of possible HOCM. Recommend fluid resuscitation  if hypotensive.    Neuro/Psych negative neurological ROS  negative psych ROS   GI/Hepatic PUD, GERD  Medicated and Controlled,(+)     substance abuse  (alcohol abuse)  alcohol use, Hepatitis -  Endo/Other  negative endocrine ROS  Renal/GU Renal disease  negative genitourinary   Musculoskeletal  (+) Arthritis , Osteoarthritis,    Abdominal   Peds negative pediatric ROS (+)  Hematology  (+) Blood dyscrasia, anemia ,   Anesthesia Other Findings   Reproductive/Obstetrics negative OB ROS                            Anesthesia Physical  Anesthesia Plan  ASA: 3  Anesthesia Plan: General   Post-op Pain Management: Minimal or no pain anticipated   Induction: Intravenous  PONV Risk Score and Plan: TIVA  Airway Management Planned: Nasal Cannula and Natural Airway  Additional Equipment:   Intra-op Plan:   Post-operative Plan:   Informed Consent: I have reviewed the patients History and Physical, chart, labs and discussed the procedure including the risks, benefits and alternatives for the proposed anesthesia with the patient or authorized representative who has indicated his/her understanding and acceptance.       Plan Discussed with: CRNA and Surgeon  Anesthesia Plan Comments:         Anesthesia Quick Evaluation

## 2022-06-22 NOTE — Anesthesia Postprocedure Evaluation (Signed)
Anesthesia Post Note  Patient: Wesley Francis  Procedure(s) Performed: ESOPHAGOGASTRODUODENOSCOPY (EGD) WITH PROPOFOL  Patient location during evaluation: Phase II Anesthesia Type: General Level of consciousness: awake and alert and oriented Pain management: pain level controlled Vital Signs Assessment: post-procedure vital signs reviewed and stable Respiratory status: spontaneous breathing, nonlabored ventilation and respiratory function stable Cardiovascular status: blood pressure returned to baseline and stable Postop Assessment: no apparent nausea or vomiting Anesthetic complications: no   No notable events documented.   Last Vitals:  Vitals:   06/22/22 0953 06/22/22 1156  BP: 128/88 94/66  Pulse: (!) 58 78  Resp: 16 16  Temp: 36.8 C 36.7 C  SpO2: 100% 100%    Last Pain:  Vitals:   06/22/22 1156  TempSrc: Oral  PainSc: 0-No pain                 Zanayah Shadowens C Jannice Beitzel

## 2022-06-26 ENCOUNTER — Encounter (HOSPITAL_COMMUNITY): Payer: Self-pay | Admitting: Internal Medicine

## 2022-11-11 ENCOUNTER — Encounter: Payer: Self-pay | Admitting: Internal Medicine

## 2022-11-11 ENCOUNTER — Ambulatory Visit (INDEPENDENT_AMBULATORY_CARE_PROVIDER_SITE_OTHER): Payer: Medicaid Other | Admitting: Internal Medicine

## 2022-11-11 VITALS — BP 135/92 | HR 79 | Temp 97.1°F | Ht 66.0 in | Wt 165.9 lb

## 2022-11-11 DIAGNOSIS — D500 Iron deficiency anemia secondary to blood loss (chronic): Secondary | ICD-10-CM

## 2022-11-11 DIAGNOSIS — K269 Duodenal ulcer, unspecified as acute or chronic, without hemorrhage or perforation: Secondary | ICD-10-CM

## 2022-11-11 DIAGNOSIS — Z789 Other specified health status: Secondary | ICD-10-CM | POA: Diagnosis not present

## 2022-11-11 NOTE — Patient Instructions (Signed)
I am happy to hear that you are doing well.  Continue on pantoprazole daily.  Avoid NSAIDs such as ibuprofen, Aleve, Goody powders, etc.  Continue iron supplementation.  I will request your most recent blood work done.  I will defer to [facility] as far as monitoring this.  Work on decreasing alcohol use as well as quitting smoking.  Follow-up with GI in 6 months.  It was very nice seeing both you again today.  Dr. Abbey Chatters

## 2022-11-11 NOTE — Progress Notes (Signed)
Referring Provider: Jerel Shepherd, FNP Primary Care Physician:  Jerel Shepherd, FNP Primary GI:  Dr. Abbey Chatters  Chief Complaint  Patient presents with   Follow-up    Patient here today for a follow up on Duodenal Ulcer with hemorrhage, and also follow up on anemia. Patient denies any dark or bloody stool, denies any shortness of breath,dizziness or fatigue. Takes ferrous sulfate bid.      HPI:   Wesley Francis is a 56 y.o. male who presents to clinic today for follow-up visit.    Patient discharged from [facility] on 03/13/22 after a 4-day stay for GI bleeding, underwent EGD 03/10/2022  which showed congested mucosa of the pylorus, biopsies with reactive gastropathy.  1 large deeply cratered ulcer in the duodenum where the ampulla should be located, approximately 30 mm in largest dimension, circumferential, some food debris noted.  EGD notes rounded circumferential mass present at the base of the ulcer partially covered with fibrin, questionable adherent clot.     Case was discussed with advanced endoscopy in [facility] who recommended repeat CT scan in 6 weeks as well as repeat EGD in 8 to 10 weeks pending clinical course.   Patient was discharged 03/13/22 after completing 72 hours of PPI infusion.  Hemoglobin was stable at 7.6.  Abdominal pain had resolved.  Was tolerating diet.   Patient presented back to our ER 03/14/22 with melena.  Hemoglobin was 7.5.  He did receive 1 unit PRBCs.  After completing transfusion he reportedly became hypotensive and had a syncopal episode with multiple bloody bowel movements.  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 He was transferred to [facility] and underwent angiogram with coil embolization of pseudoaneurysm at the ulcer region on CTA by interventional radiology.  Discharged 4 days later with stable hemoglobin.  Underwent repeat CT scan of his pancreas 05/15/2022 showed decreased wall  thickening of the duodenum as well as decreased size of focal outpouching, associated embolization coils with no evidence of residual pseudoaneurysm.  Pancreas appears normal.  No pancreatic ductal dilatation or surrounding phonatory changes.  Repeat EGD 06/22/2022 showed almost complete healing of previously noted large duodenal ulcers.  Currently maintained on pantoprazole 40 mg daily.  Today states he is doing great.  Continues iron supplementation.  States he had blood work done last week which showed hemoglobin 13 I do not have access to this report.  No melena hematochezia.  No abdominal pain.  Notes weight gain.  Continues to drink alcohol and smoke cigarettes.   Past Medical History:  Diagnosis Date   AKI (acute kidney injury) ([place]) 04/04/2016   Anemia    Arthritis    Asthma    ETOH abuse    Folate deficiency 04/05/2016   Heme positive stool 04/05/2016   Hypertension     Past Surgical History:  Procedure Laterality Date   BIOPSY  03/10/2022   Procedure: BIOPSY;  Surgeon: Harvel Quale, MD;  Location: [facility];  Service: Gastroenterology;;   COLONOSCOPY     COLONOSCOPY WITH PROPOFOL N/A 03/02/2017   Procedure: COLONOSCOPY WITH PROPOFOL;  Surgeon: Lollie Sails, MD;  Location[facility] ENDOSCOPY;  Service: Endoscopy;  Laterality: N/A;   COLONOSCOPY WITH PROPOFOL N/A 07/08/2020   Procedure: COLONOSCOPY WITH PROPOFOL;  Surgeon: Lesly Rubenstein, MD;  Location: [facility] ENDOSCOPY;  Service: Endoscopy;  Laterality: N/A;   ESOPHAGOGASTRODUODENOSCOPY (EGD) WITH PROPOFOL N/A 07/08/2020   Procedure: ESOPHAGOGASTRODUODENOSCOPY (  EGD) WITH PROPOFOL;  Surgeon: Lesly Rubenstein, MD;  Location: [facility] ENDOSCOPY;  Service: Endoscopy;  Laterality: N/A;   ESOPHAGOGASTRODUODENOSCOPY (EGD) WITH PROPOFOL N/A 03/10/2022   Procedure: ESOPHAGOGASTRODUODENOSCOPY (EGD) WITH PROPOFOL;  Surgeon: Harvel Quale, MD;  Location: [facility];  Service: Gastroenterology;  Laterality: N/A;    ESOPHAGOGASTRODUODENOSCOPY (EGD) WITH PROPOFOL N/A 06/22/2022   Procedure: ESOPHAGOGASTRODUODENOSCOPY (EGD) WITH PROPOFOL;  Surgeon: Eloise Harman, DO;  Location: [facility];  Service: Endoscopy;  Laterality: N/A;  11:45am   FRACTURE SURGERY     IR ANGIOGRAM FOLLOW UP STUDY  03/15/2022   IR ANGIOGRAM SELECTIVE EACH ADDITIONAL VESSEL  03/15/2022   IR ANGIOGRAM VISCERAL SELECTIVE  03/15/2022   IR ANGIOGRAM VISCERAL SELECTIVE  03/15/2022   IR EMBO ART  VEN HEMORR LYMPH EXTRAV  INC GUIDE ROADMAPPING  03/15/2022   IR US GUIDE VASC ACCESS RIGHT  03/15/2022   KNEE ARTHROCENTESIS      Current Outpatient Medications  Medication Sig Dispense Refill   acetaminophen (TYLENOL) 325 MG tablet Take 650 mg by mouth every 6 (six) hours as needed for mild pain.     albuterol (PROVENTIL HFA;VENTOLIN HFA) 108 (90 Base) MCG/ACT inhaler Inhale 2 puffs into the lungs every 6 (six) hours as needed for wheezing or shortness of breath. 1 Inhaler 2   Cholecalciferol (VITAMIN D) 2000 units CAPS Take 2,000 Units by mouth every other day.     ferrous sulfate 325 (65 FE) MG EC tablet Take 325 mg by mouth 2 (two) times daily.     folic acid (FOLVITE) 1 MG tablet Take 1 tablet (1 mg total) by mouth daily. 30 tablet 0   pantoprazole (PROTONIX) 40 MG tablet Take 40 mg by mouth 2 (two) times daily.     thiamine 100 MG tablet Take 1 tablet (100 mg total) by mouth daily. 30 tablet 0   amLODipine (NORVASC) 5 MG tablet Take 1 tablet (5 mg total) by mouth daily. (Patient not taking: Reported on 06/10/2022) 30 tablet 0   sucralfate (CARAFATE) 1 GM/10ML suspension Take 10 mLs (1 g total) by mouth 4 (four) times daily -  with meals and at bedtime. (Patient not taking: Reported on 11/11/2022) 420 mL 0   No current facility-administered medications for this visit.    Allergies as of 11/11/2022   (No Known Allergies)    Family History  Problem Relation Age of Onset   Hypertension Mother    Stroke Mother    Hypertension Father      Social History   Socioeconomic History   Marital status: Single    Spouse name: Not on file   Number of children: Not on file   Years of education: Not on file   Highest education level: Not on file  Occupational History   Not on file  Tobacco Use   Smoking status: Every Day    Packs/day: 0.50    Types: Cigarettes    Passive exposure: Current   Smokeless tobacco: Never  Vaping Use   Vaping Use: Never used  Substance and Sexual Activity   Alcohol use: Yes    Alcohol/week: 4.0 standard drinks of alcohol    Types: 4 Cans of beer per week   Drug use: No   Sexual activity: Not on file  Other Topics Concern   Not on file  Social History Narrative   Not on file   Social Determinants of Health   Financial Resource Strain: Not on file  Food Insecurity: Not on file  Transportation Needs: Not on file  Physical Activity: Not on file  Stress: Not on file  Social Connections: Not on file    Subjective: Review of Systems  Constitutional:  Negative for chills and fever.  HENT:  Negative for congestion and hearing loss.   Eyes:  Negative for blurred vision and double vision.  Respiratory:  Negative for cough and shortness of breath.   Cardiovascular:  Negative for chest pain and palpitations.  Gastrointestinal:  Negative for abdominal pain, blood in stool, constipation, diarrhea, heartburn, melena and vomiting.  Genitourinary:  Negative for dysuria and urgency.  Musculoskeletal:  Negative for joint pain and myalgias.  Skin:  Negative for itching and rash.  Neurological:  Negative for dizziness and headaches.  Psychiatric/Behavioral:  Negative for depression. The patient is not nervous/anxious.      Objective: BP (!) 135/92 (BP Location: Left Arm, Patient Position: Sitting, Cuff Size: Small)   Pulse 79   Temp (!) 97.1 F (36.2 C) (Temporal)   Ht '5\' 6"'$  (1.676 m)   Wt 165 lb 14.4 oz (75.3 kg)   BMI 26.78 kg/m  Physical Exam Constitutional:      Appearance: Normal  appearance.  HENT:     Head: Normocephalic and atraumatic.  Eyes:     Extraocular Movements: Extraocular movements intact.     Conjunctiva/sclera: Conjunctivae normal.  Cardiovascular:     Rate and Rhythm: Normal rate and regular rhythm.  Pulmonary:     Effort: Pulmonary effort is normal.     Breath sounds: Normal breath sounds.  Abdominal:     General: Bowel sounds are normal.     Palpations: Abdomen is soft.  Musculoskeletal:        General: Normal range of motion.     Cervical back: Normal range of motion and neck supple.  Skin:    General: Skin is warm.  Neurological:     General: No focal deficit present.     Mental Status: He is alert and oriented to person, place, and time.  Psychiatric:        Mood and Affect: Mood normal.        Behavior: Behavior normal.      Assessment: *Duodenal ulcer *Acute upper GI bleeding status post coil embolization of pseudoaneurysm *Iron deficiency anemia  Plan: Discussed case in depth with patient and sister who is with him today,  Ulcer biospies negative for malignancy, CA 19-9 elevated at 81 on 4/3, CEA negative.  Repeat CT pancreas 05/15/2022 much improved.  Repeat upper endoscopy 06/22/2022 with near complete healing of previously noted ulcer.  Continue on pantoprazole 40 mg daily.  Avoid NSAIDs.  Avoid alcohol.  Work on tobacco cessation  Continue iron supplementation.  Will defer to patient's PCP in this regard.  Will request latest blood work.  Follow-up in 6 months or sooner if needed.  11/11/2022 11:40 AM   Disclaimer: This note was dictated with voice recognition software. Similar sounding words can inadvertently be transcribed and may not be corrected upon review.

## 2023-04-06 IMAGING — CT CT ABDOMEN WO/W CM
2 of 14 series · 9 of 46 positions shown, 15 images · IV contrast (agent unspecified)
Comparison: CT abdomen and pelvis dated March 15, 2022

CLINICAL DATA: Duodenal ulcer hemorrhage

EXAM:
CT ABDOMEN WITHOUT AND WITH CONTRAST
TECHNIQUE: Multidetector CT imaging of the abdomen was performed following the
standard protocol before and following the bolus administration of
intravenous contrast.

[Series 7: coronal arterial · coronal · arterial · 0.41mm/px · 3 of 91 slices shown, 4 images]
[im 23/91  soft-tissue]
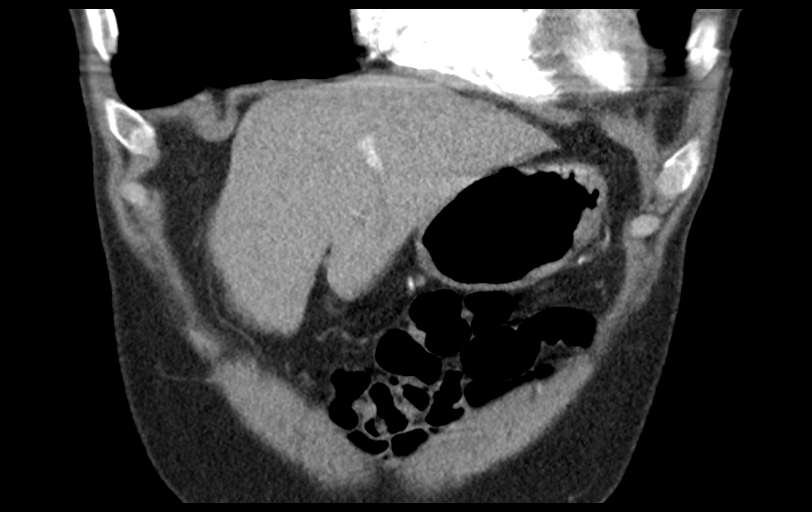
[im 46/91  soft-tissue]
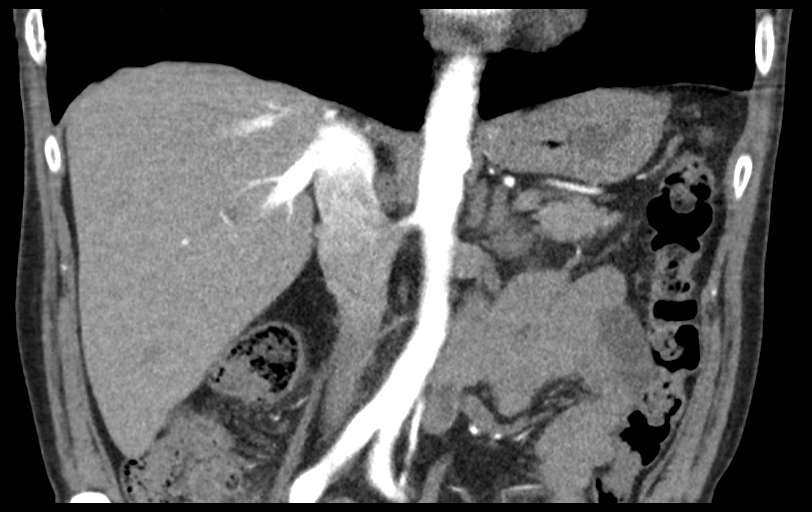
[im 46/91  bone]
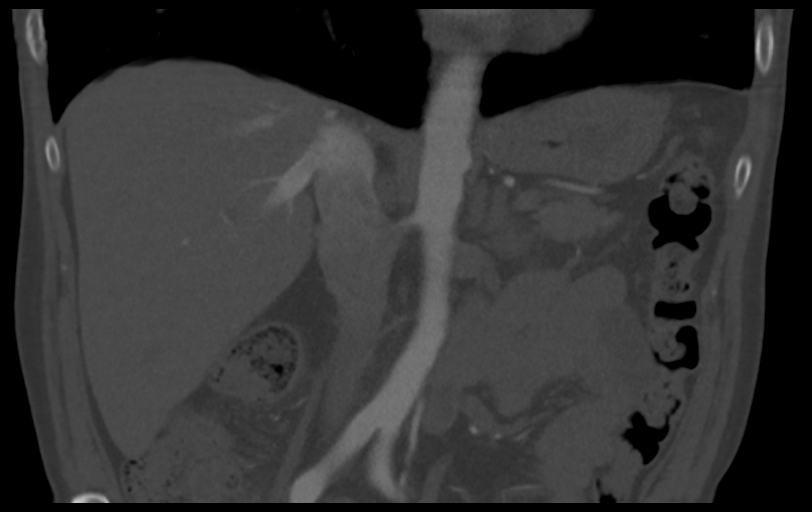
[im 68/91  soft-tissue]
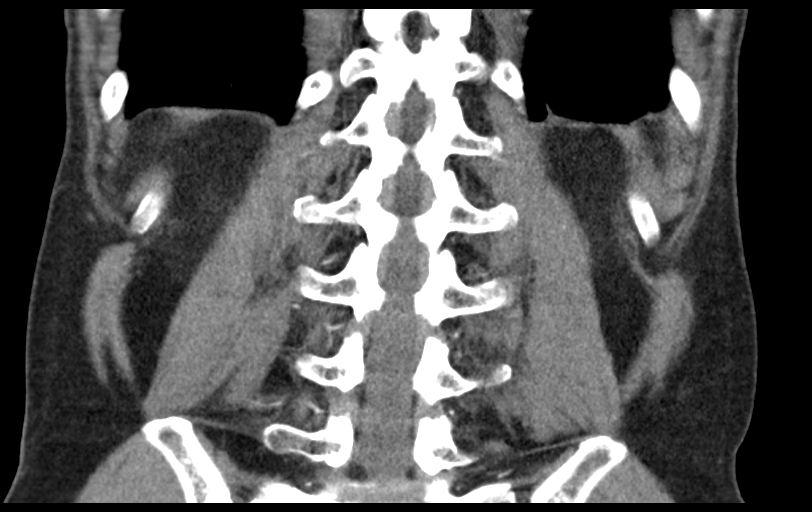

[Series 10: 2 venous thins · axial · portal-venous · 0.64mm/px · z∈[-494,-349]mm · 6 of 203 slices shown, 11 images]
[im 29/203  soft-tissue]
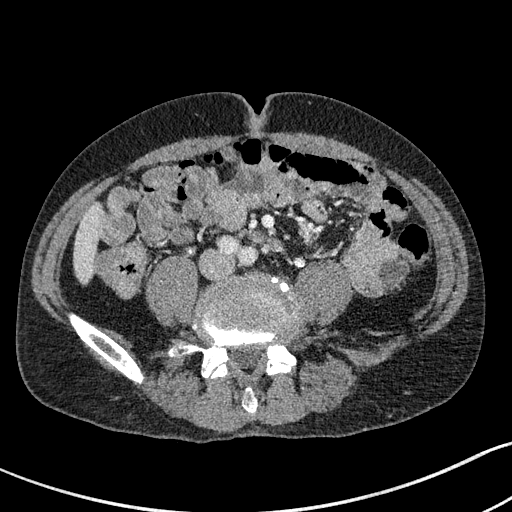
[im 29/203  bone]
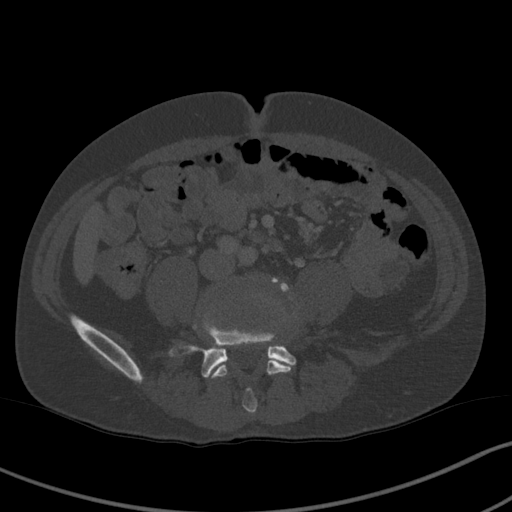
[im 58/203  soft-tissue]
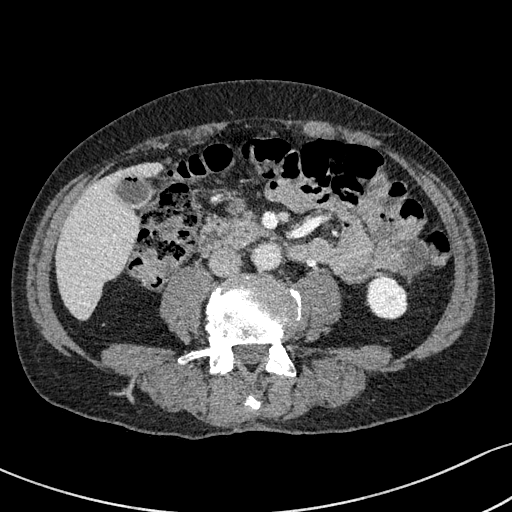
[im 87/203  soft-tissue]
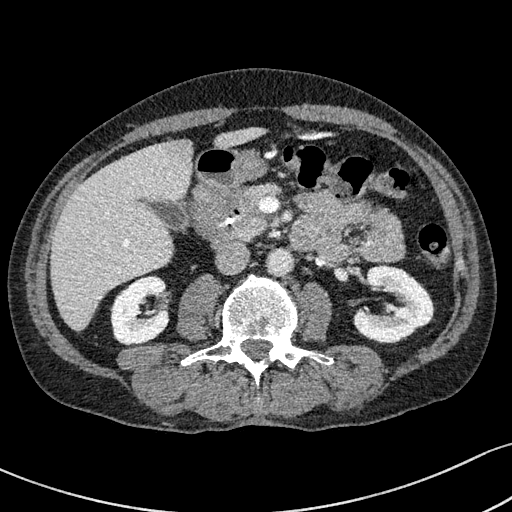
[im 87/203  lung]
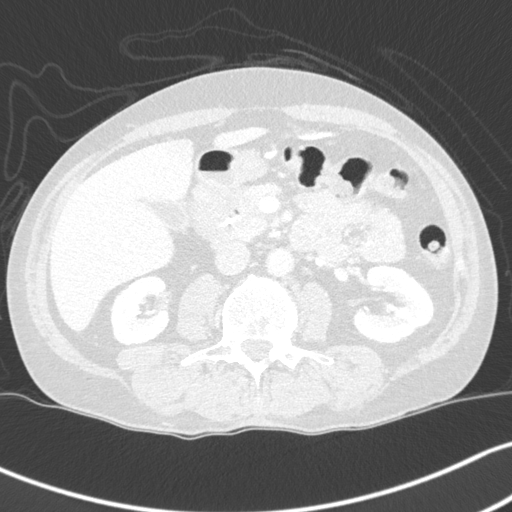
[im 116/203  soft-tissue]
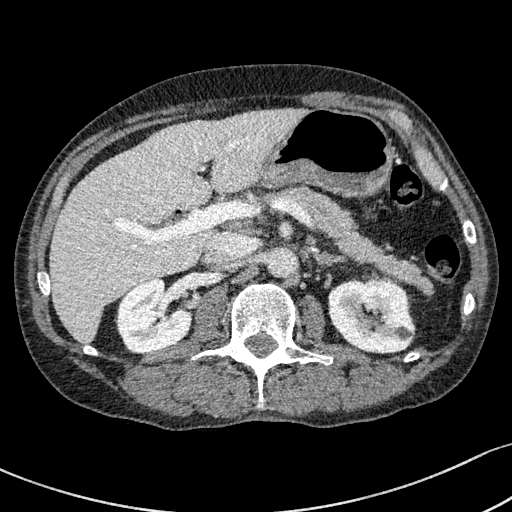
[im 116/203  lung]
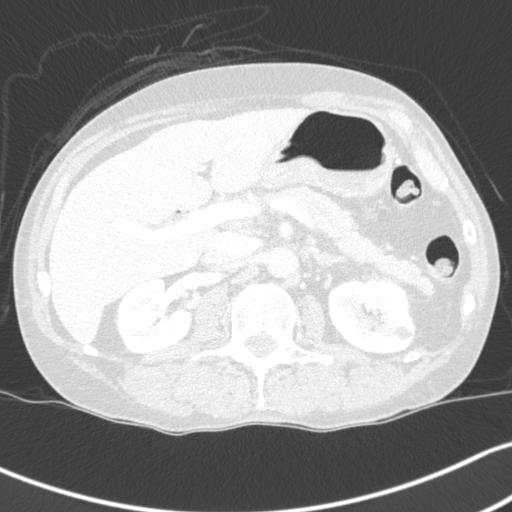
[im 145/203  soft-tissue]
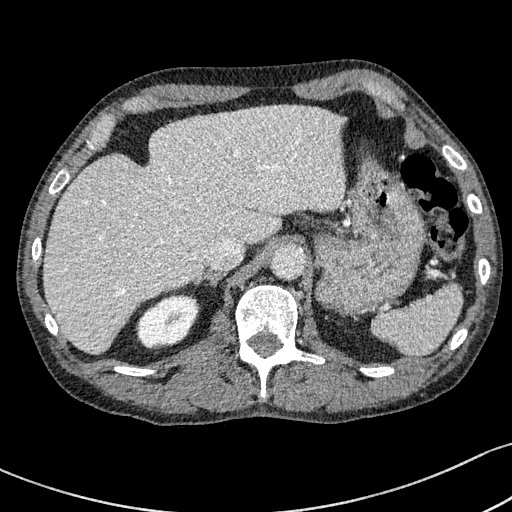
[im 145/203  lung]
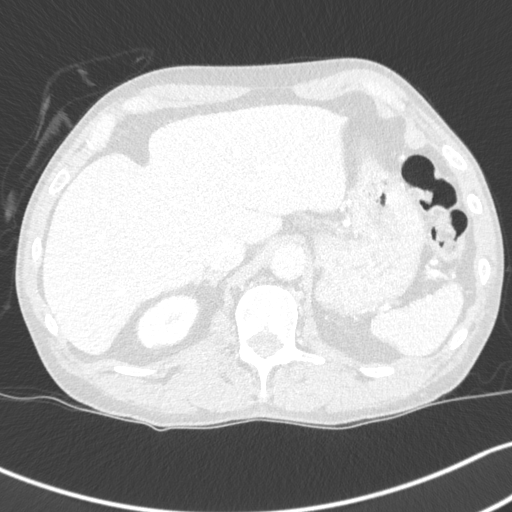
[im 174/203  soft-tissue]
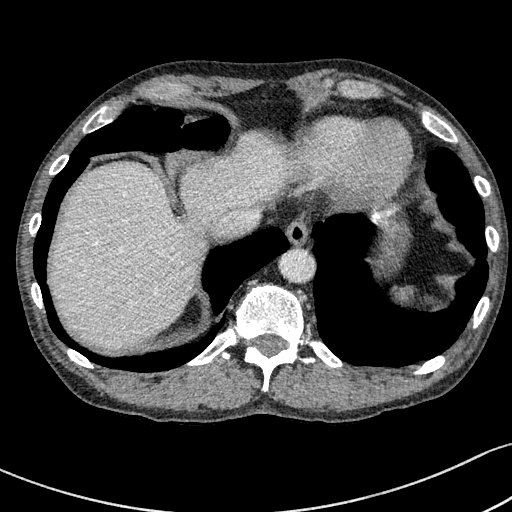
[im 174/203  lung]
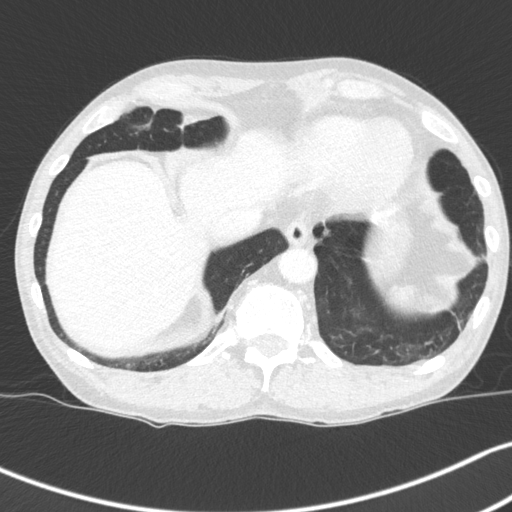

[9 of 46 positions shown; findings below may reference images not displayed]

RADIATION DOSE REDUCTION: This exam was performed according to the
departmental dose-optimization program which includes automated
exposure control, adjustment of the mA and/or kV according to
patient size and/or use of iterative reconstruction technique.

CONTRAST:  100mL OMNIPAQUE IOHEXOL 300 MG/ML  SOLN
FINDINGS: Lower chest: No acute abnormality.

Hepatobiliary: No focal liver abnormality is seen. No gallstones,
gallbladder wall thickening, or biliary dilatation. Pneumobilia and
small amount of air seen in the gallbladder.

Pancreas: No pancreatic ductal dilatation or surrounding
inflammatory changes.

Spleen: Normal in size without focal abnormality.

Adrenals/Urinary Tract: Bilateral adrenal glands are unremarkable.
Kidneys enhance symmetrically with no evidence of hydronephrosis or
nephrolithiasis. Bilateral low-attenuation renal lesions, largest
are compatible with simple cysts, unchanged when compared with
prior.

Stomach/Bowel: Normal appearance of the stomach. Wall thickening of
the medial proximal portion of the duodenum with small focal
outpouching, decreased wall thickening and size of outpouching when
compared with prior exam. Visualized small and large bowel are
unremarkable.

Vascular/Lymphatic: New embolization coils noted at the area of the
proximal duodenum with no evidence of residual pseudoaneurysm. No
significant atherosclerotic disease.

Other: No abdominal wall hernia or abnormality.

Musculoskeletal: No acute or significant osseous findings.
IMPRESSION: Decreased wall thickening of the medial proximal portion of the
duodenum and decreased size of small focal outpouching. Associated
embolization coils with no evidence of residual pseudoaneurysm.

## 2023-05-11 NOTE — Progress Notes (Unsigned)
Referring Provider: Fanny Dance, FNP Primary Care Physician:  Fanny Dance, FNP Primary GI Physician: Dr. Marletta Lor   No chief complaint on file.   HPI:   Wesley Francis is a 57 y.o. male presenting today for follow-up.   atient discharged from [facility] on 03/13/22 after a 4-day stay for GI bleeding, CT a concerning for duodenal/pancreatic head inflammation versus malignancy versus ruptured duodenal ulcer/diverticulum.  Underwent EGD 03/10/2022  which showed congested mucosa of the pylorus, biopsies with reactive gastropathy.  1 large deeply cratered ulcer in the duodenum where the ampulla should be located, approximately 30 mm in largest dimension, circumferential, some food debris noted.  EGD notes rounded circumferential mass present at the base of the ulcer partially covered with fibrin, questionable adherent clot.  CA 19-9 elevated at 81 on 4/3, CEA negative. ***   Case was discussed with advanced endoscopy in [facility] who recommended repeat CT scan in 6 weeks as well as repeat EGD in 8 to 10 weeks pending clinical course.   Patient was discharged 03/13/22 after completing 72 hours of PPI infusion.  Hemoglobin was stable at 7.6.  Abdominal pain had resolved.  Was tolerating diet.   Patient presented back to our ER 03/14/22 with melena.  Hemoglobin was 7.5.  He did receive 1 unit PRBCs.  After completing transfusion he reportedly became hypotensive and had a syncopal episode with multiple bloody bowel movements.   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 He was transferred to [facility] and underwent angiogram with coil embolization of pseudoaneurysm at the ulcer region on CTA by interventional radiology.  Discharged 4 days later with stable hemoglobin.   Underwent repeat CT scan of his pancreas 05/15/2022 showed decreased wall thickening of the duodenum as well as decreased size of focal outpouching, associated embolization  coils with no evidence of residual pseudoaneurysm.  Pancreas appears normal.  No pancreatic ductal dilatation or surrounding phonatory changes.   Repeat EGD 06/22/2022 showed almost complete healing of previously noted large duodenal ulcers.  He was maintained on pantoprazole 40 mg daily.  Last seen in the office 11/11/2022 reporting he was doing great.  He continued on iron supplementation.  No overt GI bleeding.  No abdominal pain.  Noted weight gain.  Reported hemoglobin was 13 last week.  Continue to drink alcohol and smoke cigarettes.  Recommended continuing pantoprazole 40 mg daily and iron supplementation, avoid NSAIDs, alcohol, and work towards tobacco cessation, 26-month follow-up.  Reviewed recent labs and [facility] completed 01/14/2023.  Hemoglobin within normal limits at 14.7.  Today:   Past Medical History:  Diagnosis Date   AKI (acute kidney injury) (HCC) 04/04/2016   Anemia    Arthritis    Asthma    ETOH abuse    Folate deficiency 04/05/2016   Heme positive stool 04/05/2016   Hypertension     Past Surgical History:  Procedure Laterality Date   BIOPSY  03/10/2022   Procedure: BIOPSY;  Surgeon: Dolores Frame, MD;  [facility];  Service: Gastroenterology;;   COLONOSCOPY     COLONOSCOPY WITH PROPOFOL N/A 03/02/2017   Procedure: COLONOSCOPY WITH PROPOFOL;  Surgeon: Christena Deem, MD;  Location: [facility] ENDOSCOPY;  Service: Endoscopy;  Laterality: N/A;   COLONOSCOPY WITH PROPOFOL N/A 07/08/2020   Procedure: COLONOSCOPY WITH PROPOFOL;  Surgeon: Regis Bill, MD;  Location: [facility] ENDOSCOPY;  Service: Endoscopy;  Laterality: N/A;   ESOPHAGOGASTRODUODENOSCOPY (EGD) WITH PROPOFOL  N/A 07/08/2020   Procedure: ESOPHAGOGASTRODUODENOSCOPY (EGD) WITH PROPOFOL;  Surgeon: Regis Bill, MD;  Location: [facility] ENDOSCOPY;  Service: Endoscopy;  Laterality: N/A;   ESOPHAGOGASTRODUODENOSCOPY (EGD) WITH PROPOFOL N/A 03/10/2022   Procedure: ESOPHAGOGASTRODUODENOSCOPY (EGD)  WITH PROPOFOL;  Surgeon: Dolores Frame, MD;  Location: [facility];  Service: Gastroenterology;  Laterality: N/A;   ESOPHAGOGASTRODUODENOSCOPY (EGD) WITH PROPOFOL N/A 06/22/2022   Procedure: ESOPHAGOGASTRODUODENOSCOPY (EGD) WITH PROPOFOL;  Surgeon: Lanelle Bal, DO;  Location: [facility];  Service: Endoscopy;  Laterality: N/A;  11:45am   FRACTURE SURGERY     IR ANGIOGRAM FOLLOW UP STUDY  03/15/2022   IR ANGIOGRAM SELECTIVE EACH ADDITIONAL VESSEL  03/15/2022   IR ANGIOGRAM VISCERAL SELECTIVE  03/15/2022   IR ANGIOGRAM VISCERAL SELECTIVE  03/15/2022   IR EMBO ART  VEN HEMORR LYMPH EXTRAV  INC GUIDE ROADMAPPING  03/15/2022   IR US GUIDE VASC ACCESS RIGHT  03/15/2022   KNEE ARTHROCENTESIS      Current Outpatient Medications  Medication Sig Dispense Refill   acetaminophen (TYLENOL) 325 MG tablet Take 650 mg by mouth every 6 (six) hours as needed for mild pain.     albuterol (PROVENTIL HFA;VENTOLIN HFA) 108 (90 Base) MCG/ACT inhaler Inhale 2 puffs into the lungs every 6 (six) hours as needed for wheezing or shortness of breath. 1 Inhaler 2   amLODipine (NORVASC) 5 MG tablet Take 1 tablet (5 mg total) by mouth daily. (Patient not taking: Reported on 06/10/2022) 30 tablet 0   Cholecalciferol (VITAMIN D) 2000 units CAPS Take 2,000 Units by mouth every other day.     ferrous sulfate 325 (65 FE) MG EC tablet Take 325 mg by mouth 2 (two) times daily.     folic acid (FOLVITE) 1 MG tablet Take 1 tablet (1 mg total) by mouth daily. 30 tablet 0   pantoprazole (PROTONIX) 40 MG tablet Take 40 mg by mouth 2 (two) times daily.     sucralfate (CARAFATE) 1 GM/10ML suspension Take 10 mLs (1 g total) by mouth 4 (four) times daily -  with meals and at bedtime. (Patient not taking: Reported on 11/11/2022) 420 mL 0   thiamine 100 MG tablet Take 1 tablet (100 mg total) by mouth daily. 30 tablet 0   No current facility-administered medications for this visit.    Allergies as of 05/13/2023   (No Known  Allergies)    Family History  Problem Relation Age of Onset   Hypertension Mother    Stroke Mother    Hypertension Father     Social History   Socioeconomic History   Marital status: Single    Spouse name: Not on file   Number of children: Not on file   Years of education: Not on file   Highest education level: Not on file  Occupational History   Not on file  Tobacco Use   Smoking status: Every Day    Packs/day: .5    Types: Cigarettes    Passive exposure: Current   Smokeless tobacco: Never  Vaping Use   Vaping Use: Never used  Substance and Sexual Activity   Alcohol use: Yes    Alcohol/week: 4.0 standard drinks of alcohol    Types: 4 Cans of beer per week   Drug use: No   Sexual activity: Not on file  Other Topics Concern   Not on file  Social History Narrative   Not on file   Social Determinants of Health   Financial Resource Strain: Not on file  Food Insecurity: Not on file  Transportation Needs: Not on file  Physical Activity: Not on file  Stress: Not on file  Social Connections: Not on file    Review of Systems: Gen: Denies fever, chills, anorexia. Denies fatigue, weakness, weight loss.  CV: Denies chest pain, palpitations, syncope, peripheral edema, and claudication. Resp: Denies dyspnea at rest, cough, wheezing, coughing up blood, and pleurisy. GI: Denies vomiting blood, jaundice, and fecal incontinence.   Denies dysphagia or odynophagia. Derm: Denies rash, itching, dry skin Psych: Denies depression, anxiety, memory loss, confusion. No homicidal or suicidal ideation.  Heme: Denies bruising, bleeding, and enlarged lymph nodes.  Physical Exam: There were no vitals taken for this visit. General:   Alert and oriented. No distress noted. Pleasant and cooperative.  Head:  Normocephalic and atraumatic. Eyes:  Conjuctiva clear without scleral icterus. Heart:  S1, S2 present without murmurs appreciated. Lungs:  Clear to auscultation bilaterally. No  wheezes, rales, or rhonchi. No distress.  Abdomen:  +BS, soft, non-tender and non-distended. No rebound or guarding. No HSM or masses noted. Msk:  Symmetrical without gross deformities. Normal posture. Extremities:  Without edema. Neurologic:  Alert and  oriented x4 Psych:  Normal mood and affect.    Assessment:     Plan:  ***   Ermalinda Memos, PA-C [facility] Gastroenterology 05/13/2023

## 2023-05-13 ENCOUNTER — Encounter: Payer: Self-pay | Admitting: Gastroenterology

## 2023-05-13 ENCOUNTER — Ambulatory Visit (INDEPENDENT_AMBULATORY_CARE_PROVIDER_SITE_OTHER): Payer: Medicaid Other | Admitting: Gastroenterology

## 2023-05-13 VITALS — BP 123/85 | HR 65 | Temp 97.7°F | Ht 66.0 in | Wt 161.2 lb

## 2023-05-13 DIAGNOSIS — Z1211 Encounter for screening for malignant neoplasm of colon: Secondary | ICD-10-CM

## 2023-05-13 DIAGNOSIS — Z8719 Personal history of other diseases of the digestive system: Secondary | ICD-10-CM | POA: Diagnosis not present

## 2023-05-13 DIAGNOSIS — Z800 Family history of malignant neoplasm of digestive organs: Secondary | ICD-10-CM | POA: Diagnosis not present

## 2023-05-13 DIAGNOSIS — D509 Iron deficiency anemia, unspecified: Secondary | ICD-10-CM | POA: Diagnosis not present

## 2023-05-13 NOTE — Patient Instructions (Signed)
Please have blood work completed at [facility].  Continue taking pantoprazole 40 mg twice daily for now.  Continue taking iron daily for now.  Work towards alcohol cessation.  Work towards smoking cessation.  I will discuss with Dr. Marletta Lor when he recommends her next colonoscopy.  We will follow-up with you in 6 months or sooner if needed.  It was good to meet you today!  Ermalinda Memos, PA-C [facility] Gastroenterology

## 2023-05-17 ENCOUNTER — Telehealth: Payer: Self-pay | Admitting: *Deleted

## 2023-05-17 ENCOUNTER — Telehealth: Payer: Self-pay | Admitting: Gastroenterology

## 2023-05-17 NOTE — Telephone Encounter (Signed)
Noted  

## 2023-05-17 NOTE — Telephone Encounter (Signed)
Patient's wife called and left a message about blood work he had done.  [phone]

## 2023-05-17 NOTE — Telephone Encounter (Signed)
Spoke to pt's sister and faxed lab requisitions to [facility] Department.

## 2023-05-17 NOTE — Telephone Encounter (Signed)
Pt's sister called and would like labs done at [facility] Department. Faxed lab requisitions .

## 2023-05-24 NOTE — Telephone Encounter (Signed)
Can we find out if labs have been completed? If so, can we request results?

## 2023-05-26 NOTE — Telephone Encounter (Signed)
Results placed on your desk

## 2023-05-30 NOTE — Telephone Encounter (Signed)
Reviewed labs dated 05/19/23  CBC: WBC 7.0, hemoglobin 14.2, hematocrit 45.1, MCV 78 (L), MCH 24.6 (L), MCHC 31.5, platelets 247.  CMP: Glucose 83, BUN 11, creatinine 1.27, sodium 135, potassium 4.6, ordered 100, calcium 9.5, total protein 7.5, albumin 4.6, total bilirubin 0.5, alkaline phosphatase 173 (H), AST 30, ALT 27  Iron/TIBC: Iron 78, iron saturation 26%, TIBC 305, UIBC 227   Courtney: Please let patient know that I received and reviewed his labs.  His iron and iron saturation are normal, but ferritin was not checked.  Hemoglobin is within normal limits, but his red blood cells are small.  Also, his alkaline phosphatase is elevated.  This was previously normal 1 year ago.  Recommend checking ferritin to check his iron stores and alkaline phosphatase isoenzymes to help determine why his alkaline phosphatase is elevated.  If alkaline phosphatase is elevated due to her liver source, we may need to pursue additional workup.  Additionally, please let him know I discussed timing of next colonoscopy with Dr. Marletta Lor.  Dr. Marletta Lor recommended updating colonoscopy at this time.  If he is agreeable, we can go ahead and get this arranged.

## 2023-05-31 ENCOUNTER — Telehealth (INDEPENDENT_AMBULATORY_CARE_PROVIDER_SITE_OTHER): Payer: Self-pay

## 2023-05-31 ENCOUNTER — Other Ambulatory Visit (INDEPENDENT_AMBULATORY_CARE_PROVIDER_SITE_OTHER): Payer: Self-pay

## 2023-05-31 DIAGNOSIS — D509 Iron deficiency anemia, unspecified: Secondary | ICD-10-CM

## 2023-05-31 DIAGNOSIS — K7010 Alcoholic hepatitis without ascites: Secondary | ICD-10-CM

## 2023-05-31 DIAGNOSIS — R55 Syncope and collapse: Secondary | ICD-10-CM

## 2023-05-31 DIAGNOSIS — F1010 Alcohol abuse, uncomplicated: Secondary | ICD-10-CM

## 2023-05-31 DIAGNOSIS — K264 Chronic or unspecified duodenal ulcer with hemorrhage: Secondary | ICD-10-CM

## 2023-05-31 NOTE — Telephone Encounter (Signed)
Faxed the orders to [facility], Asked that they please send the results to [place] at [phone]

## 2023-05-31 NOTE — Telephone Encounter (Signed)
I spoke with the patient sister made her aware of all. She wants the patient ot have labs drawn at [facility]. We Will need to call them to get the fax # at Phone number ( [phone] Virgina Jock, NP.           Reviewed labs dated 05/19/23   CBC: WBC 7.0, hemoglobin 14.2, hematocrit 45.1, MCV 78 (L), MCH 24.6 (L), MCHC 31.5, platelets 247.   CMP: Glucose 83, BUN 11, creatinine 1.27, sodium 135, potassium 4.6, ordered 100, calcium 9.5, total protein 7.5, albumin 4.6, total bilirubin 0.5, alkaline phosphatase 173 (H), AST 30, ALT 27   Iron/TIBC: Iron 78, iron saturation 26%, TIBC 305, UIBC 227     Courtney: Please let patient know that I received and reviewed his labs.  His iron and iron saturation are normal, but ferritin was not checked.  Hemoglobin is within normal limits, but his red blood cells are small.  Also, his alkaline phosphatase is elevated.  This was previously normal 1 year ago.   Recommend checking ferritin to check his iron stores and alkaline phosphatase isoenzymes to help determine why his alkaline phosphatase is elevated.  If alkaline phosphatase is elevated due to her liver source, we may need to pursue additional workup.   Additionally, please let him know I discussed timing of next colonoscopy with Dr. Marletta Lor.  Dr. Marletta Lor recommended updating colonoscopy at this time.  If he is agreeable, we can go ahead and get this arranged.        Electronically signed by Letta Median, PA-C at 05/30/2023  4:58 PM

## 2023-05-31 NOTE — Telephone Encounter (Signed)
I spoke with the [facility] dept and fax # is [phone].

## 2023-06-02 NOTE — Telephone Encounter (Signed)
I called [facility] department and they say the patient sister had called there this am and  was transferred to the Np. They do not know when patient is scheduled to go have labs drawn there.

## 2023-06-02 NOTE — Telephone Encounter (Signed)
Spoke to pt's sister (DPR) informed her of results and recommendations. She voiced understanding. Faxed lab requistions to [facility] Dept. She states she will ask him if he wants colonoscopy and get back with [place].

## 2023-06-02 NOTE — Telephone Encounter (Signed)
Noted  

## 2023-06-07 NOTE — Telephone Encounter (Signed)
I spoke with Steward Drone at [facility] department she will fax the lab results over to [place] at [phone].

## 2023-06-08 NOTE — Telephone Encounter (Signed)
Reviewed labs scanned in under media.  Unfortunately, these are the same lab results that I already received dated 05/19/2023.  The additional labs that I requested were not received.

## 2023-06-08 NOTE — Telephone Encounter (Signed)
I do not have anything yet.

## 2023-06-08 NOTE — Telephone Encounter (Signed)
Ellwood Handler, I have been trying to track the labs down on this patient and have called the [facility] dept several times and asked that they fax them to [place]. Do you happen to know if they have came in there for your review? I am just following up from when I covered there last week. Please advise if you already have the labs. Thanks.

## 2023-06-08 NOTE — Telephone Encounter (Signed)
I have called and spoke with Rosey Bath at the [place] [facility] dept she says she will fax me the lab results.

## 2023-06-08 NOTE — Telephone Encounter (Signed)
Labs came in today and I have forwarded it to [facility] for review.

## 2023-06-14 ENCOUNTER — Other Ambulatory Visit: Payer: Self-pay | Admitting: Gastroenterology

## 2023-06-14 DIAGNOSIS — R748 Abnormal levels of other serum enzymes: Secondary | ICD-10-CM

## 2023-06-14 NOTE — Telephone Encounter (Signed)
Pt aware of [place] appt details. He voiced understanding and had no questions

## 2023-06-14 NOTE — Telephone Encounter (Signed)
Reviewed labs under [facility] dated 06/02/2023.  Ferritin is elevated at 767.  Okay to go ahead and stop iron.  Will recheck his labs at his follow-up in 6 months.  Alkaline phosphatase has increased further and is now 358.  Isoenzymes shows that 87% of the alkaline phosphatase is coming from his liver.  Recommend further evaluation with checking AMA, ANA, ASMA, immunoglobulins, and RUQ ultrasound.   I will place lab orders for [facility] and RUQ ultrasound orders.  Tammy/Mindy:  RUQ ultrasound will need to be arranged.

## 2023-06-14 NOTE — Telephone Encounter (Signed)
Pt's sister (DPR) called informed her of results and recommendations. She voiced understanding. Informed her of [place] date and time.

## 2023-06-22 ENCOUNTER — Telehealth: Payer: Self-pay | Admitting: *Deleted

## 2023-06-22 NOTE — Telephone Encounter (Signed)
Pt's sister Elease Hashimoto (on [place]) left message regarding pt's iron.   LMTRC

## 2023-06-22 NOTE — Telephone Encounter (Signed)
Pt's sister returned call and wanted to know if he needed to stop taking his iron medication for the ultrasound  Advised her that pt does not have to stop his iron. Verbalized understanding.

## 2023-06-27 ENCOUNTER — Other Ambulatory Visit: Payer: Self-pay

## 2023-06-27 ENCOUNTER — Emergency Department (HOSPITAL_COMMUNITY): Payer: MEDICAID

## 2023-06-27 ENCOUNTER — Encounter (HOSPITAL_COMMUNITY): Payer: Self-pay

## 2023-06-27 ENCOUNTER — Inpatient Hospital Stay (HOSPITAL_COMMUNITY)
Admission: EM | Admit: 2023-06-27 | Discharge: 2023-06-28 | DRG: 445 | Disposition: A | Discharge status: Other Acute Inpatient Hospital | Payer: MEDICAID | Attending: Family Medicine | Admitting: Family Medicine

## 2023-06-27 DIAGNOSIS — K8309 Other cholangitis: Secondary | ICD-10-CM

## 2023-06-27 DIAGNOSIS — E538 Deficiency of other specified B group vitamins: Secondary | ICD-10-CM | POA: Diagnosis present

## 2023-06-27 DIAGNOSIS — E871 Hypo-osmolality and hyponatremia: Secondary | ICD-10-CM

## 2023-06-27 DIAGNOSIS — Z8249 Family history of ischemic heart disease and other diseases of the circulatory system: Secondary | ICD-10-CM

## 2023-06-27 DIAGNOSIS — J449 Chronic obstructive pulmonary disease, unspecified: Secondary | ICD-10-CM | POA: Insufficient documentation

## 2023-06-27 DIAGNOSIS — Z79899 Other long term (current) drug therapy: Secondary | ICD-10-CM

## 2023-06-27 DIAGNOSIS — Z720 Tobacco use: Secondary | ICD-10-CM | POA: Diagnosis present

## 2023-06-27 DIAGNOSIS — J4489 Other specified chronic obstructive pulmonary disease: Secondary | ICD-10-CM | POA: Diagnosis present

## 2023-06-27 DIAGNOSIS — R7989 Other specified abnormal findings of blood chemistry: Secondary | ICD-10-CM | POA: Diagnosis present

## 2023-06-27 DIAGNOSIS — K8050 Calculus of bile duct without cholangitis or cholecystitis without obstruction: Secondary | ICD-10-CM

## 2023-06-27 DIAGNOSIS — I10 Essential (primary) hypertension: Secondary | ICD-10-CM | POA: Diagnosis present

## 2023-06-27 DIAGNOSIS — F1010 Alcohol abuse, uncomplicated: Secondary | ICD-10-CM | POA: Diagnosis present

## 2023-06-27 DIAGNOSIS — R7401 Elevation of levels of liver transaminase levels: Secondary | ICD-10-CM | POA: Diagnosis present

## 2023-06-27 DIAGNOSIS — R1011 Right upper quadrant pain: Principal | ICD-10-CM

## 2023-06-27 DIAGNOSIS — K831 Obstruction of bile duct: Secondary | ICD-10-CM

## 2023-06-27 DIAGNOSIS — R1013 Epigastric pain: Secondary | ICD-10-CM

## 2023-06-27 DIAGNOSIS — K828 Other specified diseases of gallbladder: Secondary | ICD-10-CM | POA: Diagnosis present

## 2023-06-27 DIAGNOSIS — D509 Iron deficiency anemia, unspecified: Secondary | ICD-10-CM | POA: Diagnosis present

## 2023-06-27 DIAGNOSIS — Z8719 Personal history of other diseases of the digestive system: Secondary | ICD-10-CM

## 2023-06-27 DIAGNOSIS — E876 Hypokalemia: Secondary | ICD-10-CM | POA: Diagnosis present

## 2023-06-27 DIAGNOSIS — K219 Gastro-esophageal reflux disease without esophagitis: Secondary | ICD-10-CM | POA: Insufficient documentation

## 2023-06-27 DIAGNOSIS — F17210 Nicotine dependence, cigarettes, uncomplicated: Secondary | ICD-10-CM | POA: Diagnosis present

## 2023-06-27 DIAGNOSIS — K8031 Calculus of bile duct with cholangitis, unspecified, with obstruction: Principal | ICD-10-CM | POA: Diagnosis present

## 2023-06-27 DIAGNOSIS — R059 Cough, unspecified: Secondary | ICD-10-CM | POA: Diagnosis present

## 2023-06-27 DIAGNOSIS — M1990 Unspecified osteoarthritis, unspecified site: Secondary | ICD-10-CM | POA: Diagnosis present

## 2023-06-27 DIAGNOSIS — Z8711 Personal history of peptic ulcer disease: Secondary | ICD-10-CM

## 2023-06-27 DIAGNOSIS — K709 Alcoholic liver disease, unspecified: Secondary | ICD-10-CM | POA: Diagnosis present

## 2023-06-27 LAB — LIPASE, BLOOD: Lipase: 22 U/L (ref 11–51)

## 2023-06-27 LAB — COMPREHENSIVE METABOLIC PANEL
ALT: 162 U/L — ABNORMAL HIGH (ref 0–44)
AST: 163 U/L — ABNORMAL HIGH (ref 15–41)
Albumin: 3.7 g/dL (ref 3.5–5.0)
Alkaline Phosphatase: 262 U/L — ABNORMAL HIGH (ref 38–126)
Anion gap: 11 (ref 5–15)
BUN: 21 mg/dL — ABNORMAL HIGH (ref 6–20)
CO2: 15 mmol/L — ABNORMAL LOW (ref 22–32)
Calcium: 9.3 mg/dL (ref 8.9–10.3)
Chloride: 104 mmol/L (ref 98–111)
Creatinine, Ser: 1.26 mg/dL — ABNORMAL HIGH (ref 0.61–1.24)
GFR, Estimated: >60 mL/min (ref >60)
Glucose, Bld: 135 mg/dL — ABNORMAL HIGH (ref 70–99)
Potassium: 2.8 mmol/L — ABNORMAL LOW (ref 3.5–5.1)
Sodium: 130 mmol/L — ABNORMAL LOW (ref 135–145)
Total Bilirubin: 7.5 mg/dL — ABNORMAL HIGH (ref 0.3–1.2)
Total Protein: 7.9 g/dL (ref 6.5–8.1)

## 2023-06-27 LAB — CBC
HCT: 36.3 % — ABNORMAL LOW (ref 39.0–52.0)
Hemoglobin: 12.5 g/dL — ABNORMAL LOW (ref 13.0–17.0)
MCH: 24.6 pg — ABNORMAL LOW (ref 26.0–34.0)
MCHC: 34.4 g/dL (ref 30.0–36.0)
MCV: 71.3 fL — ABNORMAL LOW (ref 80.0–100.0)
Platelets: 292 10*3/uL (ref 150–400)
RBC: 5.09 MIL/uL (ref 4.22–5.81)
RDW: 18 % — ABNORMAL HIGH (ref 11.5–15.5)
WBC: 22.6 10*3/uL — ABNORMAL HIGH (ref 4.0–10.5)
nRBC: 0 % (ref 0.0–0.2)

## 2023-06-27 MED ORDER — POTASSIUM CHLORIDE 10 MEQ/100ML IV SOLN
10.0000 meq | INTRAVENOUS | Status: AC
  Administered 2023-06-27 – 2023-06-28 (×3): 10 meq via INTRAVENOUS
  Filled 2023-06-27 (×3): qty 100

## 2023-06-27 MED ORDER — ONDANSETRON HCL 4 MG/2ML IJ SOLN
4.0000 mg | Freq: Once | INTRAMUSCULAR | Status: AC
  Administered 2023-06-27: 4 mg via INTRAVENOUS
  Filled 2023-06-27: qty 2

## 2023-06-27 MED ORDER — MORPHINE SULFATE (PF) 4 MG/ML IV SOLN
4.0000 mg | Freq: Once | INTRAVENOUS | Status: DC
  Filled 2023-06-27: qty 1

## 2023-06-27 MED ORDER — PANTOPRAZOLE SODIUM 40 MG IV SOLR
40.0000 mg | Freq: Once | INTRAVENOUS | Status: AC
  Administered 2023-06-27: 40 mg via INTRAVENOUS
  Filled 2023-06-27: qty 10

## 2023-06-27 MED ORDER — IOHEXOL 300 MG/ML  SOLN
100.0000 mL | Freq: Once | INTRAMUSCULAR | Status: AC | PRN
  Administered 2023-06-27: 100 mL via INTRAVENOUS

## 2023-06-27 NOTE — ED Provider Notes (Signed)
Care assumed from [facility], patient with abdominal pain, elevated LFT's, leukocytosis. CT abdomen and pelvis is pending, will need to be admitted.  CT scan shows biliary ductal dilatation without discrete obstructing lesion, distended gallbladder with suggestion of wall thickening.  I have independently viewed the images, and agree with the radiologist's interpretation.  At this point, he seems likely to have cholecystitis with choledocholithiasis.  I have ordered initial dose of ceftriaxone and metronidazole.  I have discussed case with Dr. Carren Rang [facility], who agrees to admit the patient.  Results for orders placed or performed during the hospital encounter of 06/27/23  Lipase, blood  Result Value Ref Range   Lipase 22 11 - 51 U/L  Comprehensive metabolic panel  Result Value Ref Range   Sodium 130 (L) 135 - 145 mmol/L   Potassium 2.8 (L) 3.5 - 5.1 mmol/L   Chloride 104 98 - 111 mmol/L   CO2 15 (L) 22 - 32 mmol/L   Glucose, Bld 135 (H) 70 - 99 mg/dL   BUN 21 (H) 6 - 20 mg/dL   Creatinine, Ser 5.62 (H) 0.61 - 1.24 mg/dL   Calcium 9.3 8.9 - 13.0 mg/dL   Total Protein 7.9 6.5 - 8.1 g/dL   Albumin 3.7 3.5 - 5.0 g/dL   AST 865 (H) 15 - 41 U/L   ALT 162 (H) 0 - 44 U/L   Alkaline Phosphatase 262 (H) 38 - 126 U/L   Total Bilirubin 7.5 (H) 0.3 - 1.2 mg/dL   GFR, Estimated >78 >46 mL/min   Anion gap 11 5 - 15  CBC  Result Value Ref Range   WBC 22.6 (H) 4.0 - 10.5 K/uL   RBC 5.09 4.22 - 5.81 MIL/uL   Hemoglobin 12.5 (L) 13.0 - 17.0 g/dL   HCT 96.2 (L) 95.2 - 84.1 %   MCV 71.3 (L) 80.0 - 100.0 fL   MCH 24.6 (L) 26.0 - 34.0 pg   MCHC 34.4 30.0 - 36.0 g/dL   RDW 32.4 (H) 40.1 - 02.7 %   Platelets 292 150 - 400 K/uL   nRBC 0.0 0.0 - 0.2 %   CT ABDOMEN PELVIS W CONTRAST  Result Date: 06/27/2023 CLINICAL DATA:  Acute abdominal pain EXAM: CT ABDOMEN AND PELVIS WITH CONTRAST TECHNIQUE: Multidetector CT imaging of the abdomen and pelvis was performed using the standard  protocol following bolus administration of intravenous contrast. RADIATION DOSE REDUCTION: This exam was performed according to the departmental dose-optimization program which includes automated exposure control, adjustment of the mA and/or kV according to patient size and/or use of iterative reconstruction technique. CONTRAST:  OMNIPAQUE IOHEXOL 300 MG/ML  SOLN COMPARISON:  05/15/2022 FINDINGS: Lower chest: Mild basilar atelectatic changes bilaterally. Hepatobiliary: Gallbladder is well distended. Mild suggestion of wall thickness and pericholecystic inflammatory change is noted. The common bile duct is significantly distended without definitive obstructing lesion. Intrahepatic biliary ductal dilatation is noted. Pancreas: Pancreas is within normal limits. Spleen: Normal in size without focal abnormality. Adrenals/Urinary Tract: Adrenal glands are within normal limits. Kidneys demonstrate a normal enhancement pattern bilaterally. No renal calculi or obstructive changes are seen. Renal cystic changes noted bilaterally. No follow-up is recommended. No obstructive changes are seen. The bladder is well distended. Stomach/Bowel: No obstructive or inflammatory changes of the colon are seen. The appendix is well visualized and within normal limits. Small bowel and stomach are unremarkable. No duodenal wall thickening is noted. Embolization coils are noted near the head of the pancreas and second portion of  the duodenum consistent with prior pseudoaneurysm embolization. Vascular/Lymphatic: No significant vascular findings are present. No enlarged abdominal or pelvic lymph nodes. Reproductive: Prostate is unremarkable. Other: No abdominal wall hernia or abnormality. No abdominopelvic ascites. Musculoskeletal: No acute or significant osseous findings. IMPRESSION: New biliary ductal dilatation both intrahepatic and extrahepatic without discrete obstructing lesion. Correlate with laboratory values. Distended gallbladder  with some suggestion of wall thickening. Ultrasound may be helpful for further evaluation. Changes consistent with prior pseudoaneurysm embolization. Electronically Signed   By: Alcide Clever M.D.   On: 06/27/2023 23:29      Dione Booze, MD 06/28/23 [phone]

## 2023-06-27 NOTE — ED Notes (Signed)
Patient transported to CT 

## 2023-06-27 NOTE — ED Provider Notes (Cosign Needed Addendum)
[place] EMERGENCY DEPARTMENT AT [facility] Provider Note   CSN: [id number] Arrival date & time: 06/27/23  1941     History  Chief Complaint  Patient presents with   Abdominal Pain    Wesley Francis is a 57 y.o. male with a history including hypertension, alcohol abuse with alcoholic steatohepatitis, asthma, history of duodenal ulcer April 2023, and prior CT imaging revealing for either inflammation versus malignancy of duodenal/pancreatic head presenting today with escalation of upper abdominal pain describing left and right abdominal pain which has been severe for the majority of today.  He describes episodic sharp pain in association with cramping, feels like he needs to have a bowel movement despite having a fairly normal bowel movement earlier today which did not improve or worsen his symptoms.  He denies rectal pain, he denies bloody stools, also denies fevers.  He does have nausea without emesis.  He did eat breakfast but this was before his symptoms began and has had no further p.o. intake today.  Sister at the bedside states he is actually scheduled to have an ultrasound of his liver tomorrow, ordered by Dr. Marletta Lor.  He is found no alleviators for symptoms.  He has been compliant with his medications including his pantoprazole.   The history is provided by the patient and a relative (sister at bedside).       Home Medications Prior to Admission medications   Medication Sig Start Date End Date Taking? Authorizing Provider  acetaminophen (TYLENOL) 325 MG tablet Take 650 mg by mouth every 6 (six) hours as needed for mild pain.    [provider]  albuterol (PROVENTIL HFA;VENTOLIN HFA) 108 (90 Base) MCG/ACT inhaler Inhale 2 puffs into the lungs every 6 (six) hours as needed for wheezing or shortness of breath. 04/06/16   Elliot Cousin, MD  amLODipine (NORVASC) 5 MG tablet Take 1 tablet (5 mg total) by mouth daily. 03/21/22   Burnadette Pop, MD  Cholecalciferol  (VITAMIN D) 2000 units CAPS Take 2,000 Units by mouth every other day.    [provider]  ferrous sulfate 325 (65 FE) MG EC tablet Take 325 mg by mouth 2 (two) times daily.    [provider]  folic acid (FOLVITE) 1 MG tablet Take 1 tablet (1 mg total) by mouth daily. 03/21/22   Burnadette Pop, MD  pantoprazole (PROTONIX) 40 MG tablet Take 40 mg by mouth 2 (two) times daily.    [provider]      Allergies    Patient has no known allergies.    Review of Systems   Review of Systems  Constitutional:  Positive for appetite change. Negative for chills and fever.  HENT:  Negative for congestion and sore throat.   Eyes: Negative.   Respiratory:  Negative for chest tightness and shortness of breath.   Cardiovascular:  Negative for chest pain.  Gastrointestinal:  Positive for abdominal pain and nausea. Negative for vomiting.  Genitourinary: Negative.  Negative for dysuria.  Musculoskeletal:  Negative for arthralgias, joint swelling and neck pain.  Skin: Negative.  Negative for rash and wound.  Neurological:  Negative for dizziness, weakness, light-headedness, numbness and headaches.  Psychiatric/Behavioral: Negative.      Physical Exam Updated Vital Signs BP 110/73   Pulse 96   Temp 99 F (37.2 C) (Oral)   Resp 18   Ht 5\' 8"  (1.727 m)   Wt 75.3 kg   SpO2 91%   BMI 25.24 kg/m  Physical Exam  Vitals and nursing note reviewed.  Constitutional:      Appearance: He is well-developed.  HENT:     Head: Normocephalic and atraumatic.  Eyes:     General: Scleral icterus present.     Conjunctiva/sclera: Conjunctivae normal.  Cardiovascular:     Rate and Rhythm: Normal rate and regular rhythm.     Heart sounds: Normal heart sounds.  Pulmonary:     Effort: Pulmonary effort is normal.     Breath sounds: Normal breath sounds. No wheezing.  Abdominal:     General: Bowel sounds are normal.     Palpations: Abdomen is soft.     Tenderness: There is abdominal  tenderness in the right upper quadrant, epigastric area and left upper quadrant. There is guarding. There is no rebound.     Comments: Distractible guarding right upper quadrant  Musculoskeletal:        General: Normal range of motion.     Cervical back: Normal range of motion.  Skin:    General: Skin is warm and dry.  Neurological:     Mental Status: He is alert.     ED Results / Procedures / Treatments   Labs (all labs ordered are listed, but only abnormal results are displayed) Labs Reviewed  COMPREHENSIVE METABOLIC PANEL - Abnormal; Notable for the following components:      Result Value   Sodium 130 (*)    Potassium 2.8 (*)    CO2 15 (*)    Glucose, Bld 135 (*)    BUN 21 (*)    Creatinine, Ser 1.26 (*)    AST 163 (*)    ALT 162 (*)    Alkaline Phosphatase 262 (*)    Total Bilirubin 7.5 (*)    All other components within normal limits  CBC - Abnormal; Notable for the following components:   WBC 22.6 (*)    Hemoglobin 12.5 (*)    HCT 36.3 (*)    MCV 71.3 (*)    MCH 24.6 (*)    RDW 18.0 (*)    All other components within normal limits  LIPASE, BLOOD  URINALYSIS, ROUTINE W REFLEX MICROSCOPIC    EKG None  Radiology No results found.  Procedures Procedures    Medications Ordered in ED Medications  morphine (PF) 4 MG/ML injection 4 mg (4 mg Intravenous Not Given 06/27/23 2211)  potassium chloride 10 mEq in 100 mL IVPB (has no administration in time range)  pantoprazole (PROTONIX) injection 40 mg (40 mg Intravenous Given 06/27/23 2211)  ondansetron (ZOFRAN) injection 4 mg (4 mg Intravenous Given 06/27/23 2210)  iohexol (OMNIPAQUE) 300 MG/ML solution 100 mL (100 mLs Intravenous Contrast Given 06/27/23 2304)    ED Course/ Medical Decision Making/ A&P                             Medical Decision Making Pt with history of PUD, alcoholic steatohepatitis, alcohol use disorder presenting with escalating upper abdominal pain.  Significant labs and include a high  leukocytosis with a WBC count at 22.6, he also has a potassium of 2.8, elevated transaminases with an AST 163 and ALT of 162, his bilirubin is 7.5 and has an alk phos of 262.  Pending CT abdomen at this time.   Discussed with Dr. Preston Fleeting who assumes care.  Amount and/or Complexity of Data Reviewed Labs: ordered. Radiology: ordered.  Risk Prescription drug management.  Final Clinical Impression(s) / ED Diagnoses Final diagnoses:  None    Rx / DC Orders ED Discharge Orders     None         Victoriano Lain 06/27/23 2322    Burgess Amor, PA-C 06/27/23 2322    Dione Booze, MD 06/28/23 [phone]

## 2023-06-27 NOTE — ED Triage Notes (Signed)
Upper ABD Appt to have [place] tomorrow  Pt stated that it feels like he has to have BM and can't. Last Bm this morning. "Normal" per pt

## 2023-06-27 NOTE — ED Notes (Signed)
Gave pt urinal and encouraged to provide sample.

## 2023-06-28 ENCOUNTER — Encounter (HOSPITAL_COMMUNITY): Payer: Self-pay | Admitting: Family Medicine

## 2023-06-28 ENCOUNTER — Inpatient Hospital Stay (HOSPITAL_COMMUNITY): Payer: MEDICAID

## 2023-06-28 ENCOUNTER — Ambulatory Visit (HOSPITAL_COMMUNITY): Payer: MEDICAID

## 2023-06-28 DIAGNOSIS — Z8249 Family history of ischemic heart disease and other diseases of the circulatory system: Secondary | ICD-10-CM | POA: Diagnosis not present

## 2023-06-28 DIAGNOSIS — R1013 Epigastric pain: Secondary | ICD-10-CM

## 2023-06-28 DIAGNOSIS — R059 Cough, unspecified: Secondary | ICD-10-CM | POA: Diagnosis present

## 2023-06-28 DIAGNOSIS — K8050 Calculus of bile duct without cholangitis or cholecystitis without obstruction: Secondary | ICD-10-CM

## 2023-06-28 DIAGNOSIS — E871 Hypo-osmolality and hyponatremia: Secondary | ICD-10-CM | POA: Diagnosis present

## 2023-06-28 DIAGNOSIS — J449 Chronic obstructive pulmonary disease, unspecified: Secondary | ICD-10-CM | POA: Diagnosis not present

## 2023-06-28 DIAGNOSIS — E876 Hypokalemia: Secondary | ICD-10-CM | POA: Diagnosis present

## 2023-06-28 DIAGNOSIS — Z720 Tobacco use: Secondary | ICD-10-CM

## 2023-06-28 DIAGNOSIS — K8031 Calculus of bile duct with cholangitis, unspecified, with obstruction: Secondary | ICD-10-CM | POA: Diagnosis not present

## 2023-06-28 DIAGNOSIS — M1990 Unspecified osteoarthritis, unspecified site: Secondary | ICD-10-CM | POA: Diagnosis present

## 2023-06-28 DIAGNOSIS — K8309 Other cholangitis: Secondary | ICD-10-CM

## 2023-06-28 DIAGNOSIS — K219 Gastro-esophageal reflux disease without esophagitis: Secondary | ICD-10-CM | POA: Diagnosis present

## 2023-06-28 DIAGNOSIS — F1010 Alcohol abuse, uncomplicated: Secondary | ICD-10-CM | POA: Diagnosis present

## 2023-06-28 DIAGNOSIS — Z8719 Personal history of other diseases of the digestive system: Secondary | ICD-10-CM | POA: Diagnosis not present

## 2023-06-28 DIAGNOSIS — R7401 Elevation of levels of liver transaminase levels: Secondary | ICD-10-CM

## 2023-06-28 DIAGNOSIS — R1010 Upper abdominal pain, unspecified: Secondary | ICD-10-CM

## 2023-06-28 DIAGNOSIS — K709 Alcoholic liver disease, unspecified: Secondary | ICD-10-CM | POA: Diagnosis present

## 2023-06-28 DIAGNOSIS — F17210 Nicotine dependence, cigarettes, uncomplicated: Secondary | ICD-10-CM | POA: Diagnosis present

## 2023-06-28 DIAGNOSIS — E538 Deficiency of other specified B group vitamins: Secondary | ICD-10-CM | POA: Diagnosis present

## 2023-06-28 DIAGNOSIS — D509 Iron deficiency anemia, unspecified: Secondary | ICD-10-CM | POA: Diagnosis present

## 2023-06-28 DIAGNOSIS — Z8711 Personal history of peptic ulcer disease: Secondary | ICD-10-CM | POA: Diagnosis not present

## 2023-06-28 DIAGNOSIS — R7989 Other specified abnormal findings of blood chemistry: Secondary | ICD-10-CM | POA: Diagnosis present

## 2023-06-28 DIAGNOSIS — J4489 Other specified chronic obstructive pulmonary disease: Secondary | ICD-10-CM | POA: Diagnosis present

## 2023-06-28 DIAGNOSIS — I10 Essential (primary) hypertension: Secondary | ICD-10-CM | POA: Diagnosis present

## 2023-06-28 DIAGNOSIS — Z79899 Other long term (current) drug therapy: Secondary | ICD-10-CM | POA: Diagnosis not present

## 2023-06-28 DIAGNOSIS — K831 Obstruction of bile duct: Secondary | ICD-10-CM | POA: Diagnosis not present

## 2023-06-28 DIAGNOSIS — K828 Other specified diseases of gallbladder: Secondary | ICD-10-CM | POA: Diagnosis present

## 2023-06-28 LAB — COMPREHENSIVE METABOLIC PANEL
ALT: 165 U/L — ABNORMAL HIGH (ref 0–44)
AST: 151 U/L — ABNORMAL HIGH (ref 15–41)
Albumin: 3.4 g/dL — ABNORMAL LOW (ref 3.5–5.0)
Alkaline Phosphatase: 243 U/L — ABNORMAL HIGH (ref 38–126)
Anion gap: 10 (ref 5–15)
BUN: 21 mg/dL — ABNORMAL HIGH (ref 6–20)
CO2: 19 mmol/L — ABNORMAL LOW (ref 22–32)
Calcium: 8.9 mg/dL (ref 8.9–10.3)
Chloride: 103 mmol/L (ref 98–111)
Creatinine, Ser: 1.17 mg/dL (ref 0.61–1.24)
GFR, Estimated: >60 mL/min (ref >60)
Glucose, Bld: 116 mg/dL — ABNORMAL HIGH (ref 70–99)
Potassium: 4 mmol/L (ref 3.5–5.1)
Sodium: 132 mmol/L — ABNORMAL LOW (ref 135–145)
Total Bilirubin: 8 mg/dL — ABNORMAL HIGH (ref 0.3–1.2)
Total Protein: 7.6 g/dL (ref 6.5–8.1)

## 2023-06-28 LAB — URINALYSIS, ROUTINE W REFLEX MICROSCOPIC
Bacteria, UA: NONE SEEN
Glucose, UA: NEGATIVE mg/dL
Ketones, ur: NEGATIVE mg/dL
Leukocytes,Ua: NEGATIVE
Nitrite: NEGATIVE
Protein, ur: 30 mg/dL — AB
Specific Gravity, Urine: >1.046 — ABNORMAL HIGH (ref 1.005–1.030)
pH: 5 (ref 5.0–8.0)

## 2023-06-28 LAB — MAGNESIUM
Magnesium: 1.5 mg/dL — ABNORMAL LOW (ref 1.7–2.4)
Magnesium: 1.7 mg/dL (ref 1.7–2.4)

## 2023-06-28 LAB — CBC WITH DIFFERENTIAL/PLATELET
Abs Immature Granulocytes: 1 10*3/uL — ABNORMAL HIGH (ref 0.00–0.07)
Band Neutrophils: 2 %
Basophils Absolute: 0 10*3/uL (ref 0.0–0.1)
Basophils Relative: 0 %
Eosinophils Absolute: 0 10*3/uL (ref 0.0–0.5)
Eosinophils Relative: 0 %
HCT: 36.1 % — ABNORMAL LOW (ref 39.0–52.0)
Hemoglobin: 12.3 g/dL — ABNORMAL LOW (ref 13.0–17.0)
Lymphocytes Relative: 20 %
Lymphs Abs: 3.8 10*3/uL (ref 0.7–4.0)
MCH: 24.7 pg — ABNORMAL LOW (ref 26.0–34.0)
MCHC: 34.1 g/dL (ref 30.0–36.0)
MCV: 72.6 fL — ABNORMAL LOW (ref 80.0–100.0)
Metamyelocytes Relative: 5 %
Monocytes Absolute: 0.2 10*3/uL (ref 0.1–1.0)
Monocytes Relative: 1 %
Neutro Abs: 14.2 10*3/uL — ABNORMAL HIGH (ref 1.7–7.7)
Neutrophils Relative %: 72 %
Platelets: 254 10*3/uL (ref 150–400)
RBC: 4.97 MIL/uL (ref 4.22–5.81)
RDW: 17.8 % — ABNORMAL HIGH (ref 11.5–15.5)
WBC: 19.2 10*3/uL — ABNORMAL HIGH (ref 4.0–10.5)
nRBC: 0 % (ref 0.0–0.2)

## 2023-06-28 LAB — PROTIME-INR
INR: 1.1 (ref 0.8–1.2)
Prothrombin Time: 14.6 seconds (ref 11.4–15.2)

## 2023-06-28 LAB — BLOOD GAS, VENOUS
Acid-base deficit: 1.7 mmol/L (ref 0.0–2.0)
Bicarbonate: 22.1 mmol/L (ref 20.0–28.0)
Drawn by: 7049
O2 Saturation: 79.1 %
Patient temperature: 37.1
pCO2, Ven: 34 mmHg — ABNORMAL LOW (ref 44–60)
pH, Ven: 7.42 (ref 7.25–7.43)
pO2, Ven: 46 mmHg — ABNORMAL HIGH (ref 32–45)

## 2023-06-28 LAB — HEPATITIS PANEL, ACUTE
HCV Ab: NONREACTIVE
Hep A IgM: NONREACTIVE
Hep B C IgM: NONREACTIVE
Hepatitis B Surface Ag: NONREACTIVE

## 2023-06-28 LAB — GLUCOSE, CAPILLARY: Glucose-Capillary: 123 mg/dL — ABNORMAL HIGH (ref 70–99)

## 2023-06-28 LAB — HIV ANTIBODY (ROUTINE TESTING W REFLEX): HIV Screen 4th Generation wRfx: NONREACTIVE

## 2023-06-28 LAB — PROCALCITONIN: Procalcitonin: 62.58 ng/mL

## 2023-06-28 MED ORDER — PANTOPRAZOLE SODIUM 40 MG PO TBEC
40.0000 mg | DELAYED_RELEASE_TABLET | Freq: Two times a day (BID) | ORAL | Status: DC
  Administered 2023-06-28 (×3): 40 mg via ORAL
  Filled 2023-06-28 (×3): qty 1

## 2023-06-28 MED ORDER — MORPHINE SULFATE (PF) 2 MG/ML IV SOLN
2.0000 mg | INTRAVENOUS | Status: DC | PRN
  Filled 2023-06-28: qty 1

## 2023-06-28 MED ORDER — ONDANSETRON HCL 4 MG PO TABS: 4.0000 mg | ORAL_TABLET | Freq: Four times a day (QID) | ORAL | Status: DC | PRN

## 2023-06-28 MED ORDER — LACTATED RINGERS IV SOLN: INTRAVENOUS | Status: DC

## 2023-06-28 MED ORDER — ALBUTEROL SULFATE HFA 108 (90 BASE) MCG/ACT IN AERS: 2.0000 | INHALATION_SPRAY | Freq: Four times a day (QID) | RESPIRATORY_TRACT | Status: DC | PRN

## 2023-06-28 MED ORDER — POTASSIUM CHLORIDE IN NACL 40-0.9 MEQ/L-% IV SOLN
INTRAVENOUS | Status: DC
  Filled 2023-06-28: qty 1000

## 2023-06-28 MED ORDER — METRONIDAZOLE 500 MG/100ML IV SOLN
500.0000 mg | Freq: Once | INTRAVENOUS | Status: AC
Start: 2023-06-28 — End: 2023-06-28
  Administered 2023-06-28: 500 mg via INTRAVENOUS
  Filled 2023-06-28: qty 100

## 2023-06-28 MED ORDER — PIPERACILLIN-TAZOBACTAM 3.375 G IVPB: 3.3750 g | Freq: Three times a day (TID) | INTRAVENOUS | Status: DC

## 2023-06-28 MED ORDER — MORPHINE SULFATE (PF) 2 MG/ML IV SOLN: 2.0000 mg | INTRAVENOUS | Status: DC | PRN

## 2023-06-28 MED ORDER — SODIUM CHLORIDE 0.9 % IV SOLN
2.0000 g | Freq: Once | INTRAVENOUS | Status: AC
  Administered 2023-06-28: 2 g via INTRAVENOUS
  Filled 2023-06-28: qty 20

## 2023-06-28 MED ORDER — ONDANSETRON HCL 4 MG/2ML IJ SOLN
4.0000 mg | Freq: Four times a day (QID) | INTRAMUSCULAR | Status: DC | PRN
  Administered 2023-06-28: 4 mg via INTRAVENOUS
  Filled 2023-06-28: qty 2

## 2023-06-28 MED ORDER — ACETAMINOPHEN 325 MG PO TABS: 650.0000 mg | ORAL_TABLET | Freq: Four times a day (QID) | ORAL | Status: DC | PRN

## 2023-06-28 MED ORDER — ALBUTEROL SULFATE (2.5 MG/3ML) 0.083% IN NEBU: 2.5000 mg | INHALATION_SOLUTION | Freq: Four times a day (QID) | RESPIRATORY_TRACT | Status: DC | PRN

## 2023-06-28 MED ORDER — DOXYCYCLINE HYCLATE 100 MG PO TABS: 100.0000 mg | ORAL_TABLET | Freq: Once | ORAL | Status: DC

## 2023-06-28 MED ORDER — MORPHINE SULFATE (PF) 2 MG/ML IV SOLN
2.0000 mg | INTRAVENOUS | Status: DC | PRN
  Administered 2023-06-28 (×2): 2 mg via INTRAVENOUS
  Filled 2023-06-28: qty 1

## 2023-06-28 MED ORDER — AMLODIPINE BESYLATE 5 MG PO TABS
5.0000 mg | ORAL_TABLET | Freq: Every day | ORAL | Status: DC
  Administered 2023-06-28: 5 mg via ORAL
  Filled 2023-06-28: qty 1

## 2023-06-28 MED ORDER — ACETAMINOPHEN 650 MG RE SUPP: 650.0000 mg | Freq: Four times a day (QID) | RECTAL | Status: DC | PRN

## 2023-06-28 MED ORDER — GADOBUTROL 1 MMOL/ML IV SOLN
7.5000 mL | Freq: Once | INTRAVENOUS | Status: AC | PRN
  Administered 2023-06-28: 7.5 mL via INTRAVENOUS

## 2023-06-28 MED ORDER — PIPERACILLIN-TAZOBACTAM 3.375 G IVPB
3.3750 g | Freq: Three times a day (TID) | INTRAVENOUS | Status: DC
  Administered 2023-06-28 (×3): 3.375 g via INTRAVENOUS
  Filled 2023-06-28 (×3): qty 50

## 2023-06-28 MED ORDER — OXYCODONE HCL 5 MG PO TABS: 5.0000 mg | ORAL_TABLET | ORAL | Status: DC | PRN

## 2023-06-28 NOTE — H&P (Signed)
History and Physical    Patient: Wesley Francis UXL:[id number] DOB: February 03, 1966 DOA: 06/27/2023 DOS: the patient was seen and examined on 06/28/2023 PCP: Fanny Dance, FNP  Patient coming from: Home  Chief Complaint:  Chief Complaint  Patient presents with   Abdominal Pain   HPI: Wesley Francis is a 57 y.o. male with medical history significant of asthma, alcohol abuse, hypertension, presents to the ED with a chief complaint of abdominal pain.  Patient reports that it started on the 21st.  He describes it as epigastric.  It is severe, cramping, constant.  It did improve in the ER with pain medication.  He has had nausea but no vomiting.  He denies any diarrhea or constipation.  His last meal was the morning of presentation.  Patient has not had any dyspnea.  He has had a dry cough but is no more than his normal.  He does not wear oxygen at baseline.  In the ER they did put him on oxygen prior to giving him opiate pain medication.  Patient denies any chest pain, palpitations.  Patient has no other complaints at this time.  Patient does  smoke 1 pack/day.  He declines nicotine patch at this time.  He reports he drinks every other day a couple of drinks.  He reports he has never had withdrawal.  Patient is full code. Review of Systems: As mentioned in the history of present illness. All other systems reviewed and are negative. Past Medical History:  Diagnosis Date   AKI (acute kidney injury) (HCC) 04/04/2016   Anemia    Arthritis    Asthma    ETOH abuse    Folate deficiency 04/05/2016   Heme positive stool 04/05/2016   Hypertension    Past Surgical History:  Procedure Laterality Date   BIOPSY  03/10/2022   Procedure: BIOPSY;  Surgeon: Dolores Frame, MD;  Location: [facility];  Service: Gastroenterology;;   COLONOSCOPY     COLONOSCOPY WITH PROPOFOL N/A 03/02/2017   Procedure: COLONOSCOPY WITH PROPOFOL;  Surgeon: Christena Deem, MD;  Location: [facility] ENDOSCOPY;   Service: Endoscopy;  Laterality: N/A;   COLONOSCOPY WITH PROPOFOL N/A 07/08/2020   Procedure: COLONOSCOPY WITH PROPOFOL;  Surgeon: Regis Bill, MD;  Location: [facility] ENDOSCOPY;  Service: Endoscopy;  Laterality: N/A;   ESOPHAGOGASTRODUODENOSCOPY (EGD) WITH PROPOFOL N/A 07/08/2020   Procedure: ESOPHAGOGASTRODUODENOSCOPY (EGD) WITH PROPOFOL;  Surgeon: Regis Bill, MD;  Location: [facility] ENDOSCOPY;  Service: Endoscopy;  Laterality: N/A;   ESOPHAGOGASTRODUODENOSCOPY (EGD) WITH PROPOFOL N/A 03/10/2022   Procedure: ESOPHAGOGASTRODUODENOSCOPY (EGD) WITH PROPOFOL;  Surgeon: Dolores Frame, MD;  Location: [facility];  Service: Gastroenterology;  Laterality: N/A;   ESOPHAGOGASTRODUODENOSCOPY (EGD) WITH PROPOFOL N/A 06/22/2022   Procedure: ESOPHAGOGASTRODUODENOSCOPY (EGD) WITH PROPOFOL;  Surgeon: Lanelle Bal, DO;  Location: [facility];  Service: Endoscopy;  Laterality: N/A;  11:45am   FRACTURE SURGERY     IR ANGIOGRAM FOLLOW UP STUDY  03/15/2022   IR ANGIOGRAM SELECTIVE EACH ADDITIONAL VESSEL  03/15/2022   IR ANGIOGRAM VISCERAL SELECTIVE  03/15/2022   IR ANGIOGRAM VISCERAL SELECTIVE  03/15/2022   IR EMBO ART  VEN HEMORR LYMPH EXTRAV  INC GUIDE ROADMAPPING  03/15/2022   IR US GUIDE VASC ACCESS RIGHT  03/15/2022   KNEE ARTHROCENTESIS     Social History:  reports that he has been smoking cigarettes. He has been exposed to tobacco smoke. He has never used smokeless tobacco. He reports that he does not currently use alcohol.  He reports that he does not use drugs.  No Known Allergies  Family History  Problem Relation Age of Onset   Hypertension Mother    Stroke Mother    Hypertension Father    Colon cancer Father        45s   Colon polyps Sister        17s   Colon cancer Paternal Uncle        17s   Colon cancer Paternal Uncle        26s   Colon cancer Paternal Uncle        41s    Prior to Admission medications   Medication Sig Start Date End Date Taking? Authorizing Provider   acetaminophen (TYLENOL) 325 MG tablet Take 650 mg by mouth every 6 (six) hours as needed for mild pain.    [provider]  albuterol (PROVENTIL HFA;VENTOLIN HFA) 108 (90 Base) MCG/ACT inhaler Inhale 2 puffs into the lungs every 6 (six) hours as needed for wheezing or shortness of breath. 04/06/16   Elliot Cousin, MD  amLODipine (NORVASC) 5 MG tablet Take 1 tablet (5 mg total) by mouth daily. 03/21/22   Burnadette Pop, MD  Cholecalciferol (VITAMIN D) 2000 units CAPS Take 2,000 Units by mouth every other day.    [provider]  ferrous sulfate 325 (65 FE) MG EC tablet Take 325 mg by mouth 2 (two) times daily.    [provider]  folic acid (FOLVITE) 1 MG tablet Take 1 tablet (1 mg total) by mouth daily. 03/21/22   Burnadette Pop, MD  pantoprazole (PROTONIX) 40 MG tablet Take 40 mg by mouth 2 (two) times daily.    [provider]    Physical Exam: Vitals:   06/28/23 0130 06/28/23 0145 06/28/23 0200 06/28/23 0243  BP: 120/87 117/82 114/85 (!) 155/76  Pulse: 91 93 96 95  Resp:    19  Temp:   98.9 F (37.2 C) 98.3 F (36.8 C)  TempSrc:    Oral  SpO2: 93% 96% 93% 95%  Weight:      Height:       1.  General: Patient lying supine in bed,  no acute distress   2. Psychiatric: Alert and oriented x 3, mood and behavior normal for situation, pleasant and cooperative with exam   3. Neurologic: Speech and language are normal, face is symmetric, moves all 4 extremities voluntarily, at baseline without acute deficits on limited exam   4. HEENMT:  Head is atraumatic, normocephalic, pupils reactive to light, neck is supple, trachea is midline, mucous membranes are moist   5. Respiratory : Lungs are clear to auscultation bilaterally without wheezing, rhonchi, rales, no cyanosis, no increase in work of breathing or accessory muscle use   6. Cardiovascular : Heart rate normal, rhythm is regular, no murmurs, rubs or gallops, no peripheral edema, peripheral pulses  palpated   7. Gastrointestinal:  Abdomen is soft, nondistended, epigastric tenderness to palpation bowel sounds active, no masses or organomegaly palpated   8. Skin:  Skin is warm, dry and intact without rashes, acute lesions, or ulcers on limited exam   9.Musculoskeletal:  No acute deformities or trauma, no asymmetry in tone, no peripheral edema, peripheral pulses palpated, no tenderness to palpation in the extremities  Data Reviewed: In the ED Temp 98.9, heart rate 95-103, respiratory rate 18, blood pressure 110/73-125/80, satting 90-94% Patient requiring 2 L nasal cannula Leukocytosis at 22.6, hemoglobin 12.5 Hypokalemia 2.8 Decreased bicarbonate 15 although VBG shows  a pH of 7.4 Gap is 11 AST 863, ALT 162, T. bili 7.5 CT shows concern for choledocholithiasis Patient was given Rocephin and Flagyl in the ED as well as morphine Zofran and Protonix Admission requested for abdominal pain with transaminitis  Assessment and Plan: * Transaminitis - AST 163 1 previously 17 - ALT 162 and previously 10 - T. bili 7.5 - Alk phos 262 - CT abdomen pelvis shows leaves concern for choledocholithiasis including new biliary ductal dilatation both intrahepatic and extrahepatic without discrete obstructing lesion - Right upper quadrant ultrasound ordered for the a.m. - GI consult placed but they have not been notified as of yet - Patient received Rocephin and Flagyl in the ED - Continue Rocephin - Trend in the a.m.  Hypokalemia - Potassium 2.8 - 30 mEq of potassium given in the ED - Continue IV fluids with 40 mEq potassium - Trend in the a.m.  Tobacco abuse - Patient reports he smokes a pack per day - She declines nicotine patch at this time - Continue to monitor  COPD (chronic obstructive pulmonary disease) (HCC) - Continue rescue inhaler - Counseled on importance of smoking cessation  GERD (gastroesophageal reflux disease) - Continue PPI      Advance Care Planning:   Code  Status: Full Code  Consults: GI  Family Communication: 2 sisters at bedside  Severity of Illness: The appropriate patient status for this patient is INPATIENT. Inpatient status is judged to be reasonable and necessary in order to provide the required intensity of service to ensure the patient's safety. The patient's presenting symptoms, physical exam findings, and initial radiographic and laboratory data in the context of their chronic comorbidities is felt to place them at high risk for further clinical deterioration. Furthermore, it is not anticipated that the patient will be medically stable for discharge from the hospital within 2 midnights of admission.   * I certify that at the point of admission it is my clinical judgment that the patient will require inpatient hospital care spanning beyond 2 midnights from the point of admission due to high intensity of service, high risk for further deterioration and high frequency of surveillance required.*  Author: Lilyan Gilford, DO 06/28/2023 6:00 AM  For on call review [email or web address].

## 2023-06-28 NOTE — ED Notes (Signed)
ED TO INPATIENT HANDOFF REPORT  ED Nurse Name and Phone #: Karoline Caldwell [phone]  S Name/Age/Gender Wesley Francis 57 y.o. male Room/Bed: APA14/APA14  Code Status   Code Status: Full Code  Home/SNF/Other Home Patient oriented to: self, place, time, and situation Is this baseline? Yes   Triage Complete: Triage complete  Chief Complaint Transaminitis [R74.01]  Triage Note Upper ABD Appt to have [place] tomorrow  Pt stated that it feels like he has to have BM and can't. Last Bm this morning. "Normal" per pt    Allergies No Known Allergies  Level of Care/Admitting Diagnosis ED Disposition     ED Disposition  Admit   Condition  --   Comment  Hospital Area: [facility] [[id number]]  Level of Care: Telemetry [5]  Covid Evaluation: Asymptomatic - no recent exposure (last 10 days) testing not required  Diagnosis: Transaminitis [130865]  Admitting Physician: Lilyan Gilford [[id number]]  Attending Physician: Lilyan Gilford [[id number]]  Certification:: I certify this patient will need inpatient services for at least 2 midnights  Estimated Length of Stay: 2          B Medical/Surgery History Past Medical History:  Diagnosis Date   AKI (acute kidney injury) (HCC) 04/04/2016   Anemia    Arthritis    Asthma    ETOH abuse    Folate deficiency 04/05/2016   Heme positive stool 04/05/2016   Hypertension    Past Surgical History:  Procedure Laterality Date   BIOPSY  03/10/2022   Procedure: BIOPSY;  Surgeon: Dolores Frame, MD;  [facility];  Service: Gastroenterology;;   COLONOSCOPY     COLONOSCOPY WITH PROPOFOL N/A 03/02/2017   Procedure: COLONOSCOPY WITH PROPOFOL;  Surgeon: Christena Deem, MD;  Location: [facility] ENDOSCOPY;  Service: Endoscopy;  Laterality: N/A;   COLONOSCOPY WITH PROPOFOL N/A 07/08/2020   Procedure: COLONOSCOPY WITH PROPOFOL;  Surgeon: Regis Bill, MD;  Location: [facility] ENDOSCOPY;  Service: Endoscopy;  Laterality: N/A;    ESOPHAGOGASTRODUODENOSCOPY (EGD) WITH PROPOFOL N/A 07/08/2020   Procedure: ESOPHAGOGASTRODUODENOSCOPY (EGD) WITH PROPOFOL;  Surgeon: Regis Bill, MD;  Location: [facility] ENDOSCOPY;  Service: Endoscopy;  Laterality: N/A;   ESOPHAGOGASTRODUODENOSCOPY (EGD) WITH PROPOFOL N/A 03/10/2022   Procedure: ESOPHAGOGASTRODUODENOSCOPY (EGD) WITH PROPOFOL;  Surgeon: Dolores Frame, MD;  [facility];  Service: Gastroenterology;  Laterality: N/A;   ESOPHAGOGASTRODUODENOSCOPY (EGD) WITH PROPOFOL N/A 06/22/2022   Procedure: ESOPHAGOGASTRODUODENOSCOPY (EGD) WITH PROPOFOL;  Surgeon: Lanelle Bal, DO;  [facility];  Service: Endoscopy;  Laterality: N/A;  11:45am   FRACTURE SURGERY     IR ANGIOGRAM FOLLOW UP STUDY  03/15/2022   IR ANGIOGRAM SELECTIVE EACH ADDITIONAL VESSEL  03/15/2022   IR ANGIOGRAM VISCERAL SELECTIVE  03/15/2022   IR ANGIOGRAM VISCERAL SELECTIVE  03/15/2022   IR EMBO ART  VEN HEMORR LYMPH EXTRAV  INC GUIDE ROADMAPPING  03/15/2022   IR US GUIDE VASC ACCESS RIGHT  03/15/2022   KNEE ARTHROCENTESIS       A IV Location/Drains/Wounds Patient Lines/Drains/Airways Status     Active Line/Drains/Airways     Name Placement date Placement time Site Days   Peripheral IV 06/27/23 20 G Left;Posterior Wrist 06/27/23  2210  Wrist  1            Intake/Output Last 24 hours No intake or output data in the 24 hours ending 06/28/23 0214  Labs/Imaging Results for orders placed or performed during the hospital encounter of 06/27/23 (from the past 48 hour(s))  Lipase, blood     Status: None   Collection Time: 06/27/23  8:30 PM  Result Value Ref Range   Lipase 22 11 - 51 U/L    Comment: Performed at [facility], [address] [place], [place] [address]  Comprehensive metabolic panel     Status: Abnormal   Collection Time: 06/27/23  8:30 PM  Result Value Ref Range   Sodium 130 (L) 135 - 145 mmol/L   Potassium 2.8 (L) 3.5 - 5.1 mmol/L   Chloride 104 98 - 111 mmol/L    CO2 15 (L) 22 - 32 mmol/L   Glucose, Bld 135 (H) 70 - 99 mg/dL    Comment: Glucose reference range applies only to samples taken after fasting for at least 8 hours.   BUN 21 (H) 6 - 20 mg/dL   Creatinine, Ser 6.21 (H) 0.61 - 1.24 mg/dL   Calcium 9.3 8.9 - 30.8 mg/dL   Total Protein 7.9 6.5 - 8.1 g/dL   Albumin 3.7 3.5 - 5.0 g/dL   AST 657 (H) 15 - 41 U/L   ALT 162 (H) 0 - 44 U/L   Alkaline Phosphatase 262 (H) 38 - 126 U/L   Total Bilirubin 7.5 (H) 0.3 - 1.2 mg/dL   GFR, Estimated >84 >69 mL/min    Comment: (NOTE) Calculated using the CKD-EPI Creatinine Equation (2021)    Anion gap 11 5 - 15    Comment: Performed at [facility], [address], [place], [place] [address]  CBC     Status: Abnormal   Collection Time: 06/27/23  8:30 PM  Result Value Ref Range   WBC 22.6 (H) 4.0 - 10.5 K/uL   RBC 5.09 4.22 - 5.81 MIL/uL   Hemoglobin 12.5 (L) 13.0 - 17.0 g/dL   HCT 84.1 (L) 32.4 - 40.1 %   MCV 71.3 (L) 80.0 - 100.0 fL   MCH 24.6 (L) 26.0 - 34.0 pg   MCHC 34.4 30.0 - 36.0 g/dL   RDW 02.7 (H) 25.3 - 66.4 %   Platelets 292 150 - 400 K/uL   nRBC 0.0 0.0 - 0.2 %    Comment: Performed at [facility], [address], [place], [place] [address]  Magnesium     Status: Abnormal   Collection Time: 06/27/23  8:30 PM  Result Value Ref Range   Magnesium 1.5 (L) 1.7 - 2.4 mg/dL    Comment: Performed at [facility], [address] [place], [place] [address]   CT ABDOMEN PELVIS W CONTRAST  Result Date: 06/27/2023 CLINICAL DATA:  Acute abdominal pain EXAM: CT ABDOMEN AND PELVIS WITH CONTRAST TECHNIQUE: Multidetector CT imaging of the abdomen and pelvis was performed using the standard protocol following bolus administration of intravenous contrast. RADIATION DOSE REDUCTION: This exam was performed according to the departmental dose-optimization program which includes automated exposure control, adjustment of the mA and/or kV according to patient size and/or use of iterative reconstruction technique.  CONTRAST:  OMNIPAQUE IOHEXOL 300 MG/ML  SOLN COMPARISON:  05/15/2022 FINDINGS: Lower chest: Mild basilar atelectatic changes bilaterally. Hepatobiliary: Gallbladder is well distended. Mild suggestion of wall thickness and pericholecystic inflammatory change is noted. The common bile duct is significantly distended without definitive obstructing lesion. Intrahepatic biliary ductal dilatation is noted. Pancreas: Pancreas is within normal limits. Spleen: Normal in size without focal abnormality. Adrenals/Urinary Tract: Adrenal glands are within normal limits. Kidneys demonstrate a normal enhancement pattern bilaterally. No renal calculi or obstructive changes are seen. Renal cystic changes noted bilaterally. No follow-up  is recommended. No obstructive changes are seen. The bladder is well distended. Stomach/Bowel: No obstructive or inflammatory changes of the colon are seen. The appendix is well visualized and within normal limits. Small bowel and stomach are unremarkable. No duodenal wall thickening is noted. Embolization coils are noted near the head of the pancreas and second portion of the duodenum consistent with prior pseudoaneurysm embolization. Vascular/Lymphatic: No significant vascular findings are present. No enlarged abdominal or pelvic lymph nodes. Reproductive: Prostate is unremarkable. Other: No abdominal wall hernia or abnormality. No abdominopelvic ascites. Musculoskeletal: No acute or significant osseous findings. IMPRESSION: New biliary ductal dilatation both intrahepatic and extrahepatic without discrete obstructing lesion. Correlate with laboratory values. Distended gallbladder with some suggestion of wall thickening. Ultrasound may be helpful for further evaluation. Changes consistent with prior pseudoaneurysm embolization. Electronically Signed   By: Alcide Clever M.D.   On: 06/27/2023 23:29    Pending Labs Unresulted Labs (From admission, onward)     Start     Ordered   06/28/23 0131   Blood gas, venous  Once,   R        06/28/23 0133   06/27/23 1951  Urinalysis, Routine w reflex microscopic -Urine, Clean Catch  Once,   URGENT       Question:  Specimen Source  Answer:  Urine, Clean Catch   06/27/23 1951   Signed and Held  Procalcitonin  Once,   R       References:    Procalcitonin Lower Respiratory Tract Infection AND Sepsis Procalcitonin Algorithm   Signed and Held   Signed and Held  HIV Antibody (routine testing w rflx)  (HIV Antibody (Routine testing w reflex) panel)  Once,   R        Signed and Held   Signed and Held  Comprehensive metabolic panel  Tomorrow morning,   R        Signed and Held   Signed and Held  Magnesium  Tomorrow morning,   R        Signed and Held   Signed and Held  CBC with Differential/Platelet  Tomorrow morning,   R        Signed and Held   Signed and Held  Hepatitis panel, acute  Once,   R        Signed and Held            Vitals/Pain Today's Vitals   06/28/23 0115 06/28/23 0130 06/28/23 0145 06/28/23 0200  BP: 121/81 120/87 117/82 114/85  Pulse: 91 91 93 96  Resp:      Temp:    98.9 F (37.2 C)  TempSrc:      SpO2: 94% 93% 96% 93%  Weight:      Height:      PainSc:        Isolation Precautions No active isolations  Medications Medications  morphine (PF) 4 MG/ML injection 4 mg (4 mg Intravenous Not Given 06/27/23 2211)  potassium chloride 10 mEq in 100 mL IVPB (10 mEq Intravenous New Bag/Given 06/28/23 0201)  0.9 % NaCl with KCl 40 mEq / L  infusion (has no administration in time range)  pantoprazole (PROTONIX) injection 40 mg (40 mg Intravenous Given 06/27/23 2211)  ondansetron (ZOFRAN) injection 4 mg (4 mg Intravenous Given 06/27/23 2210)  iohexol (OMNIPAQUE) 300 MG/ML solution 100 mL (100 mLs Intravenous Contrast Given 06/27/23 2304)  cefTRIAXone (ROCEPHIN) 2 g in sodium chloride 0.9 % 100 mL IVPB (0 g Intravenous Stopped 06/28/23 0129)  metroNIDAZOLE (FLAGYL) IVPB 500 mg (0 mg Intravenous Stopped 06/28/23 0201)     Mobility walks     Focused Assessments Pulmonary Assessment Handoff:  Lung sounds:  clear  O2 Device: Nasal Cannula O2 Flow Rate (L/min): 2 L/min    R Recommendations: See Admitting Provider Note  Report given to:   Additional Notes:

## 2023-06-28 NOTE — Assessment & Plan Note (Signed)
-   Patient reports he smokes a pack per day - She declines nicotine patch at this time - Continue to monitor

## 2023-06-28 NOTE — Assessment & Plan Note (Signed)
Continue PPI ?

## 2023-06-28 NOTE — Progress Notes (Signed)
[facility] arrived to transfer pt to [facility]. Pt's sisters, Elease Hashimoto and Aram Beecham, notified of pt's transfer at his request. Report called to receiving unit.

## 2023-06-28 NOTE — Hospital Course (Signed)
57 y.o. male with medical history significant of asthma, alcohol abuse, hypertension, presents to the ED with a chief complaint of abdominal pain.  Patient reports that it started on 06/27/23.  He describes it as epigastric.  It is severe, cramping, constant.  It did improve in the ER with pain medication.  He has had nausea but no vomiting.  He denies any diarrhea or constipation.  His last meal was the morning of presentation.  Patient has not had any dyspnea.  He has had a dry cough but is no more than his normal.  He does not wear oxygen at baseline.  In the ER they did put him on oxygen prior to giving him opiate pain medication.  Patient denies any chest pain, palpitations.  Patient has no other complaints at this time.  He was noted to have markedly elevated LFTs.    Patient does  smoke 1 pack/day.  He declines nicotine patch.  He reports he drinks every other day a couple of alcoholic drinks.  He reports he has never had acute alcohol withdrawal.  Patient is full code.

## 2023-06-28 NOTE — Plan of Care (Signed)
  Problem: Education: Goal: Knowledge of General Education information will improve Description Including pain rating scale, medication(s)/side effects and non-pharmacologic comfort measures Outcome: Progressing   

## 2023-06-28 NOTE — Progress Notes (Signed)
06/28/2023 7:51 PM  Called and spoke with [facility]. They have accepted patient to transfer to [facility].  No beds available at [facility] location.  DC orders, DC summary and Emtala completed.  I had called both of his sisters to inform them of possible transfer.    Maryln Manuel, MD

## 2023-06-28 NOTE — Progress Notes (Signed)
PROGRESS NOTE   Wesley Francis  ZOX:[id number] DOB: 1966/05/11 DOA: 06/27/2023 PCP: Fanny Dance, FNP   Chief Complaint  Patient presents with   Abdominal Pain   Level of care: Telemetry  Brief Admission History:   57 y.o. male with medical history significant of asthma, alcohol abuse, hypertension, presents to the ED with a chief complaint of abdominal pain.  Patient reports that it started on 06/27/23.  He describes it as epigastric.  It is severe, cramping, constant.  It did improve in the ER with pain medication.  He has had nausea but no vomiting.  He denies any diarrhea or constipation.  His last meal was the morning of presentation.  Patient has not had any dyspnea.  He has had a dry cough but is no more than his normal.  He does not wear oxygen at baseline.  In the ER they did put him on oxygen prior to giving him opiate pain medication.  Patient denies any chest pain, palpitations.  Patient has no other complaints at this time.  He was noted to have markedly elevated LFTs.    Patient does  smoke 1 pack/day.  He declines nicotine patch.  He reports he drinks every other day a couple of alcoholic drinks.  He reports he has never had acute alcohol withdrawal.  Patient is full code.   Assessment and Plan:  Acute Transaminitis - AST 163 1 previously 17 - ALT 162 and previously 10 - T. bili 7.5 - Alk phos 262 - CT abdomen pelvis shows leaves concern for choledocholithiasis including new biliary ductal dilatation both intrahepatic and extrahepatic without discrete obstructing lesion - Right upper quadrant ultrasound and MRCP ordered. - GI consult requested - Zosyn IV ordered  - Further recommendations to follow  COPD (chronic obstructive pulmonary disease) - Continue rescue inhaler - Counseled on importance of smoking cessation  GERD (gastroesophageal reflux disease) - Continue PPI  Hypokalemia - repleted   Tobacco abuse - Patient reports he smokes a pack per day -  Declined nicotine patch  - Counseled on cessation   DVT prophylaxis:  SCDs Code Status: Full  Family Communication: updated at bedside 7/22 Disposition: Status is: Inpatient   Consultants:  GI  Procedures:  MRCP Abd [place]  Antimicrobials:    Subjective: Pt reports his pain is much better controlled now.   Objective: Vitals:   06/28/23 0145 06/28/23 0200 06/28/23 0243 06/28/23 0730  BP: 117/82 114/85 (!) 155/76 113/68  Pulse: 93 96 95 (!) 102  Resp:   19   Temp:  98.9 F (37.2 C) 98.3 F (36.8 C) 99.8 F (37.7 C)  TempSrc:   Oral Oral  SpO2: 96% 93% 95% 94%  Weight:      Height:       No intake or output data in the 24 hours ending 06/28/23 1126 Filed Weights   06/27/23 1945  Weight: 75.3 kg   Examination:  General exam: Appears calm and comfortable  Respiratory system: Clear to auscultation. Respiratory effort normal. Cardiovascular system: normal S1 & S2 heard. No JVD, murmurs, rubs, gallops or clicks. No pedal edema. Gastrointestinal system: Abdomen is nondistended, soft and mild epigastric tenderness. No organomegaly or masses felt. Normal bowel sounds heard. Central nervous system: Alert and oriented. No focal neurological deficits. Extremities: Symmetric 5 x 5 power. Skin: No rashes, lesions or ulcers. Psychiatry: Judgement and insight appear normal. Mood & affect appropriate.   Data Reviewed: I have personally reviewed following labs and imaging studies  CBC: Recent Labs  Lab 06/27/23 2030 06/28/23 0511  WBC 22.6* 19.2*  NEUTROABS  --  14.2*  HGB 12.5* 12.3*  HCT 36.3* 36.1*  MCV 71.3* 72.6*  PLT 292 254    Basic Metabolic Panel: Recent Labs  Lab 06/27/23 2030 06/28/23 0511  NA 130* 132*  K 2.8* 4.0  CL 104 103  CO2 15* 19*  GLUCOSE 135* 116*  BUN 21* 21*  CREATININE 1.26* 1.17  CALCIUM 9.3 8.9  MG 1.5* 1.7    CBG: Recent Labs  Lab 06/28/23 0726  GLUCAP 123*    No results found for this or any previous visit (from the past 240  hour(s)).   Radiology Studies: US Abdomen Limited RUQ (LIVER/GB)  Result Date: 06/28/2023 CLINICAL DATA:  [id number] Transaminitis [id number] EXAM: ULTRASOUND ABDOMEN LIMITED RIGHT UPPER QUADRANT COMPARISON:  CT scan abdomen and pelvis from 06/27/2023. FINDINGS: Gallbladder: Physiologically distended gallbladder. Minimal amount of sludge noted. There is diffuse gallbladder wall edema measuring up to 8 mm. No pericholecystic free fluid. Sonographic Murphy sign was negative as per the technologist. Common bile duct: Diameter: 19 mm. Dilated extrahepatic bile duct noted. There are also mildly dilated intrahepatic bile ducts. No discrete obstructing mass or calculus noted on this exam. However, note that distal CBD is obscured by bowel gas. Liver: No focal lesion identified. Within normal limits in parenchymal echogenicity. Portal vein is patent on color Doppler imaging with normal direction of blood flow towards the liver. Other: None. IMPRESSION: 1. Dilated intrahepatic and extrahepatic bile ducts. No discrete obstructing mass or calculus noted on this exam. However, note that the distal CBD is obscured by bowel gas. Correlation with MRCP may be helpful for further evaluation. 2. Physiologically distended gallbladder with minimal sludge. Diffuse gallbladder wall edema. No pericholecystic free fluid. Sonographic Eulah Pont sign was negative as per the technologist. Findings are likely secondary to systemic causes. Acute cholecystitis is considered less likely but remains in the differential diagnosis. Correlate clinically. Electronically Signed   By: Jules Schick M.D.   On: 06/28/2023 08:27   CT ABDOMEN PELVIS W CONTRAST  Result Date: 06/27/2023 CLINICAL DATA:  Acute abdominal pain EXAM: CT ABDOMEN AND PELVIS WITH CONTRAST TECHNIQUE: Multidetector CT imaging of the abdomen and pelvis was performed using the standard protocol following bolus administration of intravenous contrast. RADIATION DOSE REDUCTION: This exam was  performed according to the departmental dose-optimization program which includes automated exposure control, adjustment of the mA and/or kV according to patient size and/or use of iterative reconstruction technique. CONTRAST:  OMNIPAQUE IOHEXOL 300 MG/ML  SOLN COMPARISON:  05/15/2022 FINDINGS: Lower chest: Mild basilar atelectatic changes bilaterally. Hepatobiliary: Gallbladder is well distended. Mild suggestion of wall thickness and pericholecystic inflammatory change is noted. The common bile duct is significantly distended without definitive obstructing lesion. Intrahepatic biliary ductal dilatation is noted. Pancreas: Pancreas is within normal limits. Spleen: Normal in size without focal abnormality. Adrenals/Urinary Tract: Adrenal glands are within normal limits. Kidneys demonstrate a normal enhancement pattern bilaterally. No renal calculi or obstructive changes are seen. Renal cystic changes noted bilaterally. No follow-up is recommended. No obstructive changes are seen. The bladder is well distended. Stomach/Bowel: No obstructive or inflammatory changes of the colon are seen. The appendix is well visualized and within normal limits. Small bowel and stomach are unremarkable. No duodenal wall thickening is noted. Embolization coils are noted near the head of the pancreas and second portion of the duodenum consistent with prior pseudoaneurysm embolization. Vascular/Lymphatic: No significant vascular findings are present. No  enlarged abdominal or pelvic lymph nodes. Reproductive: Prostate is unremarkable. Other: No abdominal wall hernia or abnormality. No abdominopelvic ascites. Musculoskeletal: No acute or significant osseous findings. IMPRESSION: New biliary ductal dilatation both intrahepatic and extrahepatic without discrete obstructing lesion. Correlate with laboratory values. Distended gallbladder with some suggestion of wall thickening. Ultrasound may be helpful for further evaluation. Changes  consistent with prior pseudoaneurysm embolization. Electronically Signed   By: Alcide Clever M.D.   On: 06/27/2023 23:29    Scheduled Meds:  amLODipine  5 mg Oral Daily   pantoprazole  40 mg Oral BID   Continuous Infusions:  lactated ringers 125 mL/hr at 06/28/23 0849   piperacillin-tazobactam (ZOSYN)  IV 3.375 g (06/28/23 0850)    LOS: 0 days   Time spent: 30 mins  Makylie Rivere Laural Benes, MD How to contact the [facility] Attending or Consulting provider 7A - 7P or covering provider during after hours 7P -7A, for this patient?  Check the care team in [facility] and look for a) attending/consulting [facility] provider listed and b) the [facility] team listed Log into [email or web address] and use 's universal password to access. If you do not have the password, please contact the hospital operator. Locate the [facility] provider you are looking for under [facility] and page to a number that you can be directly reached. If you still have difficulty reaching the provider, please page the [facility] (Director on Call) for the Hospitalists listed on amion for assistance.  06/28/2023, 11:26 AM

## 2023-06-28 NOTE — Consult Note (Signed)
@LOGO @   Referring Provider: [facility]  Primary Care Physician:  Fanny Dance, FNP Primary Gastroenterologist:  Dr. Marletta Lor  Date of Admission: 06/27/23 Date of Consultation: 06/28/23  Reason for Consultation: Transaminitis, RUQ pain, biliary dilation  HPI:  Wesley Francis is a 57 y.o. 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   ED Course:  Hemodynamically stable, afebrile.Mild intermittent tachycardia.  Labs remarkable for WBC 22.6, hemoglobin 12.5 with microcytic indices, sodium 130, potassium 2.8, AST 163, ALT 162, alk phos 262, total bilirubin 7.5.  Lipase normal.   CT A/P with contrast with new biliary ductal dilation with intrahepatic and extrahepatic without discrete obstructing lesion, distended gallbladder with some suggestion of wall thickening, evidence of prior pseudoaneurysm embolization.  He received Rocephin and Flagyl in the ED, admitted to the hospital service, GI consulted for further evaluation.  Transitioned to Zosyn this morning. Also receiving IV fluids.   RUQ ultrasound today transhepatic bile ducts with no discrete obstructing mass or calculus, physiologic distended gallbladder with minimal sludge, diffuse gallbladder wall edema, negative Murphy sign.  MRI/MRCP has been ordered.   Labs today with bilirubin up to 8, LFTs fairly stable.  WBC down to 19.2.  Hemoglobin stable.  Acute hepatitis panel negative.  Consult:  Reports he was feeling well until he developed acute onset upper abdominal pain yesterday while he was laying down. Primarily epigastric and LUQ. Associated nausea but no vomiting. No heartburn, dysphagia, brbpr, melena.   Bowels have been moving well without constipation or diarrhea.   Reports he doesn't have any pain now as he has pain medications on board.   Continue to drink 2-3, 12 oz beer a day.     Past Medical History:  Diagnosis Date   AKI (acute kidney injury) (HCC) 04/04/2016   Anemia    Arthritis    Asthma    ETOH abuse    Folate deficiency 04/05/2016   Heme positive stool 04/05/2016   Hypertension     Past Surgical History:  Procedure Laterality Date   BIOPSY  03/10/2022   Procedure: BIOPSY;  Surgeon: Dolores Frame, MD;  Location: [facility];  Service: Gastroenterology;;   COLONOSCOPY     COLONOSCOPY WITH PROPOFOL N/A 03/02/2017   Procedure: COLONOSCOPY WITH PROPOFOL;  Surgeon: Christena Deem, MD;  Location: [facility] ENDOSCOPY;  Service: Endoscopy;  Laterality: N/A;   COLONOSCOPY WITH PROPOFOL N/A 07/08/2020   Procedure: COLONOSCOPY WITH PROPOFOL;  Surgeon: Regis Bill, MD;  Location: [facility] ENDOSCOPY;  Service: Endoscopy;  Laterality: N/A;   ESOPHAGOGASTRODUODENOSCOPY (EGD) WITH PROPOFOL N/A 07/08/2020   Procedure: ESOPHAGOGASTRODUODENOSCOPY (EGD) WITH PROPOFOL;  Surgeon: Regis Bill, MD;  Location: [facility] ENDOSCOPY;  Service: Endoscopy;  Laterality: N/A;   ESOPHAGOGASTRODUODENOSCOPY (EGD) WITH PROPOFOL N/A 03/10/2022   Procedure: ESOPHAGOGASTRODUODENOSCOPY (EGD) WITH PROPOFOL;  Surgeon: Dolores Frame, MD;  Location: [facility];  Service: Gastroenterology;  Laterality: N/A;   ESOPHAGOGASTRODUODENOSCOPY (EGD) WITH PROPOFOL N/A 06/22/2022   Procedure: ESOPHAGOGASTRODUODENOSCOPY (EGD) WITH PROPOFOL;  Surgeon: Lanelle Bal, DO;  Location: [facility];  Service: Endoscopy;  Laterality: N/A;  11:45am   FRACTURE SURGERY     IR ANGIOGRAM FOLLOW UP STUDY  03/15/2022  IR ANGIOGRAM SELECTIVE EACH ADDITIONAL VESSEL  03/15/2022   IR ANGIOGRAM VISCERAL SELECTIVE  03/15/2022   IR ANGIOGRAM VISCERAL SELECTIVE  03/15/2022   IR EMBO ART  VEN HEMORR LYMPH EXTRAV  INC  GUIDE ROADMAPPING  03/15/2022   IR US GUIDE VASC ACCESS RIGHT  03/15/2022   KNEE ARTHROCENTESIS      Prior to Admission medications   Medication Sig Start Date End Date Taking? Authorizing Provider  acetaminophen (TYLENOL) 325 MG tablet Take 650 mg by mouth every 6 (six) hours as needed for mild pain.   Yes [provider]  albuterol (PROVENTIL HFA;VENTOLIN HFA) 108 (90 Base) MCG/ACT inhaler Inhale 2 puffs into the lungs every 6 (six) hours as needed for wheezing or shortness of breath. 04/06/16  Yes Elliot Cousin, MD  amLODipine (NORVASC) 5 MG tablet Take 1 tablet (5 mg total) by mouth daily. 03/21/22  Yes Burnadette Pop, MD  Cholecalciferol (VITAMIN D) 2000 units CAPS Take 2,000 Units by mouth every other day.   Yes [provider]  ferrous sulfate 325 (65 FE) MG EC tablet Take 325 mg by mouth 2 (two) times daily.   Yes [provider]  folic acid (FOLVITE) 1 MG tablet Take 1 tablet (1 mg total) by mouth daily. 03/21/22  Yes Burnadette Pop, MD  pantoprazole (PROTONIX) 40 MG tablet Take 40 mg by mouth 2 (two) times daily.   Yes [provider]    Current Facility-Administered Medications  Medication Dose Route Frequency Provider Last Rate Last Admin   acetaminophen (TYLENOL) tablet 650 mg  650 mg Oral Q6H PRN Zierle-Ghosh, Asia B, DO       Or   acetaminophen (TYLENOL) suppository 650 mg  650 mg Rectal Q6H PRN Zierle-Ghosh, Asia B, DO       albuterol (PROVENTIL) (2.5 MG/3ML) 0.083% nebulizer solution 2.5 mg  2.5 mg Nebulization Q6H PRN Zierle-Ghosh, Asia B, DO       amLODipine (NORVASC) tablet 5 mg  5 mg Oral Daily Zierle-Ghosh, Asia B, DO   5 mg at 06/28/23 1610   lactated ringers infusion   Intravenous Continuous Laural Benes, Clanford L, MD 125 mL/hr at 06/28/23 0849 New Bag at 06/28/23 0849   morphine (PF) 2 MG/ML injection 2 mg  2 mg Intravenous Q4H PRN Laural Benes, Clanford L, MD   2 mg at 06/28/23 0838   ondansetron (ZOFRAN) tablet 4 mg  4 mg Oral Q6H PRN  Zierle-Ghosh, Asia B, DO       Or   ondansetron (ZOFRAN) injection 4 mg  4 mg Intravenous Q6H PRN Zierle-Ghosh, Asia B, DO   4 mg at 06/28/23 9604   oxyCODONE (Oxy IR/ROXICODONE) immediate release tablet 5 mg  5 mg Oral Q4H PRN Zierle-Ghosh, Asia B, DO       pantoprazole (PROTONIX) EC tablet 40 mg  40 mg Oral BID Zierle-Ghosh, Asia B, DO   40 mg at 06/28/23 0853   piperacillin-tazobactam (ZOSYN) IVPB 3.375 g  3.375 g Intravenous Q8H Johnson, Clanford L, MD 12.5 mL/hr at 06/28/23 0850 3.375 g at 06/28/23 0850    Allergies as of 06/27/2023   (No Known Allergies)    Family History  Problem Relation Age of Onset   Hypertension Mother    Stroke Mother    Hypertension Father    Colon cancer Father        45s   Colon polyps Sister        15s   Colon cancer Paternal Uncle  78s   Colon cancer Paternal Uncle        31s   Colon cancer Paternal Uncle        66s    Social History   Socioeconomic History   Marital status: Single    Spouse name: Not on file   Number of children: Not on file   Years of education: Not on file   Highest education level: Not on file  Occupational History   Not on file  Tobacco Use   Smoking status: Every Day    Current packs/day: 0.50    Types: Cigarettes    Passive exposure: Current   Smokeless tobacco: Never  Vaping Use   Vaping status: Never Used  Substance and Sexual Activity   Alcohol use: Not Currently    Comment: 24 oz beer daily   Drug use: No   Sexual activity: Not on file  Other Topics Concern   Not on file  Social History Narrative   Not on file   Social Determinants of Health   Financial Resource Strain: Not on file  Food Insecurity: No Food Insecurity (06/28/2023)   Hunger Vital Sign    Worried About Running Out of Food in the Last Year: Never true    Ran Out of Food in the Last Year: Never true  Transportation Needs: No Transportation Needs (06/28/2023)   PRAPARE - Administrator, Civil Service (Medical): No     Lack of Transportation (Non-Medical): No  Physical Activity: Not on file  Stress: Not on file  Social Connections: Not on file  Intimate Partner Violence: Not At Risk (06/28/2023)   Humiliation, Afraid, Rape, and Kick questionnaire    Fear of Current or Ex-Partner: No    Emotionally Abused: No    Physically Abused: No    Sexually Abused: No    Review of Systems: Gen: Denies fever, chills, cold or flu like symptoms, pre-syncope or syncope.  CV: Denies chest pain, heart palpitations. Resp: Denies shortness of breath, cough.  GI: See HPI GU : Denies urinary burning.  MS: Denies joint pain. Derm: Denies rash. Heme:  See HPI  Physical Exam: Vital signs in last 24 hours: Temp:  [98.3 F (36.8 C)-99.8 F (37.7 C)] 99.8 F (37.7 C) (07/22 0730) Pulse Rate:  [88-103] 102 (07/22 0730) Resp:  [18-19] 19 (07/22 0243) BP: (110-155)/(66-87) 113/68 (07/22 0730) SpO2:  [90 %-96 %] 94 % (07/22 0730) Weight:  [75.3 kg] 75.3 kg (07/21 1945)   General:   Alert,  Well-developed, well-nourished, pleasant and cooperative in NAD Head:  Normocephalic and atraumatic. Eyes:  + scleral icterus. Ears:  Normal auditory acuity. Lungs:  Clear throughout to auscultation.   No wheezes, crackles, or rhonchi. No acute distress. Heart:  Regular rate and rhythm; no murmurs, clicks, rubs,  or gallops. Abdomen:  Soft, full, soft. Mild to moderate TTP across upper abdomen, greatest in epigastric and RUQ region. Mild TTP across lower abdomen.  Rectal:  Deferred Msk:  Symmetrical without gross deformities. Normal posture. Extremities:  Without edema. Neurologic:  Alert and  oriented x4;  grossly normal neurologically. Skin:  Intact without significant lesions or rashes. Psych:  Normal mood and affect.  Intake/Output from previous day: No intake/output data recorded. Intake/Output this shift: No intake/output data recorded.  Lab Results: Recent Labs    06/27/23 2030 06/28/23 0511  WBC 22.6* 19.2*   HGB 12.5* 12.3*  HCT 36.3* 36.1*  PLT 292 254   BMET Recent Labs  06/27/23 2030 06/28/23 0511  NA 130* 132*  K 2.8* 4.0  CL 104 103  CO2 15* 19*  GLUCOSE 135* 116*  BUN 21* 21*  CREATININE 1.26* 1.17  CALCIUM 9.3 8.9   LFT Recent Labs    06/27/23 2030 06/28/23 0511  PROT 7.9 7.6  ALBUMIN 3.7 3.4*  AST 163* 151*  ALT 162* 165*  ALKPHOS 262* 243*  BILITOT 7.5* 8.0*   PT/INR No results for input(s): "LABPROT", "INR" in the last 72 hours. Hepatitis Panel Recent Labs    06/28/23 0511  HEPBSAG NON REACTIVE  HCVAB NON REACTIVE  HEPAIGM NON REACTIVE  HEPBIGM NON REACTIVE     Studies/Results: US Abdomen Limited RUQ (LIVER/GB)  Result Date: 06/28/2023 CLINICAL DATA:  [id number] Transaminitis [id number] EXAM: ULTRASOUND ABDOMEN LIMITED RIGHT UPPER QUADRANT COMPARISON:  CT scan abdomen and pelvis from 06/27/2023. FINDINGS: Gallbladder: Physiologically distended gallbladder. Minimal amount of sludge noted. There is diffuse gallbladder wall edema measuring up to 8 mm. No pericholecystic free fluid. Sonographic Murphy sign was negative as per the technologist. Common bile duct: Diameter: 19 mm. Dilated extrahepatic bile duct noted. There are also mildly dilated intrahepatic bile ducts. No discrete obstructing mass or calculus noted on this exam. However, note that distal CBD is obscured by bowel gas. Liver: No focal lesion identified. Within normal limits in parenchymal echogenicity. Portal vein is patent on color Doppler imaging with normal direction of blood flow towards the liver. Other: None. IMPRESSION: 1. Dilated intrahepatic and extrahepatic bile ducts. No discrete obstructing mass or calculus noted on this exam. However, note that the distal CBD is obscured by bowel gas. Correlation with MRCP may be helpful for further evaluation. 2. Physiologically distended gallbladder with minimal sludge. Diffuse gallbladder wall edema. No pericholecystic free fluid. Sonographic Eulah Pont sign was  negative as per the technologist. Findings are likely secondary to systemic causes. Acute cholecystitis is considered less likely but remains in the differential diagnosis. Correlate clinically. Electronically Signed   By: Jules Schick M.D.   On: 06/28/2023 08:27   CT ABDOMEN PELVIS W CONTRAST  Result Date: 06/27/2023 CLINICAL DATA:  Acute abdominal pain EXAM: CT ABDOMEN AND PELVIS WITH CONTRAST TECHNIQUE: Multidetector CT imaging of the abdomen and pelvis was performed using the standard protocol following bolus administration of intravenous contrast. RADIATION DOSE REDUCTION: This exam was performed according to the departmental dose-optimization program which includes automated exposure control, adjustment of the mA and/or kV according to patient size and/or use of iterative reconstruction technique. CONTRAST:  OMNIPAQUE IOHEXOL 300 MG/ML  SOLN COMPARISON:  05/15/2022 FINDINGS: Lower chest: Mild basilar atelectatic changes bilaterally. Hepatobiliary: Gallbladder is well distended. Mild suggestion of wall thickness and pericholecystic inflammatory change is noted. The common bile duct is significantly distended without definitive obstructing lesion. Intrahepatic biliary ductal dilatation is noted. Pancreas: Pancreas is within normal limits. Spleen: Normal in size without focal abnormality. Adrenals/Urinary Tract: Adrenal glands are within normal limits. Kidneys demonstrate a normal enhancement pattern bilaterally. No renal calculi or obstructive changes are seen. Renal cystic changes noted bilaterally. No follow-up is recommended. No obstructive changes are seen. The bladder is well distended. Stomach/Bowel: No obstructive or inflammatory changes of the colon are seen. The appendix is well visualized and within normal limits. Small bowel and stomach are unremarkable. No duodenal wall thickening is noted. Embolization coils are noted near the head of the pancreas and second portion of the duodenum  consistent with prior pseudoaneurysm embolization. Vascular/Lymphatic: No significant vascular findings are present. No enlarged abdominal or  pelvic lymph nodes. Reproductive: Prostate is unremarkable. Other: No abdominal wall hernia or abnormality. No abdominopelvic ascites. Musculoskeletal: No acute or significant osseous findings. IMPRESSION: New biliary ductal dilatation both intrahepatic and extrahepatic without discrete obstructing lesion. Correlate with laboratory values. Distended gallbladder with some suggestion of wall thickening. Ultrasound may be helpful for further evaluation. Changes consistent with prior pseudoaneurysm embolization. Electronically Signed   By: Alcide Clever M.D.   On: 06/27/2023 23:29    Impression: 57 y.o. 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  CT with new intra and extrahepatic biliary duct dilation, distended gallbladder with wall thickening.  He is admitted for further evaluation and management.  GI consulted for assistance.  Abdominal pain/transaminitis/hyperbilirubinemia/dilated biliary system:  Suspect symptoms are related to etiology of dilated biliary system. Unclear if choledocholithiasis vs other etiology such as cholangiocarcinoma. May also have a component of cholecystitis. [place] this morning did not shed any more light on etiology of biliary dilation. MRI/MRCP was completed, but final read is not yet available.  I anticipate that he will likely need ERCP.  This would need to be completed at [facility].  He is currently on Zosyn. Reports no real abdominal pain with pain medications on  board though on exam he has Mild to moderate TTP across upper abdomen, greatest in epigastric and RUQ region. Mild TTP across lower abdomen. Leukocytosis is slowly improving with Zosyn, down to 19.2 today from 22.6 yesterday. No mental status changes.   Notably, he does drink alcohol daily, thus unable to rule out component of alcoholic hepatitis also contributing to transaminitis/hyperbilirubinemia, but I do not suspect this to be the primary etiology. I will check INR, however, wouldn't recommend steroids regardless in the setting of infection/leukocytosis.     Plan: Keep NPO. Follow-up on MRI.  If need for ERCP is confirmed with MRI, this will need to be pursued at [facility].  INR today.  Continue broad-spectrum antibiotics with Zosyn. Continue supportive measures.    LOS: 0 days    06/28/2023, 10:57 AM   Ermalinda Memos, PA-C [facility]

## 2023-06-28 NOTE — Assessment & Plan Note (Addendum)
-   AST 163 1 previously 17 - ALT 162 and previously 10 - T. bili 7.5 - Alk phos 262 - CT abdomen pelvis shows leaves concern for choledocholithiasis including new biliary ductal dilatation both intrahepatic and extrahepatic without discrete obstructing lesion - Right upper quadrant ultrasound ordered for the a.m. - GI consult placed but they have not been notified as of yet - Patient received Rocephin and Flagyl in the ED - Continue Rocephin - Trend in the a.m.

## 2023-06-28 NOTE — Discharge Summary (Addendum)
Physician Discharge Summary  Wesley Francis WJX:[id number] DOB: [birth year] DOA: 06/27/2023  PCP: Fanny Dance, FNP GI: Marletta Lor  Admit date: 06/27/2023 Discharge date: 06/28/2023  Disposition: TRANSFER TO [facility]   Discharge Condition: STABLE   CODE STATUS: FULL DIET: NPO    Brief Hospitalization Summary: Please see all hospital notes, images, labs for full details of the hospitalization. ADMISSION PROVIDER HPI:   57 y.o. male with medical history significant of asthma, alcohol abuse, hypertension, presents to the ED with a chief complaint of abdominal pain.  Patient reports that it started on 06/27/23.  He describes it as epigastric.  It is severe, cramping, constant.  It did improve in the ER with pain medication.  He has had nausea but no vomiting.  He denies any diarrhea or constipation.  His last meal was the morning of presentation.  Patient has not had any dyspnea.  He has had a dry cough but is no more than his normal.  He does not wear oxygen at baseline.  In the ER they did put him on oxygen prior to giving him opiate pain medication.  Patient denies any chest pain, palpitations.  Patient has no other complaints at this time.  He was noted to have markedly elevated LFTs.    Patient does  smoke 1 pack/day.  He declines nicotine patch.  He reports he drinks every other day a couple of alcoholic drinks.  He reports he has never had acute alcohol withdrawal.  Patient is full code.  HOSPITAL COURSE BY PROBLEM  CHOLEDOCHOLITHIASIS HYPERBILIRUBINEMIA Acute Transaminitis Obstructive Jaundice Question of obstructive cholangitis - AST 163 1 previously 17 - ALT 162 and previously 10 - T. bili 7.5 - Alk phos 262 - CT abdomen pelvis shows leaves concern for choledocholithiasis including new biliary ductal dilatation both intrahepatic and extrahepatic without discrete obstructing lesion - Right upper quadrant ultrasound and MRCP showed filling defect in the CBD concerning for  choledocholithiasis. . - GI consulted and ERCP recommended; contacted GI at [facility] and Dr. Russella Dar reviewed case and images and recommended pt have ERCP at a tertiary care facility.  I have reached out to [facility] and GI reviewed and accepted patient for transfer to [facility]  - Zosyn IV ordered    Leukocytosis - continue IV zosyn - follow daily CBC/diff - blood cultures not collected prior to antibiotics - following procalcitonin  COPD (chronic obstructive pulmonary disease) - Continue rescue inhaler - Counseled on importance of smoking cessation   GERD (gastroesophageal reflux disease) - Continue PPI   Hypokalemia - repleted    Tobacco abuse - Patient reports he smokes a pack per day - Declined nicotine patch  - Counseled on cessation   Discharge Diagnoses:  Principal Problem:   Transaminitis Active Problems:   Tobacco abuse   Hypokalemia   GERD (gastroesophageal reflux disease)   COPD (chronic obstructive pulmonary disease) (HCC)   Hyponatremia   Obstructive jaundice   Choledocholithiasis   Acute obstructive cholangitis   Abdominal pain, epigastric   Discharge Instructions:  Allergies as of 06/28/2023   No Known Allergies      Medication List     STOP taking these medications    acetaminophen 325 MG tablet Commonly known as: TYLENOL   albuterol 108 (90 Base) MCG/ACT inhaler Commonly known as: VENTOLIN HFA Replaced by: albuterol (2.5 MG/3ML) 0.083% nebulizer solution   amLODipine 5 MG tablet Commonly known as: NORVASC   ferrous sulfate 325 (65 FE) MG EC tablet   folic  acid 1 MG tablet Commonly known as: FOLVITE   pantoprazole 40 MG tablet Commonly known as: PROTONIX   Vitamin D 50 MCG (2000 UT) Caps       TAKE these medications    albuterol (2.5 MG/3ML) 0.083% nebulizer solution Commonly known as: PROVENTIL Take 3 mLs (2.5 mg total) by nebulization every 6 (six) hours as needed for wheezing or shortness of breath. Replaces:  albuterol 108 (90 Base) MCG/ACT inhaler   morphine (PF) 2 MG/ML injection Inject 1 mL (2 mg total) into the vein every 4 (four) hours as needed.   piperacillin-tazobactam 3.375 GM/50ML IVPB Commonly known as: ZOSYN Inject 50 mLs (3.375 g total) into the vein every 8 (eight) hours.        No Known Allergies Allergies as of 06/28/2023   No Known Allergies      Medication List     STOP taking these medications    acetaminophen 325 MG tablet Commonly known as: TYLENOL   albuterol 108 (90 Base) MCG/ACT inhaler Commonly known as: VENTOLIN HFA Replaced by: albuterol (2.5 MG/3ML) 0.083% nebulizer solution   amLODipine 5 MG tablet Commonly known as: NORVASC   ferrous sulfate 325 (65 FE) MG EC tablet   folic acid 1 MG tablet Commonly known as: FOLVITE   pantoprazole 40 MG tablet Commonly known as: PROTONIX   Vitamin D 50 MCG (2000 UT) Caps       TAKE these medications    albuterol (2.5 MG/3ML) 0.083% nebulizer solution Commonly known as: PROVENTIL Take 3 mLs (2.5 mg total) by nebulization every 6 (six) hours as needed for wheezing or shortness of breath. Replaces: albuterol 108 (90 Base) MCG/ACT inhaler   morphine (PF) 2 MG/ML injection Inject 1 mL (2 mg total) into the vein every 4 (four) hours as needed.   piperacillin-tazobactam 3.375 GM/50ML IVPB Commonly known as: ZOSYN Inject 50 mLs (3.375 g total) into the vein every 8 (eight) hours.        Procedures/Studies: MR ABDOMEN MRCP W WO CONTAST  Result Date: 06/28/2023 CLINICAL DATA:  57 year old male with history of abnormal CT examination. Evaluate for biliary tract obstruction. EXAM: MRI ABDOMEN WITHOUT AND WITH CONTRAST (INCLUDING MRCP) TECHNIQUE: Multiplanar multisequence MR imaging of the abdomen was performed both before and after the administration of intravenous contrast. Heavily T2-weighted images of the biliary and pancreatic ducts were obtained, and three-dimensional MRCP images were rendered by  post processing. CONTRAST:  7.65mL GADAVIST GADOBUTROL 1 MMOL/ML IV SOLN COMPARISON:  Abdominal MRI 03/10/2022. CT of the abdomen and pelvis 06/27/2023. FINDINGS: Lower chest: Unremarkable. Hepatobiliary: Diffuse loss of signal intensity throughout the hepatic parenchyma on in phase dual echo images, and diffuse low T2 signal intensity throughout the hepatic parenchyma, indicative of a background of iron deposition in the liver. No discrete cystic or solid hepatic lesions. Diffuse periportal edema. MRCP images demonstrate mild intra hepatic biliary ductal dilatation. Common hepatic duct is severely dilated measuring up to 16 mm. Common bile duct is also dilated measuring up to 13 mm in the porta hepatis. Signal void in the common bile duct noted on T2 weighted images and MRCP images, concerning for potential choledocholithiasis (although this is the same region that embolization coils were evident on the contemporaneously obtained CT examination). Gallbladder is moderately distended with gallbladder wall edema. Dependent T2 hypointense material within the lumen of the gallbladder likely represents some biliary sludge. Pancreas: No pancreatic mass. No pancreatic ductal dilatation. No pancreatic or peripancreatic fluid collections or inflammatory changes. Spleen: Diffuse loss  of signal intensity throughout the splenic parenchyma on in phase dual echo images and diffuse low T2 signal intensity throughout the splenic parenchyma, indicative of a background of splenic iron deposition. Adrenals/Urinary Tract: Several T1 hypointense, T2 hyperintense, nonenhancing lesions in both kidneys are compatible with simple cysts (Bosniak class 1, no imaging follow-up recommended), largest of which measures 1.7 cm in the upper pole of the right kidney. No suspicious renal lesions. No hydroureteronephrosis in the visualized portions of the abdomen. Bilateral adrenal glands are normal in appearance. Stomach/Bowel: Visualized portions are  unremarkable. Vascular/Lymphatic: No aneurysm identified in the visualized abdominal vasculature. No lymphadenopathy noted in the abdomen. Other: No significant volume of ascites noted in the visualized portions of the peritoneal cavity. Musculoskeletal: No aggressive appearing osseous lesions are noted in the visualized portions of the skeleton. IMPRESSION: 1. Filling defect in the common bile duct. Typically this would be concerning for choledocholithiasis. However, today's study is likely confounded by the presence of embolization coils in this same region which are apparent on the contemporaneously obtained CT examination. Whether this filling defect represents choledocholithiasis, debris in the common bile duct, or is simply artifactual secondary to signal void induced by embolization coils is uncertain. However, there is evidence of biliary obstruction, including both intra and extrahepatic biliary ductal dilatation. Further evaluation with ERCP should be considered if clinically appropriate. 2. Mild periportal edema in the liver. This is nonspecific, but correlation with liver function tests is recommended. 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 4. Additional incidental findings, as above. Electronically Signed   By: Trudie Reed M.D.   On: 06/28/2023 16:03   MR 3D Recon At Scanner  Result Date: 06/28/2023 CLINICAL DATA:  57 year old male with history of abnormal CT examination. Evaluate for biliary tract obstruction. EXAM: MRI ABDOMEN WITHOUT AND WITH CONTRAST (INCLUDING MRCP) TECHNIQUE: Multiplanar multisequence MR imaging of the abdomen was performed both before and after the administration of intravenous contrast. Heavily T2-weighted images of the biliary and pancreatic ducts were obtained, and three-dimensional MRCP images were rendered by post processing. CONTRAST:  7.22mL GADAVIST GADOBUTROL 1 MMOL/ML IV SOLN COMPARISON:  Abdominal MRI 03/10/2022. CT of the abdomen and pelvis 06/27/2023.  FINDINGS: Lower chest: Unremarkable. Hepatobiliary: Diffuse loss of signal intensity throughout the hepatic parenchyma on in phase dual echo images, and diffuse low T2 signal intensity throughout the hepatic parenchyma, indicative of a background of iron deposition in the liver. No discrete cystic or solid hepatic lesions. Diffuse periportal edema. MRCP images demonstrate mild intra hepatic biliary ductal dilatation. Common hepatic duct is severely dilated measuring up to 16 mm. Common bile duct is also dilated measuring up to 13 mm in the porta hepatis. Signal void in the common bile duct noted on T2 weighted images and MRCP images, concerning for potential choledocholithiasis (although this is the same region that embolization coils were evident on the contemporaneously obtained CT examination). Gallbladder is moderately distended with gallbladder wall edema. Dependent T2 hypointense material within the lumen of the gallbladder likely represents some biliary sludge. Pancreas: No pancreatic mass. No pancreatic ductal dilatation. No pancreatic or peripancreatic fluid collections or inflammatory changes. Spleen: Diffuse loss of signal intensity throughout the splenic parenchyma on in phase dual echo images and diffuse low T2 signal intensity throughout the splenic parenchyma, indicative of a background of splenic iron deposition. Adrenals/Urinary Tract: Several T1 hypointense, T2 hyperintense, nonenhancing lesions in both kidneys are compatible with simple cysts (Bosniak class 1, no imaging follow-up recommended), largest of which measures 1.7 cm in  the upper pole of the right kidney. No suspicious renal lesions. No hydroureteronephrosis in the visualized portions of the abdomen. Bilateral adrenal glands are normal in appearance. Stomach/Bowel: Visualized portions are unremarkable. Vascular/Lymphatic: No aneurysm identified in the visualized abdominal vasculature. No lymphadenopathy noted in the abdomen. Other: No  significant volume of ascites noted in the visualized portions of the peritoneal cavity. Musculoskeletal: No aggressive appearing osseous lesions are noted in the visualized portions of the skeleton. IMPRESSION: 1. Filling defect in the common bile duct. Typically this would be concerning for choledocholithiasis. However, today's study is likely confounded by the presence of embolization coils in this same region which are apparent on the contemporaneously obtained CT examination. Whether this filling defect represents choledocholithiasis, debris in the common bile duct, or is simply artifactual secondary to signal void induced by embolization coils is uncertain. However, there is evidence of biliary obstruction, including both intra and extrahepatic biliary ductal dilatation. Further evaluation with ERCP should be considered if clinically appropriate. 2. Mild periportal edema in the liver. This is nonspecific, but correlation with liver function tests is recommended. 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 4. Additional incidental findings, as above. Electronically Signed   By: Trudie Reed M.D.   On: 06/28/2023 16:03   US Abdomen Limited RUQ (LIVER/GB)  Result Date: 06/28/2023 CLINICAL DATA:  [id number] Transaminitis [id number] EXAM: ULTRASOUND ABDOMEN LIMITED RIGHT UPPER QUADRANT COMPARISON:  CT scan abdomen and pelvis from 06/27/2023. FINDINGS: Gallbladder: Physiologically distended gallbladder. Minimal amount of sludge noted. There is diffuse gallbladder wall edema measuring up to 8 mm. No pericholecystic free fluid. Sonographic Murphy sign was negative as per the technologist. Common bile duct: Diameter: 19 mm. Dilated extrahepatic bile duct noted. There are also mildly dilated intrahepatic bile ducts. No discrete obstructing mass or calculus noted on this exam. However, note that distal CBD is obscured by bowel gas. Liver: No focal lesion identified. Within normal limits in parenchymal echogenicity. Portal  vein is patent on color Doppler imaging with normal direction of blood flow towards the liver. Other: None. IMPRESSION: 1. Dilated intrahepatic and extrahepatic bile ducts. No discrete obstructing mass or calculus noted on this exam. However, note that the distal CBD is obscured by bowel gas. Correlation with MRCP may be helpful for further evaluation. 2. Physiologically distended gallbladder with minimal sludge. Diffuse gallbladder wall edema. No pericholecystic free fluid. Sonographic Eulah Pont sign was negative as per the technologist. Findings are likely secondary to systemic causes. Acute cholecystitis is considered less likely but remains in the differential diagnosis. Correlate clinically. Electronically Signed   By: Jules Schick M.D.   On: 06/28/2023 08:27   CT ABDOMEN PELVIS W CONTRAST  Result Date: 06/27/2023 CLINICAL DATA:  Acute abdominal pain EXAM: CT ABDOMEN AND PELVIS WITH CONTRAST TECHNIQUE: Multidetector CT imaging of the abdomen and pelvis was performed using the standard protocol following bolus administration of intravenous contrast. RADIATION DOSE REDUCTION: This exam was performed according to the departmental dose-optimization program which includes automated exposure control, adjustment of the mA and/or kV according to patient size and/or use of iterative reconstruction technique. CONTRAST:  OMNIPAQUE IOHEXOL 300 MG/ML  SOLN COMPARISON:  05/15/2022 FINDINGS: Lower chest: Mild basilar atelectatic changes bilaterally. Hepatobiliary: Gallbladder is well distended. Mild suggestion of wall thickness and pericholecystic inflammatory change is noted. The common bile duct is significantly distended without definitive obstructing lesion. Intrahepatic biliary ductal dilatation is noted. Pancreas: Pancreas is within normal limits. Spleen: Normal in size without focal abnormality. Adrenals/Urinary Tract: Adrenal glands are within  normal limits. Kidneys demonstrate a normal enhancement pattern  bilaterally. No renal calculi or obstructive changes are seen. Renal cystic changes noted bilaterally. No follow-up is recommended. No obstructive changes are seen. The bladder is well distended. Stomach/Bowel: No obstructive or inflammatory changes of the colon are seen. The appendix is well visualized and within normal limits. Small bowel and stomach are unremarkable. No duodenal wall thickening is noted. Embolization coils are noted near the head of the pancreas and second portion of the duodenum consistent with prior pseudoaneurysm embolization. Vascular/Lymphatic: No significant vascular findings are present. No enlarged abdominal or pelvic lymph nodes. Reproductive: Prostate is unremarkable. Other: No abdominal wall hernia or abnormality. No abdominopelvic ascites. Musculoskeletal: No acute or significant osseous findings. IMPRESSION: New biliary ductal dilatation both intrahepatic and extrahepatic without discrete obstructing lesion. Correlate with laboratory values. Distended gallbladder with some suggestion of wall thickening. Ultrasound may be helpful for further evaluation. Changes consistent with prior pseudoaneurysm embolization. Electronically Signed   By: Alcide Clever M.D.   On: 06/27/2023 23:29      Discharge Exam: Vitals:   06/28/23 1204 06/28/23 1541  BP: 101/70 107/69  Pulse: 96 92  Resp:    Temp: 99.1 F (37.3 C) 99.1 F (37.3 C)  SpO2: 96% 100%   Vitals:   06/28/23 0243 06/28/23 0730 06/28/23 1204 06/28/23 1541  BP: (!) 155/76 113/68 101/70 107/69  Pulse: 95 (!) 102 96 92  Resp: 19     Temp: 98.3 F (36.8 C) 99.8 F (37.7 C) 99.1 F (37.3 C) 99.1 F (37.3 C)  TempSrc: Oral Oral Oral Oral  SpO2: 95% 94% 96% 100%  Weight:      Height:       General exam: Appears calm and comfortable  Respiratory system: Clear to auscultation. Respiratory effort normal. Cardiovascular system: normal S1 & S2 heard. No JVD, murmurs, rubs, gallops or clicks. No pedal  edema. Gastrointestinal system: Abdomen is nondistended, soft and mild epigastric tenderness. No organomegaly or masses felt. Normal bowel sounds heard. Central nervous system: Alert and oriented. No focal neurological deficits. Extremities: Symmetric 5 x 5 power. Skin: No rashes, lesions or ulcers. Psychiatry: Judgement and insight appear normal. Mood & affect appropriate   The results of significant diagnostics from this hospitalization (including imaging, microbiology, ancillary and laboratory) are listed below for reference.     Microbiology: No results found for this or any previous visit (from the past 240 hour(s)).   Labs: BNP (last 3 results) No results for input(s): "BNP" in the last 8760 hours. Basic Metabolic Panel: Recent Labs  Lab 06/27/23 2030 06/28/23 0511  NA 130* 132*  K 2.8* 4.0  CL 104 103  CO2 15* 19*  GLUCOSE 135* 116*  BUN 21* 21*  CREATININE 1.26* 1.17  CALCIUM 9.3 8.9  MG 1.5* 1.7   Liver Function Tests: Recent Labs  Lab 06/27/23 2030 06/28/23 0511  AST 163* 151*  ALT 162* 165*  ALKPHOS 262* 243*  BILITOT 7.5* 8.0*  PROT 7.9 7.6  ALBUMIN 3.7 3.4*   Recent Labs  Lab 06/27/23 2030  LIPASE 22   No results for input(s): "AMMONIA" in the last 168 hours. CBC: Recent Labs  Lab 06/27/23 2030 06/28/23 0511  WBC 22.6* 19.2*  NEUTROABS  --  14.2*  HGB 12.5* 12.3*  HCT 36.3* 36.1*  MCV 71.3* 72.6*  PLT 292 254   Cardiac Enzymes: No results for input(s): "CKTOTAL", "CKMB", "CKMBINDEX", "TROPONINI" in the last 168 hours. BNP: Invalid input(s): "POCBNP" CBG: Recent  Labs  Lab 06/28/23 0726  GLUCAP 123*   D-Dimer No results for input(s): "DDIMER" in the last 72 hours. Hgb A1c No results for input(s): "HGBA1C" in the last 72 hours. Lipid Profile No results for input(s): "CHOL", "HDL", "LDLCALC", "TRIG", "CHOLHDL", "LDLDIRECT" in the last 72 hours. Thyroid function studies No results for input(s): "TSH", "T4TOTAL", "T3FREE",  "THYROIDAB" in the last 72 hours.  Invalid input(s): "FREET3" Anemia work up No results for input(s): "VITAMINB12", "FOLATE", "FERRITIN", "TIBC", "IRON", "RETICCTPCT" in the last 72 hours. Urinalysis    Component Value Date/Time   COLORURINE AMBER (A) 06/28/2023 0259   APPEARANCEUR CLEAR 06/28/2023 0259   LABSPEC >1.046 (H) 06/28/2023 0259   PHURINE 5.0 06/28/2023 0259   GLUCOSEU NEGATIVE 06/28/2023 0259   HGBUR SMALL (A) 06/28/2023 0259   BILIRUBINUR MODERATE (A) 06/28/2023 0259   KETONESUR NEGATIVE 06/28/2023 0259   PROTEINUR 30 (A) 06/28/2023 0259   NITRITE NEGATIVE 06/28/2023 0259   LEUKOCYTESUR NEGATIVE 06/28/2023 0259   Sepsis Labs Recent Labs  Lab 06/27/23 2030 06/28/23 0511  WBC 22.6* 19.2*   Microbiology No results found for this or any previous visit (from the past 240 hour(s)).  Time coordinating discharge: 50 mins  SIGNED:  Standley Dakins, MD  [facility] 06/28/2023, 8:01 PM How to contact the [facility] Attending or Consulting provider 7A - 7P or covering provider during after hours 7P -7A, for this patient?  Check the care team in [facility] and look for a) attending/consulting TRH provider listed and b) the [facility] team listed Log into [email or web address] and use [place]'s universal password to access. If you do not have the password, please contact the hospital operator. Locate the [facility] provider you are looking for under [facility] and page to a number that you can be directly reached. If you still have difficulty reaching the provider, please page the [facility] (Director on Call) for the Hospitalists listed on amion for assistance.

## 2023-06-28 NOTE — Assessment & Plan Note (Signed)
-   Continue rescue inhaler - Counseled on importance of smoking cessation

## 2023-06-28 NOTE — Assessment & Plan Note (Signed)
-   Potassium 2.8 - 30 mEq of potassium given in the ED - Continue IV fluids with 40 mEq potassium - Trend in the a.m.

## 2023-06-28 NOTE — TOC CM/SW Note (Signed)
Transition of Care [facility]) - Inpatient Brief Assessment   Patient Details  Name: Wesley Francis MRN: [id number] Date of Birth: [birth year]  Transition of Care [facility] CM/SW Contact:    Elliot Gault, LCSW Phone Number: 06/28/2023, 10:09 AM   Clinical Narrative:  Transition of Care Department [facility]) has reviewed patient and no TOC needs have been identified at this time. We will continue to monitor patient advancement through interdisciplinary progression rounds. If new patient transition needs arise, please place a TOC consult.  Transition of Care Asessment: Insurance and Status: Insurance coverage has been reviewed Patient has primary care physician: Yes Home environment has been reviewed: from home alone Prior level of function:: independent Prior/Current Home Services: No current home services Social Determinants of Health Reivew: SDOH reviewed no interventions necessary Readmission risk has been reviewed: Yes Transition of care needs: no transition of care needs at this time

## 2023-06-28 NOTE — Progress Notes (Signed)
06/28/2023 6:20 PM  Informed by GI service recommendation for ERCP.  Reached out to GI service at [facility]. Dr. Russella Dar carefully reviewed case and images and he recommended transfer to a tertiary care facility for ERCP given findings seen of embolization coils in the area.   I reached out to [facility] to discuss transfer.  They requested face sheet and requested imaging be pushed out to PACS.  I called radiology and requested that the images be pushed out for [facility] doctors to review.  I left my contact information with the transfer center.   Waiting to hear back from [facility] regarding transfer decision.    Maryln Manuel, MD How to contact the [facility] Attending or Consulting provider 7A - 7P or covering provider during after hours 7P -7A, for this patient?  Check the care team in [facility] and look for a) attending/consulting TRH provider listed and b) the [facility] team listed Log into [email or web address] and use [place]'s universal password to access. If you do not have the password, please contact the hospital operator. Locate the [facility] provider you are looking for under [facility] and page to a number that you can be directly reached. If you still have difficulty reaching the provider, please page the [facility] (Director on Call) for the Hospitalists listed on amion for assistance.

## 2023-07-06 ENCOUNTER — Other Ambulatory Visit (HOSPITAL_COMMUNITY): Payer: Self-pay | Admitting: Gastroenterology

## 2023-07-06 ENCOUNTER — Telehealth: Payer: Self-pay | Admitting: *Deleted

## 2023-07-06 DIAGNOSIS — R109 Unspecified abdominal pain: Secondary | ICD-10-CM

## 2023-07-06 NOTE — Telephone Encounter (Signed)
Pt's sister (DPR) called and states pt was at [facility] ED last Sunday and had to be transferred to [facility] for a ERCP. She wants to make sure that you can see his records from his hospital stay and she wants to make an OV to speak to Dr. Marletta Lor about having pt's gallbladder removed.

## 2023-07-07 ENCOUNTER — Ambulatory Visit (HOSPITAL_COMMUNITY)
Admission: RE | Admit: 2023-07-07 | Discharge: 2023-07-07 | Disposition: A | Discharge status: Home / Self Care | Payer: MEDICAID | Source: Ambulatory Visit | Attending: Gastroenterology | Admitting: Gastroenterology

## 2023-07-07 DIAGNOSIS — R109 Unspecified abdominal pain: Secondary | ICD-10-CM | POA: Diagnosis present

## 2023-07-07 NOTE — Telephone Encounter (Signed)
Reviewed chart.  He underwent ERCP on 7/23. He was noted to have choledocholithiasis with coil embedded in stone . He had stent placed. 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 Coil present from prior coil embolization of GDA. He also had findings consistent with cholangitis. Patient also had prophylactic stent placed on pancreatic duct. He did well post-procedure with no evidence of complications. He was continued on IV Zosyn and ultimately discharged on 14 days of Cipro and Flagyl. Recommended repeat ERCP in 1 month, KUB in 2 weeks to confirm passage of PD stent, otherwise will need to be removed with EGD. He was also referred to HPB surgery given choledochoduodenal fistula. Patient as advised to avoid alcohol.   He is current scheduled to see Dr. Chestine Spore (surgeon) on 8/5.   Recommend he continue to follow with [facility] for management/additional procedure and surgery that is needed considering the complexity of his case.   Recommendations have been relayed to patient's sister.

## 2023-07-08 NOTE — Telephone Encounter (Signed)
Ladell Pier! Agree

## 2023-08-10 NOTE — Progress Notes (Unsigned)
Referring Provider: Fanny Dance, FNP Primary Care Physician:  Fanny Dance, FNP Primary GI Physician: Dr. Marletta Lor  No chief complaint on file.   HPI:   Wesley Francis is a 57 y.o. 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   He was admitted with obstructive jaundice in July 2024, transferred to [facility] in [place] and underwent ERCP on 7/23.  He was noted to have choledocholithiasis with coil embedded in stone.  He had a stent placed.  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  Coil present from prior coil embolization of GDA. He also had findings consistent with cholangitis. Patient also had prophylactic stent placed in pancreatic duct. He did well post-procedure with no evidence of complications. He was continued on IV Zosyn and ultimately discharged on 14 days of Cipro and Flagyl. Recommended repeat ERCP in 1 month, KUB in 2 weeks to confirm passage of PD stent, otherwise will need to be removed with EGD. He was also referred to HPB surgery given choledochoduodenal fistula.   Underwent repeat ERCP 07/27/2023.  He was found to have choledocholithiasis s/p extension of sphincterotomy, stone retrieval and stent placement.  The coils and parts of stone embedded with coil could not be retrieved.  Prophylactic PD stent was removed.  Recommended repeat ERCP with spyglass in 6 weeks.  He was also to follow-up with Dr. Chestine Spore (general surgeon), currently scheduled for 08/23/2023.   Today:    Past Medical History:  Diagnosis Date   AKI (acute kidney injury) (HCC) 04/04/2016   Anemia    Arthritis    Asthma    ETOH abuse    Folate deficiency 04/05/2016   Heme positive stool 04/05/2016   Hypertension      Past Surgical History:  Procedure Laterality Date   BIOPSY  03/10/2022   Procedure: BIOPSY;  Surgeon: Dolores Frame, MD;  [facility];  Service: Gastroenterology;;   COLONOSCOPY     COLONOSCOPY WITH PROPOFOL N/A 03/02/2017   Procedure: COLONOSCOPY WITH PROPOFOL;  Surgeon: Christena Deem, MD;  Location: [facility] ENDOSCOPY;  Service: Endoscopy;  Laterality: N/A;   COLONOSCOPY WITH PROPOFOL N/A 07/08/2020   Procedure: COLONOSCOPY WITH PROPOFOL;  Surgeon: Regis Bill, MD;  Location: [facility] ENDOSCOPY;  Service: Endoscopy;  Laterality: N/A;   ESOPHAGOGASTRODUODENOSCOPY (EGD) WITH PROPOFOL N/A 07/08/2020   Procedure: ESOPHAGOGASTRODUODENOSCOPY (EGD) WITH PROPOFOL;  Surgeon: Regis Bill, MD;  Location: [facility] ENDOSCOPY;  Service: Endoscopy;  Laterality: N/A;   ESOPHAGOGASTRODUODENOSCOPY (EGD) WITH PROPOFOL N/A 03/10/2022   Procedure: ESOPHAGOGASTRODUODENOSCOPY (EGD) WITH PROPOFOL;  Surgeon: Dolores Frame, MD;  [facility];  Service: Gastroenterology;  Laterality: N/A;   ESOPHAGOGASTRODUODENOSCOPY (EGD) WITH PROPOFOL N/A 06/22/2022   Procedure: ESOPHAGOGASTRODUODENOSCOPY (EGD) WITH PROPOFOL;  Surgeon: Lanelle Bal, DO;  [facility];  Service: Endoscopy;  Laterality: N/A;  11:45am   FRACTURE SURGERY     IR ANGIOGRAM FOLLOW UP STUDY  03/15/2022   IR ANGIOGRAM SELECTIVE EACH ADDITIONAL VESSEL  03/15/2022   IR ANGIOGRAM VISCERAL SELECTIVE  03/15/2022   IR ANGIOGRAM VISCERAL SELECTIVE  03/15/2022   IR EMBO ART  VEN HEMORR LYMPH EXTRAV  INC GUIDE ROADMAPPING  03/15/2022   IR US GUIDE VASC ACCESS RIGHT  03/15/2022   KNEE ARTHROCENTESIS      Current Outpatient Medications  Medication Sig Dispense Refill   albuterol (PROVENTIL) (2.5 MG/3ML) 0.083% nebulizer solution Take 3 mLs (2.5 mg total) by nebulization every 6 (six) hours as needed for wheezing or shortness of breath.     Morphine Sulfate (MORPHINE, PF,) 2 MG/ML injection Inject 1 mL (2 mg  total) into the vein every 4 (four) hours as needed.     piperacillin-tazobactam (ZOSYN) 3.375 GM/50ML IVPB Inject 50 mLs (3.375 g total) into the vein every 8 (eight) hours.     No current facility-administered medications for this visit.    Allergies as of 08/12/2023   (No Known Allergies)    Family History  Problem Relation Age of Onset   Hypertension Mother    Stroke Mother    Hypertension Father    Colon cancer Father        52s   Colon polyps Sister        31s   Colon cancer Paternal Uncle        82s   Colon cancer Paternal Uncle        11s   Colon cancer Paternal Uncle        46s    Social History   Socioeconomic History   Marital status: Single    Spouse name: Not on file   Number of children: Not on file   Years of education: Not on file   Highest education level: Not on file  Occupational History   Not on file  Tobacco Use   Smoking status: Every Day    Current packs/day: 0.50    Types: Cigarettes    Passive exposure: Current   Smokeless tobacco: Never  Vaping Use   Vaping status: Never Used  Substance and Sexual Activity   Alcohol use: Not Currently    Comment: 24 oz beer daily   Drug use: No   Sexual activity: Not on file  Other Topics Concern   Not on file  Social History Narrative   Not on file   Social Determinants of Health   Financial Resource Strain: Not on file  Food Insecurity: Low Risk  (07/12/2023)   Received from [facility]   Hunger Vital Sign    Worried About Running Out of Food in the Last Year: Never true    Ran Out of Food in the Last Year: Never true  Transportation Needs: Not on file (07/12/2023)  Physical Activity: Not on file  Stress: Not on file  Social Connections: Not on file    Review of Systems: Gen: Denies fever, chills, anorexia. Denies fatigue, weakness, weight loss.  CV: Denies chest pain, palpitations, syncope, peripheral edema, and claudication. Resp: Denies dyspnea at rest, cough, wheezing, coughing up  blood, and pleurisy. GI: Denies vomiting blood, jaundice, and fecal incontinence.   Denies dysphagia or odynophagia. Derm: Denies rash, itching, dry skin Psych: Denies depression, anxiety, memory loss, confusion. No homicidal or suicidal ideation.  Heme: Denies bruising, bleeding, and enlarged lymph nodes.  Physical Exam: There were no vitals taken for this visit. General:   Alert and oriented. No distress noted. Pleasant and cooperative.  Head:  Normocephalic and atraumatic. Eyes:  Conjuctiva clear without scleral icterus. Heart:  S1, S2 present without murmurs appreciated. Lungs:  Clear to auscultation bilaterally. No wheezes, rales, or rhonchi. No distress.  Abdomen:  +BS, soft, non-tender and non-distended. No rebound or guarding. No HSM or masses noted. Msk:  Symmetrical without gross  deformities. Normal posture. Extremities:  Without edema. Neurologic:  Alert and  oriented x4 Psych:  Normal mood and affect.    Assessment:     Plan:  ***   Ermalinda Memos, PA-C [facility] 08/12/2023

## 2023-08-11 ENCOUNTER — Ambulatory Visit: Payer: MEDICAID | Admitting: Gastroenterology

## 2023-08-12 ENCOUNTER — Ambulatory Visit: Payer: MEDICAID | Admitting: Gastroenterology

## 2023-08-12 ENCOUNTER — Encounter: Payer: Self-pay | Admitting: Gastroenterology

## 2023-08-12 VITALS — BP 112/75 | HR 74 | Temp 97.4°F | Ht 66.0 in | Wt 149.2 lb

## 2023-08-12 DIAGNOSIS — K8050 Calculus of bile duct without cholangitis or cholecystitis without obstruction: Secondary | ICD-10-CM

## 2023-08-12 DIAGNOSIS — K219 Gastro-esophageal reflux disease without esophagitis: Secondary | ICD-10-CM | POA: Diagnosis not present

## 2023-08-12 DIAGNOSIS — K8021 Calculus of gallbladder without cholecystitis with obstruction: Secondary | ICD-10-CM | POA: Diagnosis not present

## 2023-08-12 DIAGNOSIS — Z1211 Encounter for screening for malignant neoplasm of colon: Secondary | ICD-10-CM

## 2023-08-12 NOTE — Patient Instructions (Signed)
Continue following with [facility] for management of gallstones/gallstones in your common bile duct.  Continue following a low-fat diet As we discussed, meats should include chicken, fish, [place].  Meats should be baked, boiled, broiled, or grilled.  No fried foods.  Continue pantoprazole twice daily.  Follow-up to be determined.  Please let me know after Wesley Francis has completed their management of your gallstones and we can arrange for follow-up in our office thereafter.  It was good to see you again today!  Ermalinda Memos, PA-C [facility]

## 2023-11-30 ENCOUNTER — Other Ambulatory Visit: Payer: Self-pay

## 2023-11-30 ENCOUNTER — Emergency Department: Payer: MEDICAID

## 2023-11-30 ENCOUNTER — Encounter
Admission: EM | Disposition: A | Discharge status: Other Acute Inpatient Hospital | Payer: Self-pay | Source: Home / Self Care | Attending: Internal Medicine

## 2023-11-30 ENCOUNTER — Inpatient Hospital Stay
Admission: EM | Admit: 2023-11-30 | Discharge: 2023-12-06 | DRG: 388 | Disposition: A | Discharge status: Other Acute Inpatient Hospital | Payer: MEDICAID | Attending: Internal Medicine | Admitting: Internal Medicine

## 2023-11-30 DIAGNOSIS — K833 Fistula of bile duct: Secondary | ICD-10-CM

## 2023-11-30 DIAGNOSIS — E876 Hypokalemia: Secondary | ICD-10-CM | POA: Diagnosis present

## 2023-11-30 DIAGNOSIS — K56609 Unspecified intestinal obstruction, unspecified as to partial versus complete obstruction: Secondary | ICD-10-CM | POA: Diagnosis not present

## 2023-11-30 DIAGNOSIS — Z83719 Family history of colon polyps, unspecified: Secondary | ICD-10-CM | POA: Diagnosis not present

## 2023-11-30 DIAGNOSIS — M1990 Unspecified osteoarthritis, unspecified site: Secondary | ICD-10-CM | POA: Diagnosis present

## 2023-11-30 DIAGNOSIS — F1010 Alcohol abuse, uncomplicated: Secondary | ICD-10-CM | POA: Diagnosis present

## 2023-11-30 DIAGNOSIS — Z720 Tobacco use: Secondary | ICD-10-CM | POA: Diagnosis not present

## 2023-11-30 DIAGNOSIS — Z800 Family history of malignant neoplasm of digestive organs: Secondary | ICD-10-CM

## 2023-11-30 DIAGNOSIS — E871 Hypo-osmolality and hyponatremia: Secondary | ICD-10-CM | POA: Diagnosis present

## 2023-11-30 DIAGNOSIS — I480 Paroxysmal atrial fibrillation: Secondary | ICD-10-CM | POA: Diagnosis present

## 2023-11-30 DIAGNOSIS — Z1152 Encounter for screening for COVID-19: Secondary | ICD-10-CM | POA: Diagnosis not present

## 2023-11-30 DIAGNOSIS — Z9884 Bariatric surgery status: Secondary | ICD-10-CM

## 2023-11-30 DIAGNOSIS — Z823 Family history of stroke: Secondary | ICD-10-CM

## 2023-11-30 DIAGNOSIS — Z8249 Family history of ischemic heart disease and other diseases of the circulatory system: Secondary | ICD-10-CM

## 2023-11-30 DIAGNOSIS — E1122 Type 2 diabetes mellitus with diabetic chronic kidney disease: Secondary | ICD-10-CM | POA: Diagnosis present

## 2023-11-30 DIAGNOSIS — E872 Acidosis, unspecified: Secondary | ICD-10-CM | POA: Diagnosis present

## 2023-11-30 DIAGNOSIS — K5651 Intestinal adhesions [bands], with partial obstruction: Secondary | ICD-10-CM | POA: Diagnosis present

## 2023-11-30 DIAGNOSIS — E860 Dehydration: Secondary | ICD-10-CM | POA: Diagnosis present

## 2023-11-30 DIAGNOSIS — J449 Chronic obstructive pulmonary disease, unspecified: Secondary | ICD-10-CM | POA: Diagnosis not present

## 2023-11-30 DIAGNOSIS — F17210 Nicotine dependence, cigarettes, uncomplicated: Secondary | ICD-10-CM | POA: Diagnosis present

## 2023-11-30 DIAGNOSIS — Z8711 Personal history of peptic ulcer disease: Secondary | ICD-10-CM | POA: Diagnosis not present

## 2023-11-30 DIAGNOSIS — K219 Gastro-esophageal reflux disease without esophagitis: Secondary | ICD-10-CM | POA: Diagnosis present

## 2023-11-30 DIAGNOSIS — Z9049 Acquired absence of other specified parts of digestive tract: Secondary | ICD-10-CM | POA: Diagnosis not present

## 2023-11-30 DIAGNOSIS — E43 Unspecified severe protein-calorie malnutrition: Secondary | ICD-10-CM | POA: Diagnosis present

## 2023-11-30 DIAGNOSIS — K8050 Calculus of bile duct without cholangitis or cholecystitis without obstruction: Secondary | ICD-10-CM | POA: Diagnosis present

## 2023-11-30 DIAGNOSIS — J4489 Other specified chronic obstructive pulmonary disease: Secondary | ICD-10-CM | POA: Diagnosis present

## 2023-11-30 DIAGNOSIS — Z79899 Other long term (current) drug therapy: Secondary | ICD-10-CM

## 2023-11-30 DIAGNOSIS — Z8719 Personal history of other diseases of the digestive system: Secondary | ICD-10-CM

## 2023-11-30 DIAGNOSIS — J45909 Unspecified asthma, uncomplicated: Secondary | ICD-10-CM | POA: Insufficient documentation

## 2023-11-30 DIAGNOSIS — R7989 Other specified abnormal findings of blood chemistry: Secondary | ICD-10-CM | POA: Insufficient documentation

## 2023-11-30 DIAGNOSIS — N179 Acute kidney failure, unspecified: Secondary | ICD-10-CM | POA: Diagnosis present

## 2023-11-30 DIAGNOSIS — I129 Hypertensive chronic kidney disease with stage 1 through stage 4 chronic kidney disease, or unspecified chronic kidney disease: Secondary | ICD-10-CM | POA: Diagnosis present

## 2023-11-30 LAB — CBC WITH DIFFERENTIAL/PLATELET
Abs Immature Granulocytes: 0.09 10*3/uL — ABNORMAL HIGH (ref 0.00–0.07)
Basophils Absolute: 0 10*3/uL (ref 0.0–0.1)
Basophils Relative: 0 %
Eosinophils Absolute: 0 10*3/uL (ref 0.0–0.5)
Eosinophils Relative: 0 %
HCT: 50.3 % (ref 39.0–52.0)
Hemoglobin: 16.2 g/dL (ref 13.0–17.0)
Immature Granulocytes: 1 %
Lymphocytes Relative: 7 %
Lymphs Abs: 1.1 10*3/uL (ref 0.7–4.0)
MCH: 24.3 pg — ABNORMAL LOW (ref 26.0–34.0)
MCHC: 32.2 g/dL (ref 30.0–36.0)
MCV: 75.3 fL — ABNORMAL LOW (ref 80.0–100.0)
Monocytes Absolute: 1 10*3/uL (ref 0.1–1.0)
Monocytes Relative: 6 %
Neutro Abs: 13.4 10*3/uL — ABNORMAL HIGH (ref 1.7–7.7)
Neutrophils Relative %: 86 %
Platelets: 319 10*3/uL (ref 150–400)
RBC: 6.68 MIL/uL — ABNORMAL HIGH (ref 4.22–5.81)
RDW: 15 % (ref 11.5–15.5)
WBC: 15.6 10*3/uL — ABNORMAL HIGH (ref 4.0–10.5)
nRBC: 0 % (ref 0.0–0.2)

## 2023-11-30 LAB — COMPREHENSIVE METABOLIC PANEL
ALT: 20 U/L (ref 0–44)
AST: 44 U/L — ABNORMAL HIGH (ref 15–41)
Albumin: 5.7 g/dL — ABNORMAL HIGH (ref 3.5–5.0)
Alkaline Phosphatase: 48 U/L (ref 38–126)
Anion gap: 24 — ABNORMAL HIGH (ref 5–15)
BUN: 53 mg/dL — ABNORMAL HIGH (ref 6–20)
CO2: 26 mmol/L (ref 22–32)
Calcium: 10.6 mg/dL — ABNORMAL HIGH (ref 8.9–10.3)
Chloride: 84 mmol/L — ABNORMAL LOW (ref 98–111)
Creatinine, Ser: 4.37 mg/dL — ABNORMAL HIGH (ref 0.61–1.24)
GFR, Estimated: 15 mL/min — ABNORMAL LOW (ref >60)
Glucose, Bld: 145 mg/dL — ABNORMAL HIGH (ref 70–99)
Potassium: 4.4 mmol/L (ref 3.5–5.1)
Sodium: 134 mmol/L — ABNORMAL LOW (ref 135–145)
Total Bilirubin: 1.1 mg/dL (ref <1.2)
Total Protein: 10.1 g/dL — ABNORMAL HIGH (ref 6.5–8.1)

## 2023-11-30 LAB — CK: Total CK: 305 U/L (ref 49–397)

## 2023-11-30 LAB — TROPONIN I (HIGH SENSITIVITY)
Troponin I (High Sensitivity): 21 ng/L — ABNORMAL HIGH (ref <18)
Troponin I (High Sensitivity): 24 ng/L — ABNORMAL HIGH (ref <18)

## 2023-11-30 LAB — RESP PANEL BY RT-PCR (RSV, FLU A&B, COVID)  RVPGX2
Influenza A by PCR: NEGATIVE
Influenza B by PCR: NEGATIVE
Resp Syncytial Virus by PCR: NEGATIVE
SARS Coronavirus 2 by RT PCR: NEGATIVE

## 2023-11-30 LAB — LIPASE, BLOOD: Lipase: 30 U/L (ref 11–51)

## 2023-11-30 LAB — CBG MONITORING, ED: Glucose-Capillary: 155 mg/dL — ABNORMAL HIGH (ref 70–99)

## 2023-11-30 SURGERY — CORONARY/GRAFT ACUTE MI REVASCULARIZATION
Anesthesia: Moderate Sedation

## 2023-11-30 MED ORDER — ONDANSETRON HCL 4 MG/2ML IJ SOLN
4.0000 mg | Freq: Four times a day (QID) | INTRAMUSCULAR | Status: DC | PRN
  Administered 2023-11-30: 4 mg via INTRAVENOUS

## 2023-11-30 MED ORDER — LORAZEPAM 2 MG/ML IJ SOLN
1.0000 mg | Freq: Once | INTRAMUSCULAR | Status: AC
  Administered 2023-11-30: 1 mg via INTRAVENOUS
  Filled 2023-11-30: qty 1

## 2023-11-30 MED ORDER — LACTATED RINGERS IV BOLUS
1000.0000 mL | Freq: Once | INTRAVENOUS | Status: AC
  Administered 2023-11-30: 1000 mL via INTRAVENOUS

## 2023-11-30 MED ORDER — PANTOPRAZOLE SODIUM 40 MG IV SOLR
40.0000 mg | Freq: Once | INTRAVENOUS | Status: AC
  Administered 2023-11-30: 40 mg via INTRAVENOUS
  Filled 2023-11-30: qty 10

## 2023-11-30 MED ORDER — OXYCODONE-ACETAMINOPHEN 5-325 MG PO TABS: 1.0000 | ORAL_TABLET | Freq: Once | ORAL | Status: DC

## 2023-11-30 MED ORDER — ENOXAPARIN SODIUM 30 MG/0.3ML IJ SOSY
30.0000 mg | PREFILLED_SYRINGE | INTRAMUSCULAR | Status: DC
  Administered 2023-11-30 – 2023-12-02 (×3): 30 mg via SUBCUTANEOUS
  Filled 2023-11-30 (×3): qty 0.3

## 2023-11-30 MED ORDER — ONDANSETRON HCL 4 MG PO TABS: 4.0000 mg | ORAL_TABLET | Freq: Four times a day (QID) | ORAL | Status: DC | PRN

## 2023-11-30 MED ORDER — SODIUM CHLORIDE 0.9 % IV SOLN: INTRAVENOUS | Status: AC

## 2023-11-30 MED ORDER — MORPHINE SULFATE (PF) 2 MG/ML IV SOLN
2.0000 mg | Freq: Once | INTRAVENOUS | Status: AC
  Administered 2023-12-01: 2 mg via INTRAVENOUS
  Filled 2023-11-30: qty 1

## 2023-11-30 MED ORDER — METOCLOPRAMIDE HCL 5 MG/ML IJ SOLN
10.0000 mg | INTRAMUSCULAR | Status: AC
  Administered 2023-11-30: 10 mg via INTRAVENOUS
  Filled 2023-11-30: qty 2

## 2023-11-30 NOTE — Assessment & Plan Note (Signed)
Stable from a resp standpoint  Cont home inhalers   

## 2023-11-30 NOTE — ED Notes (Signed)
[facility] spoke with rep. Felicia, gave rep. brief information about the pt. rep stated she is paging the surgical team. Images power shared and demo sheet faxed.

## 2023-11-30 NOTE — Consult Note (Addendum)
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 system  Pt currently NPO  General surgery to assess whether patient appropriate to stay at [facility] vs. Transfer to tertiary facility   Follow up general surgery recommendations    Alcohol abuse Family reports regular ETOH use  CIWA protocol  Monitor   Tobacco abuse 1 PPD smoker  Nicotine patch as appropriate    AKI (acute kidney injury) (HCC) Creatinine form presentation Markedly dry in the setting of overlapping small bowel obstruction Aggressive IV fluid hydration Check FENa Nephrology consult as clinically indicated Hold nephrotoxic agents Monitor  Asthma, chronic Stable from a resp standpoint  Cont home inhalers    Elevated troponin Trop 20s  No active CP  Suspect trace heart strain in setting of SBO and AKI  Monitor   Choledocholithiasis Noted recent complicated abd surgery including 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 common bile duct s/p robotic common bile duct exploration, roux en y hepaticojejunostomy, and cholecystectomy on 10/20/2023 in the [facility] system   COPD (chronic obstructive pulmonary disease) (HCC) Appears stable from respiratory standpoint Continue home inhalers  GERD (gastroesophageal reflux disease) PPI  History of duodenal  ulcer Noted prior nonbleeding duodenal ulcer at the ampulla with adherent clot.S/P IR angiogram and coil embolization of pseudoaneurysm and pancreaticoduodenal Branch 03/2022        TRH will sign off at present, please call us again when needed.  HPI: Wesley Francis is a 57 y.o. 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  History primarily from patient's family as patient is overall poor historian with mild lethargy.  Per report, patient with decreased p.o. intake over the past 3 to 4 days.  No fevers or chills.  Positive nausea.  Family does report patient with regular alcohol use daily cannot specify.  1 pack/day smoker.  Noted to have been recently admitted to the [facility] system for complicated choledocholithiasis including choledocho duodenal fistula due to erosion from GDA coils.  Patient had Roux-en-Y and hepatocode jejunostomy and cholecystectomy in the [facility] system November 2024.  Fairly stable outpatient follow-up thus far. Presented to the ER afebrile, hemodynamically stable.  Satting well on room air.  White count 15.6, hemoglobin 16.2, platelets 319, troponin 20s.  COVID flu and RSV negative.  Creatinine 4.37.  AST 44, ALT 20, T. bili 1.1.  CT abdomen pelvis concerning for small bowel obstruction possibly due to adhesions.  Code STEMI was initially called.  However, this was canceled upon cardiology review.  Review of Systems: unable to review all systems due to the inability of the patient to answer questions.  Past Medical History:  Diagnosis Date   AKI (acute kidney injury) (HCC) 04/04/2016    Anemia    Arthritis    Asthma    ETOH abuse    Folate deficiency 04/05/2016   Heme positive stool 04/05/2016   Hypertension    Past Surgical History:  Procedure Laterality Date   BIOPSY  03/10/2022   Procedure: BIOPSY;  Surgeon: Dolores Frame, MD;  [facility];  Service: Gastroenterology;;   COLONOSCOPY     COLONOSCOPY WITH PROPOFOL N/A 03/02/2017   Procedure: COLONOSCOPY WITH PROPOFOL;  Surgeon: Christena Deem, MD;  Location: [facility] ENDOSCOPY;  Service: Endoscopy;  Laterality: N/A;   COLONOSCOPY WITH PROPOFOL N/A 07/08/2020   Procedure: COLONOSCOPY WITH PROPOFOL;  Surgeon: Regis Bill, MD;  Location: [facility] ENDOSCOPY;  Service: Endoscopy;  Laterality: N/A;   ESOPHAGOGASTRODUODENOSCOPY (EGD) WITH PROPOFOL N/A 07/08/2020   Procedure: ESOPHAGOGASTRODUODENOSCOPY (EGD) WITH PROPOFOL;  Surgeon: Regis Bill, MD;  Location: [facility] ENDOSCOPY;  Service: Endoscopy;  Laterality: N/A;   ESOPHAGOGASTRODUODENOSCOPY (EGD) WITH PROPOFOL N/A 03/10/2022   Procedure: ESOPHAGOGASTRODUODENOSCOPY (EGD) WITH PROPOFOL;  Surgeon: Dolores Frame, MD;  [facility];  Service: Gastroenterology;  Laterality: N/A;   ESOPHAGOGASTRODUODENOSCOPY (EGD) WITH PROPOFOL N/A 06/22/2022   Procedure: ESOPHAGOGASTRODUODENOSCOPY (EGD) WITH PROPOFOL;  Surgeon: Lanelle Bal, DO;  [facility];  Service: Endoscopy;  Laterality: N/A;  11:45am   FRACTURE SURGERY     IR ANGIOGRAM FOLLOW UP STUDY  03/15/2022   IR ANGIOGRAM SELECTIVE EACH ADDITIONAL VESSEL  03/15/2022   IR ANGIOGRAM VISCERAL SELECTIVE  03/15/2022   IR ANGIOGRAM VISCERAL SELECTIVE  03/15/2022   IR EMBO ART  VEN HEMORR LYMPH EXTRAV  INC GUIDE ROADMAPPING  03/15/2022   IR US GUIDE VASC ACCESS RIGHT  03/15/2022   KNEE ARTHROCENTESIS     Social History:  reports that he has been smoking cigarettes. He has been exposed to tobacco smoke. He has never used smokeless tobacco. He reports that he does not currently use  alcohol. He reports that he does not use drugs.  No Known Allergies  Family History  Problem Relation Age of Onset   Hypertension Mother    Stroke Mother    Hypertension Father    Colon cancer Father        73s   Colon polyps Sister        78s   Colon cancer Paternal Uncle        63s   Colon cancer Paternal Uncle        95s   Colon cancer Paternal Uncle        31s    Prior to Admission medications   Medication Sig Start Date End Date Taking? Authorizing Provider  amLODipine (NORVASC) 5 MG tablet Take 5 mg by mouth daily.   Yes [provider]  Cholecalciferol (VITAMIN D3) 50 MCG (2000 UT) capsule Take by mouth. 04/08/23  Yes [provider]  Cholecalciferol 1.25 MG (50000 UT) TABS Take 1 capsule by mouth every other day. 07/23/23  Yes [provider]  folic acid (FOLVITE) 1 MG tablet Take 1 mg by mouth every morning.   Yes [provider]  pantoprazole (PROTONIX) 40 MG tablet Take 40 mg by mouth 2 (two) times daily.   Yes [provider]  albuterol (PROVENTIL) (2.5 MG/3ML) 0.083% nebulizer solution Take 3 mLs (2.5 mg total) by nebulization every 6 (six) hours as needed for wheezing or shortness of breath. Patient not taking: Reported on  08/12/2023 06/28/23   Cleora Fleet, MD    Physical Exam: Vitals:   11/30/23 1630 11/30/23 1700 11/30/23 1730 11/30/23 1930  BP: 122/86 115/88 120/87 111/85  Pulse:  80 92   Resp: (!) 29 (!) 23 (!) 21 (!) 21  Temp:      TempSrc:      SpO2:  95% 100%    Physical Exam Constitutional:      Appearance: He is normal weight.  HENT:     Head: Normocephalic and atraumatic.     Nose: Nose normal.     Mouth/Throat:     Mouth: Mucous membranes are dry.  Eyes:     Pupils: Pupils are equal, round, and reactive to light.  Cardiovascular:     Rate and Rhythm: Normal rate and regular rhythm.  Pulmonary:     Effort: Pulmonary effort is normal.  Abdominal:     General: Bowel sounds are normal.   Musculoskeletal:        General: Normal range of motion.  Skin:    General: Skin is dry.  Neurological:     General: No focal deficit present.  Psychiatric:        Mood and Affect: Mood normal.     Data Reviewed:   There are no new results to review at this time.   Lab Results  Component Value Date   WBC 15.6 (H) 11/30/2023   HGB 16.2 11/30/2023   HCT 50.3 11/30/2023   MCV 75.3 (L) 11/30/2023   PLT 319 11/30/2023   Last metabolic panel Lab Results  Component Value Date   GLUCOSE 145 (H) 11/30/2023   NA 134 (L) 11/30/2023   K 4.4 11/30/2023   CL 84 (L) 11/30/2023   CO2 26 11/30/2023   BUN 53 (H) 11/30/2023   CREATININE 4.37 (H) 11/30/2023   GFRNONAA 15 (L) 11/30/2023   CALCIUM 10.6 (H) 11/30/2023   PHOS 3.4 03/15/2022   PROT 10.1 (H) 11/30/2023   ALBUMIN 5.7 (H) 11/30/2023   BILITOT 1.1 11/30/2023   ALKPHOS 48 11/30/2023   AST 44 (H) 11/30/2023   ALT 20 11/30/2023   ANIONGAP 24 (H) 11/30/2023   CT ABDOMEN PELVIS WO CONTRAST CLINICAL DATA:  Abdominal pain and vomiting for 3 days.  EXAM: CT ABDOMEN AND PELVIS WITHOUT CONTRAST  TECHNIQUE: Multidetector CT imaging of the abdomen and pelvis was performed following the standard protocol without IV contrast.  RADIATION DOSE REDUCTION: This exam was performed according to the departmental dose-optimization program which includes automated exposure control, adjustment of the mA and/or kV according to patient size and/or use of iterative reconstruction technique.  COMPARISON:  06/27/2023  FINDINGS: Lower chest: No acute findings.  Hepatobiliary: No mass visualized on this unenhanced exam. Pneumobilia is seen, consistent with prior sphincterotomy.  Pancreas: Embolization coils again seen adjacent to the pancreatic head. No mass or inflammatory process visualized on this unenhanced exam.  Spleen:  Within normal limits in size.  Adrenals/Urinary tract: No evidence of urolithiasis or hydronephrosis.  Unremarkable unopacified urinary bladder.  Stomach/Bowel: Stomach and proximal small bowel loops are dilated. Surgical staples seen in the proximal small bowel in the right abdomen, with possible transition point noted just distally in the mid abdomen. This raises suspicion for small-bowel obstruction, possibly due to adhesion. No evidence of inflammatory process, or abnormal fluid collections.  Vascular/Lymphatic: No pathologically enlarged lymph nodes identified. No evidence of abdominal aortic aneurysm.  Reproductive:  No mass or other significant abnormality.  Other:  None.  Musculoskeletal:  No suspicious bone lesions identified.  IMPRESSION: Dilated stomach and proximal small bowel, with possible transition point in the mid abdomen. This raises suspicion for small-bowel obstruction, possibly due to adhesion.  No mass or inflammatory process identified.  Electronically Signed   By: Danae Orleans M.D.   On: 11/30/2023 18:08 DG Chest Portable 1 View CLINICAL DATA:  Chest pain, vomiting  EXAM: PORTABLE CHEST 1 VIEW  COMPARISON:  02/15/2022  FINDINGS: Defibrillator pads and other leads overlying the chest. The heart size and mediastinal contours are within normal limits. Both lungs are clear. The visualized skeletal structures are unremarkable.  IMPRESSION: No acute abnormality of the lungs in AP portable projection.  Electronically Signed   By: Jearld Lesch M.D.   On: 11/30/2023 16:49     Family Communication: Family at the bedside  Primary team communication: PLan of care discussed w/ ER team.  Thank you very much for involving [place] in the care of your patient.  Author: Floydene Flock, MD 11/30/2023 7:41 PM  For on call review [email or web address].

## 2023-11-30 NOTE — Assessment & Plan Note (Signed)
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

## 2023-11-30 NOTE — Assessment & Plan Note (Signed)
Creatinine form presentation Markedly dry in the setting of overlapping small bowel obstruction Aggressive IV fluid hydration Check FENa Nephrology consult as clinically indicated Hold nephrotoxic agents Monitor

## 2023-11-30 NOTE — Assessment & Plan Note (Signed)
Noted prior nonbleeding duodenal ulcer at the ampulla with adherent clot.S/P IR angiogram and coil embolization of pseudoaneurysm and pancreaticoduodenal Branch 03/2022

## 2023-11-30 NOTE — H&P (Signed)
History and Physical    Patient: Wesley Francis ZOX:[id number] DOB: 11/19/1966 DOA: 11/30/2023 DOS: the patient was seen and examined on 11/30/2023 PCP: Fanny Dance, FNP  Patient coming from: Home  Chief Complaint:  Chief Complaint  Patient presents with   Spasms   Emesis   HPI: Wesley Francis is a 57 y.o. 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  History primarily from patient's family as patient is overall poor historian with mild lethargy.  Per report, patient with decreased p.o. intake over the past 3 to 4 days.  No fevers or chills.  Positive nausea.  Family does report patient with regular alcohol use daily cannot specify.  1 pack/day smoker.  Noted to have been recently admitted to the [facility] system for complicated choledocholithiasis including choledocho duodenal fistula due to erosion from GDA coils.  Patient had Roux-en-Y and hepatocode jejunostomy and cholecystectomy in the [facility] system November 2024.  Fairly stable outpatient follow-up thus far. Presented to the ER afebrile, hemodynamically stable.  Satting well on room air.  White count 15.6, hemoglobin 16.2, platelets 319, troponin 20s.  COVID flu and RSV negative.  Creatinine 4.37.  AST 44, ALT 20, T. bili 1.1.  CT abdomen pelvis concerning for small bowel obstruction possibly due to adhesions.  Code STEMI was initially called.  However, this was canceled upon cardiology review.  Review of Systems: unable to review all systems due to the inability of the patient to answer questions. Past Medical History:  Diagnosis Date    AKI (acute kidney injury) (HCC) 04/04/2016   Anemia    Arthritis    Asthma    ETOH abuse    Folate deficiency 04/05/2016   Heme positive stool 04/05/2016   Hypertension    Past Surgical History:  Procedure Laterality Date   BIOPSY  03/10/2022   Procedure: BIOPSY;  Surgeon: Dolores Frame, MD;  [facility];  Service: Gastroenterology;;   COLONOSCOPY     COLONOSCOPY WITH PROPOFOL N/A 03/02/2017   Procedure: COLONOSCOPY WITH PROPOFOL;  Surgeon: Christena Deem, MD;  Location: [facility] ENDOSCOPY;  Service: Endoscopy;  Laterality: N/A;   COLONOSCOPY WITH PROPOFOL N/A 07/08/2020   Procedure: COLONOSCOPY WITH PROPOFOL;  Surgeon: Regis Bill, MD;  Location: [facility] ENDOSCOPY;  Service: Endoscopy;  Laterality: N/A;   ESOPHAGOGASTRODUODENOSCOPY (EGD) WITH PROPOFOL N/A 07/08/2020   Procedure: ESOPHAGOGASTRODUODENOSCOPY (EGD) WITH PROPOFOL;  Surgeon: Regis Bill, MD;  Location: [facility] ENDOSCOPY;  Service: Endoscopy;  Laterality: N/A;   ESOPHAGOGASTRODUODENOSCOPY (EGD) WITH PROPOFOL N/A 03/10/2022   Procedure: ESOPHAGOGASTRODUODENOSCOPY (EGD) WITH PROPOFOL;  Surgeon: Dolores Frame, MD;  [facility];  Service: Gastroenterology;  Laterality: N/A;   ESOPHAGOGASTRODUODENOSCOPY (EGD) WITH PROPOFOL N/A 06/22/2022   Procedure: ESOPHAGOGASTRODUODENOSCOPY (EGD) WITH PROPOFOL;  Surgeon: Lanelle Bal, DO;  Location: [facility];  Service: Endoscopy;  Laterality: N/A;  11:45am   FRACTURE SURGERY     IR ANGIOGRAM FOLLOW UP STUDY  03/15/2022   IR ANGIOGRAM SELECTIVE EACH ADDITIONAL VESSEL  03/15/2022   IR ANGIOGRAM VISCERAL SELECTIVE  03/15/2022   IR ANGIOGRAM VISCERAL SELECTIVE  03/15/2022   IR EMBO ART  VEN HEMORR LYMPH EXTRAV  INC GUIDE  ROADMAPPING  03/15/2022   IR US GUIDE VASC ACCESS RIGHT  03/15/2022   KNEE ARTHROCENTESIS     Social History:  reports that he has been smoking cigarettes. He has been exposed to tobacco smoke. He has never used smokeless tobacco. He  reports that he does not currently use alcohol. He reports that he does not use drugs.  No Known Allergies  Family History  Problem Relation Age of Onset   Hypertension Mother    Stroke Mother    Hypertension Father    Colon cancer Father        42s   Colon polyps Sister        81s   Colon cancer Paternal Uncle        15s   Colon cancer Paternal Uncle        34s   Colon cancer Paternal Uncle        38s    Prior to Admission medications   Medication Sig Start Date End Date Taking? Authorizing Provider  amLODipine (NORVASC) 5 MG tablet Take 5 mg by mouth daily.   Yes [provider]  Cholecalciferol (VITAMIN D3) 50 MCG (2000 UT) capsule Take by mouth. 04/08/23  Yes [provider]  Cholecalciferol 1.25 MG (50000 UT) TABS Take 1 capsule by mouth every other day. 07/23/23  Yes [provider]  folic acid (FOLVITE) 1 MG tablet Take 1 mg by mouth every morning.   Yes [provider]  pantoprazole (PROTONIX) 40 MG tablet Take 40 mg by mouth 2 (two) times daily.   Yes [provider]  albuterol (PROVENTIL) (2.5 MG/3ML) 0.083% nebulizer solution Take 3 mLs (2.5 mg total) by nebulization every 6 (six) hours as needed for wheezing or shortness of breath. Patient not taking: Reported on 08/12/2023 06/28/23   Cleora Fleet, MD    Physical Exam: Vitals:   11/30/23 1630 11/30/23 1700 11/30/23 1730 11/30/23 1930  BP: 122/86 115/88 120/87 111/85  Pulse:  80 92   Resp: (!) 29 (!) 23 (!) 21 (!) 21  Temp:      TempSrc:      SpO2:  95% 100%    Physical Exam Constitutional:      Appearance: He is normal weight.  HENT:     Head: Normocephalic and atraumatic.     Nose: Nose normal.     Mouth/Throat:     Mouth: Mucous membranes are dry.  Eyes:     Pupils: Pupils are equal, round, and reactive to light.  Cardiovascular:     Rate and Rhythm: Normal rate and regular rhythm.  Pulmonary:     Effort: Pulmonary effort is normal.  Abdominal:      General: Bowel sounds are normal.  Musculoskeletal:        General: Normal range of motion.  Skin:    General: Skin is dry.  Neurological:     General: No focal deficit present.  Psychiatric:        Mood and Affect: Mood normal.  Data Reviewed:  There are no new results to review at this time.  CT ABDOMEN PELVIS WO CONTRAST CLINICAL DATA:  Abdominal pain and vomiting for 3 days.  EXAM: CT ABDOMEN AND PELVIS WITHOUT CONTRAST  TECHNIQUE: Multidetector CT imaging of the abdomen and pelvis was performed following the standard protocol without IV contrast.  RADIATION DOSE REDUCTION: This exam was performed according to the departmental dose-optimization program which includes automated exposure control, adjustment of the mA and/or kV according  to patient size and/or use of iterative reconstruction technique.  COMPARISON:  06/27/2023  FINDINGS: Lower chest: No acute findings.  Hepatobiliary: No mass visualized on this unenhanced exam. Pneumobilia is seen, consistent with prior sphincterotomy.  Pancreas: Embolization coils again seen adjacent to the pancreatic head. No mass or inflammatory process visualized on this unenhanced exam.  Spleen:  Within normal limits in size.  Adrenals/Urinary tract: No evidence of urolithiasis or hydronephrosis. Unremarkable unopacified urinary bladder.  Stomach/Bowel: Stomach and proximal small bowel loops are dilated. Surgical staples seen in the proximal small bowel in the right abdomen, with possible transition point noted just distally in the mid abdomen. This raises suspicion for small-bowel obstruction, possibly due to adhesion. No evidence of inflammatory process, or abnormal fluid collections.  Vascular/Lymphatic: No pathologically enlarged lymph nodes identified. No evidence of abdominal aortic aneurysm.  Reproductive:  No mass or other significant abnormality.  Other:  None.  Musculoskeletal:  No suspicious bone lesions  identified.  IMPRESSION: Dilated stomach and proximal small bowel, with possible transition point in the mid abdomen. This raises suspicion for small-bowel obstruction, possibly due to adhesion.  No mass or inflammatory process identified.  Electronically Signed   By: Danae Orleans M.D.   On: 11/30/2023 18:08 DG Chest Portable 1 View CLINICAL DATA:  Chest pain, vomiting  EXAM: PORTABLE CHEST 1 VIEW  COMPARISON:  02/15/2022  FINDINGS: Defibrillator pads and other leads overlying the chest. The heart size and mediastinal contours are within normal limits. Both lungs are clear. The visualized skeletal structures are unremarkable.  IMPRESSION: No acute abnormality of the lungs in [facility] portable projection.  Electronically Signed   By: Jearld Lesch M.D.   On: 11/30/2023 16:49  Lab Results  Component Value Date   WBC 15.6 (H) 11/30/2023   HGB 16.2 11/30/2023   HCT 50.3 11/30/2023   MCV 75.3 (L) 11/30/2023   PLT 319 11/30/2023   Last metabolic panel Lab Results  Component Value Date   GLUCOSE 145 (H) 11/30/2023   NA 134 (L) 11/30/2023   K 4.4 11/30/2023   CL 84 (L) 11/30/2023   CO2 26 11/30/2023   BUN 53 (H) 11/30/2023   CREATININE 4.37 (H) 11/30/2023   GFRNONAA 15 (L) 11/30/2023   CALCIUM 10.6 (H) 11/30/2023   PHOS 3.4 03/15/2022   PROT 10.1 (H) 11/30/2023   ALBUMIN 5.7 (H) 11/30/2023   BILITOT 1.1 11/30/2023   ALKPHOS 48 11/30/2023   AST 44 (H) 11/30/2023   ALT 20 11/30/2023   ANIONGAP 24 (H) 11/30/2023    Assessment and Plan: * SBO (small bowel obstruction) (HCC) Progressive nausea, vomiting abd pain w/ noted SBO and adhesions  Noted recent complicated abd surgery including 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 Transfer to tertiary facility   Follow up general surgery recommendations    Alcohol abuse Family reports regular ETOH use  CIWA protocol  Monitor   Tobacco abuse 1 PPD smoker  Nicotine patch as appropriate    AKI (acute kidney injury) (HCC) Creatinine form presentation Markedly dry in the setting of overlapping small bowel obstruction Aggressive IV fluid hydration Check FENa Nephrology consult as clinically indicated Hold nephrotoxic agents Monitor  Asthma, chronic Stable from a resp standpoint  Cont home inhalers  Elevated troponin Trop 20s  No active CP  Suspect trace heart strain in setting of SBO and AKI  Monitor   Choledocholithiasis Noted recent complicated abd surgery including 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 common bile duct s/p robotic common bile duct exploration, roux en y hepaticojejunostomy, and cholecystectomy on 10/20/2023 in the [facility] system   COPD (chronic obstructive pulmonary disease) (HCC) Appears stable from respiratory standpoint Continue home inhalers  GERD (gastroesophageal reflux disease) PPI  History of duodenal ulcer Noted prior nonbleeding duodenal ulcer at the ampulla with adherent clot.S/P IR angiogram and 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 Inpatient status is judged to be reasonable and necessary in order to provide the required intensity of service to ensure the patient's safety. The patient's presenting symptoms, physical exam findings, and initial radiographic and laboratory data in the context of their chronic comorbidities is felt to place them at high risk for further clinical deterioration.  Furthermore, it is not anticipated that the patient will be medically stable for discharge from the hospital within 2 midnights of admission.   * I certify that at the point of admission it is my clinical judgment that the patient will require inpatient hospital care spanning beyond 2 midnights from the point of admission due to high intensity of service, high risk for further deterioration and high frequency of surveillance required.*  Author: Floydene Flock, MD 11/30/2023 10:26 PM  For on call review [email or web address].

## 2023-11-30 NOTE — Assessment & Plan Note (Signed)
PPI ?

## 2023-11-30 NOTE — Assessment & Plan Note (Signed)
Appears stable from respiratory standpoint Continue home inhalers

## 2023-11-30 NOTE — Assessment & Plan Note (Signed)
1 PPD smoker  Nicotine patch as appropriate

## 2023-11-30 NOTE — Assessment & Plan Note (Signed)
Trop 20s  No active CP  Suspect trace heart strain in setting of SBO and AKI  Monitor

## 2023-11-30 NOTE — ED Triage Notes (Signed)
Pt. To ED for muscle cramps/spasms. Pt. Was vomiting en route for EMS. Pt. States he has been vomiting x3 days.EMS initially called STEMI, ED MD and cards cancelled STEMI when pt arrived to ED.

## 2023-11-30 NOTE — Progress Notes (Signed)
  Chaplain On-Call responded to Code STEMI notification at 1530 hours.  The patient was receiving bedside care from the medical team. At 1546, the Code STEMI was cancelled.  Bedside care is continuing for the patient. No family is present.  Chaplain assured ED Staff of availability as needed.  Chaplain Morene Crocker., [facility]

## 2023-11-30 NOTE — Assessment & Plan Note (Signed)
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 Transfer to tertiary facility   Follow up general surgery recommendations

## 2023-11-30 NOTE — Assessment & Plan Note (Signed)
Family reports regular ETOH use  CIWA protocol  Monitor

## 2023-11-30 NOTE — Consult Note (Signed)
Patient ID: Wesley Francis, male   DOB: [birth year], 57 y.o.   MRN: [id number] CC: Abdominal Pain, nausea and vomiting History of Present Illness Wesley Francis is a 57 y.o. male with past medical history as below who presents in consultation for abdominal pain and vomiting.  Patient reports that he has 3-day history of abdominal pain that was associated with emesis.  He says his last bowel movement was Sunday and he has not been passing flatus.  He also reports that he feels more distended.  He describes the pain as cramping in nature.  Of note, the patient underwent a robotic cholecystectomy with common bile duct exploration and Roux and Y hepaticojejunostomy approximately 5 weeks ago.  Denies any other previous abdominal surgery but did have a GDA bleed requiring coiling of the GDA with migration of the coils into the common bile duct.  Past Medical History Past Medical History:  Diagnosis Date   AKI (acute kidney injury) (HCC) 04/04/2016   Anemia    Arthritis    Asthma    ETOH abuse    Folate deficiency 04/05/2016   Heme positive stool 04/05/2016   Hypertension        Past Surgical History:  Procedure Laterality Date   BIOPSY  03/10/2022   Procedure: BIOPSY;  Surgeon: Dolores Frame, MD;  [facility];  Service: Gastroenterology;;   COLONOSCOPY     COLONOSCOPY WITH PROPOFOL N/A 03/02/2017   Procedure: COLONOSCOPY WITH PROPOFOL;  Surgeon: Christena Deem, MD;  Location: [facility] ENDOSCOPY;  Service: Endoscopy;  Laterality: N/A;   COLONOSCOPY WITH PROPOFOL N/A 07/08/2020   Procedure: COLONOSCOPY WITH PROPOFOL;  Surgeon: Regis Bill, MD;  Location: [facility] ENDOSCOPY;  Service: Endoscopy;  Laterality: N/A;   ESOPHAGOGASTRODUODENOSCOPY (EGD) WITH PROPOFOL N/A 07/08/2020   Procedure: ESOPHAGOGASTRODUODENOSCOPY (EGD) WITH PROPOFOL;  Surgeon: Regis Bill, MD;  Location: [facility] ENDOSCOPY;  Service: Endoscopy;  Laterality: N/A;   ESOPHAGOGASTRODUODENOSCOPY (EGD) WITH  PROPOFOL N/A 03/10/2022   Procedure: ESOPHAGOGASTRODUODENOSCOPY (EGD) WITH PROPOFOL;  Surgeon: Dolores Frame, MD;  [facility];  Service: Gastroenterology;  Laterality: N/A;   ESOPHAGOGASTRODUODENOSCOPY (EGD) WITH PROPOFOL N/A 06/22/2022   Procedure: ESOPHAGOGASTRODUODENOSCOPY (EGD) WITH PROPOFOL;  Surgeon: Lanelle Bal, DO;  [facility]: [facility];  Service: Endoscopy;  Laterality: N/A;  11:45am   FRACTURE SURGERY     IR ANGIOGRAM FOLLOW UP STUDY  03/15/2022   IR ANGIOGRAM SELECTIVE EACH ADDITIONAL VESSEL  03/15/2022   IR ANGIOGRAM VISCERAL SELECTIVE  03/15/2022   IR ANGIOGRAM VISCERAL SELECTIVE  03/15/2022   IR EMBO ART  VEN HEMORR LYMPH EXTRAV  INC GUIDE ROADMAPPING  03/15/2022   IR US GUIDE VASC ACCESS RIGHT  03/15/2022   KNEE ARTHROCENTESIS      No Known Allergies  No current facility-administered medications for this encounter.   Current Outpatient Medications  Medication Sig Dispense Refill   amLODipine (NORVASC) 5 MG tablet Take 5 mg by mouth daily.     Cholecalciferol (VITAMIN D3) 50 MCG (2000 UT) capsule Take by mouth.     Cholecalciferol 1.25 MG (50000 UT) TABS Take 1 capsule by mouth every other day.     folic acid (FOLVITE) 1 MG tablet Take 1 mg by mouth every morning.     pantoprazole (PROTONIX) 40 MG tablet Take 40 mg by mouth 2 (two) times daily.     albuterol (PROVENTIL) (2.5 MG/3ML) 0.083% nebulizer solution Take 3 mLs (2.5 mg total) by nebulization every 6 (six) hours as needed  for wheezing or shortness of breath. (Patient not taking: Reported on 08/12/2023)      Family History Family History  Problem Relation Age of Onset   Hypertension Mother    Stroke Mother    Hypertension Father    Colon cancer Father        74s   Colon polyps Sister        50s   Colon cancer Paternal Uncle        31s   Colon cancer Paternal Uncle        75s   Colon cancer Paternal Uncle        42s       Social History Social History   Tobacco Use   Smoking  status: Every Day    Current packs/day: 0.50    Types: Cigarettes    Passive exposure: Current   Smokeless tobacco: Never  Vaping Use   Vaping status: Never Used  Substance Use Topics   Alcohol use: Not Currently    Comment: 24 oz beer daily   Drug use: No        ROS Full ROS of systems performed and is otherwise negative there than what is stated in the HPI  Physical Exam Blood pressure 111/85, pulse 92, temperature (!) 97.5 F (36.4 C), temperature source Oral, resp. rate (!) 21, SpO2 100%.  Patient appears ill and is vomiting during exam.  His mucous membranes are dry.  He is also sleepy but will arouse to voice.  He is breathing normally on room air and is intermittently tachycardic on exam.  His abdomen is soft, distended and tympanic.  The laparoscopic port sites are healing well  Data Reviewed His labs are notable for a leukocytosis as well as an AKI with a creatinine of 4.3.  On his CT scan he has a markedly distended stomach and proximal small bowel.  His hepaticojejunostomy looks okay and there does not look to be a transition point at his JJ anastomosis.  There is a transition point just distal to his JJ anastomosis.  I have personally reviewed the patient's imaging and medical records.    Assessment    57 year old with past surgical history of robotic cholecystectomy with common bile duct exploration and a Roux-en-Y hepaticojejunostomy who presents with 3-day history of abdominal pain, nausea and vomiting.  He is also obstipated.  This is concern for small bowel obstruction likely secondary to adhesive disease  Plan  The patient has a complex surgical history and given he has so soon after his hepatic OJ I think it would be beneficial to reach out to his original surgeon for possible transfer.  If there are no bed availability or they denied the transfer he can be admitted to the hospital team.  Regardless of his disposition he needs an NG tube with decompression.  I  discussed with the patient and his 2 sisters that we will try to manage this nonoperatively with NG tube decompression.  If this does not work over the next 24 to 48 hours then we can attempt a Gastrografin challenge.  The last resort would be surgical intervention.  He needs aggressive fluid resuscitation    Kandis Cocking 11/30/2023, 8:12 PM

## 2023-11-30 NOTE — ED Provider Notes (Signed)
[facility] Provider Note    Event Date/Time   First MD Initiated Contact with Patient 11/30/23 1549     (approximate)   History   Chief Complaint: Spasms and Emesis   HPI  Wesley Francis is a 57 y.o. male with a history of alcohol abuse, hypertension, COPD who was brought to the ED due to muscle cramps and spasms that started last night, gradual onset, worsening.  Associated vomiting today.  Patient complains of cramping pain all over.  Endorses regular alcohol but "not that much."  Denies chest pain or shortness of breath but feels like he is having acid reflux in his upper abdomen.  EMS activated code STEMI based on prearrival EKG.  Patient seen on arrival by cardiology Dr. Kirke Corin who notes that the EKG looks unchanged from previous and symptoms are not overly concerning, so he canceled code STEMI.          Physical Exam   Triage Vital Signs: ED Triage Vitals  Encounter Vitals Group     BP 11/30/23 1548 (!) 134/114     Systolic BP Percentile --      Diastolic BP Percentile --      Pulse --      Resp 11/30/23 1548 18     Temp 11/30/23 1548 (!) 97.5 F (36.4 C)     Temp Source 11/30/23 1548 Oral     SpO2 11/30/23 1544 (!) 85 %     Weight --      Height --      Head Circumference --      Peak Flow --      Pain Score 11/30/23 1551 0     Pain Loc --      Pain Education --      Exclude from Growth Chart --     Most recent vital signs: Vitals:   11/30/23 1730 11/30/23 1930  BP: 120/87 111/85  Pulse: 92   Resp: (!) 21 (!) 21  Temp:    SpO2: 100%     General: Awake, no distress.  CV:  Good peripheral perfusion.  Regular rate and rhythm Resp:  Normal effort.  Clear to auscultation bilaterally Abd:  No distention.  Soft nontender Other:  Dry oral mucosa   ED Results / Procedures / Treatments   Labs (all labs ordered are listed, but only abnormal results are displayed) Labs Reviewed  COMPREHENSIVE METABOLIC PANEL - Abnormal;  Notable for the following components:      Result Value   Sodium 134 (*)    Chloride 84 (*)    Glucose, Bld 145 (*)    BUN 53 (*)    Creatinine, Ser 4.37 (*)    Calcium 10.6 (*)    Total Protein 10.1 (*)    Albumin 5.7 (*)    AST 44 (*)    GFR, Estimated 15 (*)    Anion gap 24 (*)    All other components within normal limits  CBC WITH DIFFERENTIAL/PLATELET - Abnormal; Notable for the following components:   WBC 15.6 (*)    RBC 6.68 (*)    MCV 75.3 (*)    MCH 24.3 (*)    Neutro Abs 13.4 (*)    Abs Immature Granulocytes 0.09 (*)    All other components within normal limits  CBG MONITORING, ED - Abnormal; Notable for the following components:   Glucose-Capillary 155 (*)    All other components within normal limits  TROPONIN I (HIGH SENSITIVITY) -  Abnormal; Notable for the following components:   Troponin I (High Sensitivity) 21 (*)    All other components within normal limits  TROPONIN I (HIGH SENSITIVITY) - Abnormal; Notable for the following components:   Troponin I (High Sensitivity) 24 (*)    All other components within normal limits  RESP PANEL BY RT-PCR (RSV, FLU A&B, COVID)  RVPGX2  LIPASE, BLOOD  CK  CBC  COMPREHENSIVE METABOLIC PANEL     EKG Interpreted by me Sinus rhythm, rate of 91.  Normal axis, normal intervals.  Poor R wave progression.  Normal ST segments and T waves.  3 PVCs on the strip.   RADIOLOGY Chest x-ray interpreted by me, unremarkable.  Radiology report reviewed   PROCEDURES:  Procedures   MEDICATIONS ORDERED IN ED: Medications  enoxaparin (LOVENOX) injection 40 mg (has no administration in time range)  ondansetron (ZOFRAN) tablet 4 mg (has no administration in time range)    Or  ondansetron (ZOFRAN) injection 4 mg (has no administration in time range)  0.9 %  sodium chloride infusion (has no administration in time range)  pantoprazole (PROTONIX) injection 40 mg (40 mg Intravenous Given 11/30/23 1654)  metoCLOPramide (REGLAN)  injection 10 mg (10 mg Intravenous Given 11/30/23 1655)  LORazepam (ATIVAN) injection 1 mg (1 mg Intravenous Given 11/30/23 1700)  lactated ringers bolus 1,000 mL (1,000 mLs Intravenous New Bag/Given 11/30/23 1715)     IMPRESSION / MDM / ASSESSMENT AND PLAN / ED COURSE  I reviewed the triage vital signs and the nursing notes.  DDx: Dehydration, gastritis, alcohol withdrawal, electrolyte abnormality, anemia, COVID, influenza, pneumonia,  Patient's presentation is most consistent with acute presentation with potential threat to life or bodily function.  Pt p/w generalized crampy muscle pain and "gerd". No specific CP or SOB. Will check labs, give IVF, trial of benzos for ?AWS   Clinical Course as of 11/30/23 2044  Tue Nov 30, 2023  1707 Creatinine(!): 4.37 AKI and leukocytosis. Will CT abd/pelvis. Will need to admit. Will start IVF.  [PS]    Clinical Course User Index [PS] Sharman Cheek, MD     ----------------------------------------- 8:44 PM on 11/30/2023 ----------------------------------------- Case discussed with general surgery, hospitalist, and [facility] surgical team.  [facility] surgery defers to patient preference regarding transfer versus admission at [facility].  Patient prefers to be at [facility].  FINAL CLINICAL IMPRESSION(S) / ED DIAGNOSES   Final diagnoses:  SBO (small bowel obstruction) (HCC)  AKI (acute kidney injury) (HCC)     Rx / DC Orders   ED Discharge Orders     None        Note:  This document was prepared using Dragon voice recognition software and may include unintentional dictation errors.   Sharman Cheek, MD 11/30/23 2044

## 2023-11-30 NOTE — ED Notes (Signed)
Rep. Sunny Schlein called back from [facility] with Dr. Fredonia Highland from surg team for consult with Dr. Scotty Court.

## 2023-12-01 ENCOUNTER — Encounter: Payer: Self-pay | Admitting: Family Medicine

## 2023-12-01 ENCOUNTER — Inpatient Hospital Stay: Payer: MEDICAID

## 2023-12-01 DIAGNOSIS — F1010 Alcohol abuse, uncomplicated: Secondary | ICD-10-CM | POA: Diagnosis not present

## 2023-12-01 DIAGNOSIS — N179 Acute kidney failure, unspecified: Secondary | ICD-10-CM | POA: Diagnosis not present

## 2023-12-01 DIAGNOSIS — K56609 Unspecified intestinal obstruction, unspecified as to partial versus complete obstruction: Secondary | ICD-10-CM | POA: Diagnosis not present

## 2023-12-01 LAB — COMPREHENSIVE METABOLIC PANEL
ALT: 23 U/L (ref 0–44)
AST: 36 U/L (ref 15–41)
Albumin: 4.1 g/dL (ref 3.5–5.0)
Alkaline Phosphatase: 35 U/L — ABNORMAL LOW (ref 38–126)
Anion gap: 17 — ABNORMAL HIGH (ref 5–15)
BUN: 62 mg/dL — ABNORMAL HIGH (ref 6–20)
CO2: 27 mmol/L (ref 22–32)
Calcium: 9.1 mg/dL (ref 8.9–10.3)
Chloride: 90 mmol/L — ABNORMAL LOW (ref 98–111)
Creatinine, Ser: 5.01 mg/dL — ABNORMAL HIGH (ref 0.61–1.24)
GFR, Estimated: 13 mL/min — ABNORMAL LOW (ref >60)
Glucose, Bld: 122 mg/dL — ABNORMAL HIGH (ref 70–99)
Potassium: 4.2 mmol/L (ref 3.5–5.1)
Sodium: 134 mmol/L — ABNORMAL LOW (ref 135–145)
Total Bilirubin: 1.3 mg/dL — ABNORMAL HIGH (ref <1.2)
Total Protein: 7.6 g/dL (ref 6.5–8.1)

## 2023-12-01 LAB — CBC
HCT: 42.5 % (ref 39.0–52.0)
Hemoglobin: 14.2 g/dL (ref 13.0–17.0)
MCH: 24.8 pg — ABNORMAL LOW (ref 26.0–34.0)
MCHC: 33.4 g/dL (ref 30.0–36.0)
MCV: 74.3 fL — ABNORMAL LOW (ref 80.0–100.0)
Platelets: 269 10*3/uL (ref 150–400)
RBC: 5.72 MIL/uL (ref 4.22–5.81)
RDW: 14.2 % (ref 11.5–15.5)
WBC: 17.1 10*3/uL — ABNORMAL HIGH (ref 4.0–10.5)
nRBC: 0 % (ref 0.0–0.2)

## 2023-12-01 LAB — TROPONIN I (HIGH SENSITIVITY)
Troponin I (High Sensitivity): 31 ng/L — ABNORMAL HIGH (ref <18)
Troponin I (High Sensitivity): 15 ng/L (ref <18)

## 2023-12-01 LAB — LACTIC ACID, PLASMA
Lactic Acid, Venous: 3.1 mmol/L (ref 0.5–1.9)
Lactic Acid, Venous: >9 mmol/L (ref 0.5–1.9)
Lactic Acid, Venous: 2.4 mmol/L (ref 0.5–1.9)
Lactic Acid, Venous: 2.1 mmol/L (ref 0.5–1.9)

## 2023-12-01 MED ORDER — DILTIAZEM HCL 25 MG/5ML IV SOLN
10.0000 mg | Freq: Once | INTRAVENOUS | Status: AC
  Administered 2023-12-01: 10 mg via INTRAVENOUS
  Filled 2023-12-01: qty 5

## 2023-12-01 MED ORDER — LACTATED RINGERS IV BOLUS
1000.0000 mL | Freq: Once | INTRAVENOUS | Status: AC
  Administered 2023-12-01: 1000 mL via INTRAVENOUS

## 2023-12-01 MED ORDER — PANTOPRAZOLE SODIUM 40 MG IV SOLR
40.0000 mg | Freq: Two times a day (BID) | INTRAVENOUS | Status: DC
  Administered 2023-12-01 – 2023-12-06 (×11): 40 mg via INTRAVENOUS
  Filled 2023-12-01 (×12): qty 10

## 2023-12-01 MED ORDER — PROCHLORPERAZINE EDISYLATE 10 MG/2ML IJ SOLN
5.0000 mg | Freq: Once | INTRAMUSCULAR | Status: AC
  Administered 2023-12-01: 5 mg via INTRAVENOUS
  Filled 2023-12-01: qty 2

## 2023-12-01 MED ORDER — PROCHLORPERAZINE EDISYLATE 10 MG/2ML IJ SOLN
10.0000 mg | Freq: Three times a day (TID) | INTRAMUSCULAR | Status: DC | PRN
  Administered 2023-12-01 – 2023-12-05 (×5): 10 mg via INTRAVENOUS
  Filled 2023-12-01 (×7): qty 2

## 2023-12-01 NOTE — ED Notes (Signed)
NG appears to not be functioning tubing checked all suctions replaced

## 2023-12-01 NOTE — Progress Notes (Signed)
MEWS Progress Note  Patient Details Name: MIN SARRA MRN: [id number] DOB: August 17, 1966 Today's Date: 12/01/2023   MEWS Flowsheet Documentation:  Assess: MEWS Score Temp: 98.2 F (36.8 C) BP: 123/78 MAP (mmHg): 92 Pulse Rate: (!) 103 ECG Heart Rate: (!) 125 Resp: 18 Level of Consciousness: Alert SpO2: 98 % O2 Device: Room Air Assess: MEWS Score MEWS Temp: 0 MEWS Systolic: 0 MEWS Pulse: 2 MEWS RR: 0 MEWS LOC: 0 MEWS Score: 2 MEWS Score Color: Yellow Assess: SIRS CRITERIA SIRS Temperature : 0 SIRS Respirations : 0 SIRS Pulse: 1 SIRS WBC: 0 SIRS Score Sum : 1 SIRS Temperature : 0 SIRS Pulse: 1 SIRS Respirations : 0 SIRS WBC: 0 SIRS Score Sum : 1 Assess: if the MEWS score is Yellow or Red Were vital signs accurate and taken at a resting state?: Yes Provider Notification Provider Name/Title: Modou NP Date Provider Notified: 12/01/23 Time Provider Notified: 2233 Method of Notification: Call Notification Reason: Change in status Provider response: See new orders Date of Provider Response: 12/01/23 Time of Provider Response: [id number]      Blair Promise A Hance Caspers 12/01/2023, 10:47 PM

## 2023-12-01 NOTE — Progress Notes (Signed)
       CROSS COVER NOTE  NAME: THAI MACKILLOP MRN: [id number] DOB : Sep 25, 1966    Date of Service   12/01/2023   HPI/Events of Note   Nurse paged for lactic acid of 3.1.  Patient was given a liter of LR.  Chart review shows that patient has been treated for an SBO with NG tube decompression.  Nurse reported that patient had output from the tube of greater than 1000 mL.  Patient reports no new acute change.  Lactic acid was trended and repeated value reported at greater than 9 but there was concern for possible lab error but patient was given another liter of LR as surgery recommended aggressive fluid resuscitation for him.  Patient's examination did not change not worsening abdominal pain to suggest possible peritonitis or other causes of acute abdomen.  Patient's blood pressure also remained stable.  Interventions   -Paged Dr. Maurine Minister on-call for surgery and talk to him about the patient and he recommended repeating the labs and examined the patient again to make sure he is stable.  Patient's repeat lactic acid came back at 2.4.  Patient placed on maintenance fluids.  Patient's sister is at bedside and he was updated. -Kindly review and see if patient qualifies to have care level changed to PCU; patient currently MedSurg.       Jaclin Finks Lamin Geradine Girt, MSN, APRN, AGACNP-BC [facility] [place] Pager: [phone]. Check Amion for Availability

## 2023-12-01 NOTE — ED Notes (Signed)
NP notified via secure chat of elevated lactic result and NG output amount with color

## 2023-12-01 NOTE — ED Notes (Signed)
Pt given tube of mouth moisturizer. Pt NAD, no pain at this time.

## 2023-12-01 NOTE — ED Notes (Signed)
CCMD contacts Presenter, broadcasting to report possible arrhythmia rate 85 will advise on call for [facility]

## 2023-12-01 NOTE — Progress Notes (Signed)
CC: SBO Subjective: Patient admitted overnight secondary to small bowel obstruction.  He is a approximately 5 weeks status post robotic cholecystectomy with common bile duct exploration and hepaticojejunostomy.  On CT scan it looks like his obstruction is distal to the JJ anastomosis.  He had an NG tube placed with almost 2 L of output in just 12 hours.  He did have a lactic acidosis overnight but I think this was lab error.  This morning he reports that his abdominal pain is much improved and he feels less bloated.  He is asking for something to drink.  He has not had any flatus  Objective: Vital signs in last 24 hours: Temp:  [97.5 F (36.4 C)-98.3 F (36.8 C)] 98.3 F (36.8 C) (12/25 0647) Pulse Rate:  [80-92] 88 (12/25 0830) Resp:  [15-31] 21 (12/25 0830) BP: (98-136)/(65-114) 119/83 (12/25 0830) SpO2:  [85 %-100 %] 100 % (12/25 0830)    Intake/Output from previous day: 12/24 0701 - 12/25 0700 In: 5000 [I.V.:1000; IV Piggyback:4000] Out: 2500 [Emesis/NG output:1300] Intake/Output this shift: No intake/output data recorded.  Physical exam:  NG tube in place with bilious output.  Abdomen is still distended with tympany although it is softer than yesterday.  Robotic port sites are healing well without any evidence of infection.  Lab Results: CBC  Recent Labs    11/30/23 1550 12/01/23 0504  WBC 15.6* 17.1*  HGB 16.2 14.2  HCT 50.3 42.5  PLT 319 269   BMET Recent Labs    11/30/23 1550 12/01/23 0504  NA 134* 134*  K 4.4 4.2  CL 84* 90*  CO2 26 27  GLUCOSE 145* 122*  BUN 53* 62*  CREATININE 4.37* 5.01*  CALCIUM 10.6* 9.1   PT/INR No results for input(s): "LABPROT", "INR" in the last 72 hours. ABG No results for input(s): "PHART", "HCO3" in the last 72 hours.  Invalid input(s): "PCO2", "PO2"  Studies/Results: CT ABDOMEN PELVIS WO CONTRAST Result Date: 11/30/2023 CLINICAL DATA:  Abdominal pain and vomiting for 3 days. EXAM: CT ABDOMEN AND PELVIS WITHOUT  CONTRAST TECHNIQUE: Multidetector CT imaging of the abdomen and pelvis was performed following the standard protocol without IV contrast. RADIATION DOSE REDUCTION: This exam was performed according to the departmental dose-optimization program which includes automated exposure control, adjustment of the mA and/or kV according to patient size and/or use of iterative reconstruction technique. COMPARISON:  06/27/2023 FINDINGS: Lower chest: No acute findings. Hepatobiliary: No mass visualized on this unenhanced exam. Pneumobilia is seen, consistent with prior sphincterotomy. Pancreas: Embolization coils again seen adjacent to the pancreatic head. No mass or inflammatory process visualized on this unenhanced exam. Spleen:  Within normal limits in size. Adrenals/Urinary tract: No evidence of urolithiasis or hydronephrosis. Unremarkable unopacified urinary bladder. Stomach/Bowel: Stomach and proximal small bowel loops are dilated. Surgical staples seen in the proximal small bowel in the right abdomen, with possible transition point noted just distally in the mid abdomen. This raises suspicion for small-bowel obstruction, possibly due to adhesion. No evidence of inflammatory process, or abnormal fluid collections. Vascular/Lymphatic: No pathologically enlarged lymph nodes identified. No evidence of abdominal aortic aneurysm. Reproductive:  No mass or other significant abnormality. Other:  None. Musculoskeletal:  No suspicious bone lesions identified. IMPRESSION: Dilated stomach and proximal small bowel, with possible transition point in the mid abdomen. This raises suspicion for small-bowel obstruction, possibly due to adhesion. No mass or inflammatory process identified. Electronically Signed   By: Danae Orleans M.D.   On: 11/30/2023 18:08   DG  Chest Portable 1 View Result Date: 11/30/2023 CLINICAL DATA:  Chest pain, vomiting EXAM: PORTABLE CHEST 1 VIEW COMPARISON:  02/15/2022 FINDINGS: Defibrillator pads and other  leads overlying the chest. The heart size and mediastinal contours are within normal limits. Both lungs are clear. The visualized skeletal structures are unremarkable. IMPRESSION: No acute abnormality of the lungs in AP portable projection. Electronically Signed   By: Jearld Lesch M.D.   On: 11/30/2023 16:49    Anti-infectives: Anti-infectives (From admission, onward)    None       Assessment/Plan:  The patient is a 57 year old who recently had a robotic assisted cholecystectomy with common bile duct exploration and hepaticojejunostomy.  He presented with several day history of nausea and vomiting and a CT scan concerning for small bowel obstruction.  He also has a acute kidney injury with worsening creatinine today.  He had a lactic acidosis yesterday but that has been downtrending in the value of 9 was likely a lab error.  At this time he continues to need aggressive fluid resuscitation as he reports that he has not been urinating and he has worse kidney function.  I would also engage nephrology team to ensure that there are no other etiologies of his AKI.  Given he still has bilious output would continue NG tube decompression.  If this does not improve over the next 48 to 72 hours then may need a Gastrografin challenge.  He did have a worsening leukocytosis but will observe that for now  Baker Pierini, M.D. [facility]

## 2023-12-01 NOTE — ED Notes (Signed)
Ng equipment continues to work well all critical information continues to be communicated to NP hospitalist

## 2023-12-01 NOTE — Progress Notes (Signed)
       CROSS COVER NOTE  NAME: Wesley Francis MRN: [id number] DOB : 1966/11/19    Date of Service   12/01/2023   HPI/Events of Note   New A-fib RVR noted by care nurse on the monitor with rates about 150.  Chart review showed that patient had an episode of A-fib back in July when he was hospitalized but does not seem to be on any daily medication.  Patient is alert and oriented and denies feeling of palpitations or chest pain.  Patient currently being treated conservatively for an SBO with NG tube decompression.  Interventions   -Patient given 20 mg of diltiazem IV push.  Rate slowed down a bit but still about 130 bpm Diltiazem drip ordered.  Patient moved to progressive care unit. -Attempted to reach on-call cardiologist to no avail.  Kindly follow in the morning.       Ninette Cotta Lamin Geradine Girt, MSN, APRN, AGACNP-BC [facility] [place] Pager: [phone]. Check Amion for Availability

## 2023-12-01 NOTE — ED Notes (Signed)
Patient placed on low intermittent wall suction. Bile-colored fluid noted in tubing. Family at bedside. Patient's mouth was swabbed with ice water per request.

## 2023-12-01 NOTE — Hospital Course (Addendum)
Wesley Francis is a 57 y.o. 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  12/29.  Patient developed atrial fibrillation with RVR.  Started on diltiazem drip, given digoxin and metoprolol.  PICC line placed for TPN. 12/30.  Heart rate better controlled on amnio drip.  Repeated KUB did not show any improvement of SBO, transfer to [facility].

## 2023-12-01 NOTE — ED Notes (Signed)
EKG complete Presenter, broadcasting notifies Dr Alvester Morin and Dr Elesa Massed Charge RN notified

## 2023-12-01 NOTE — Consult Note (Signed)
[facility] CONSULT NOTE    Date: 12/01/2023                  Patient Name:  Wesley Francis  MRN: [id number]  DOB: Apr 18, 1966  Age / Sex: 57 y.o., male         PCP: Fanny Dance, FNP                 Service Requesting Consult: Hospitalist                 Reason for Consult: Acute kidney injury            History of Present Illness: Patient is a 57 y.o. male with a PMHx of alcohol abuse, hypertension, history of complicated choledocholithiasis including choledocho duodenal fistula, Roux-en-Y hepaticojejunostomy, cholecystectomy 10/20/2023, prior duodenal ulcer requiring coiling, who was admitted to [facility] on 11/30/2023 for evaluation of nausea, vomiting, abdominal pain as a result of small bowel obstruction.  We are now asked to see him for evaluation management of acute kidney injury.  His baseline creatinine is 1.17.  Presenting creatinine was 4.37 and creatinine currently up to 5.01.  Patient did not appear to be on any obvious nephrotoxins as an outpatient.   Medications: Outpatient medications: Medications Prior to Admission  Medication Sig Dispense Refill Last Dose/Taking   amLODipine (NORVASC) 5 MG tablet Take 5 mg by mouth daily.   Past Week   Cholecalciferol (VITAMIN D3) 50 MCG (2000 UT) capsule Take by mouth.   Past Week   Cholecalciferol 1.25 MG (50000 UT) TABS Take 1 capsule by mouth every other day.   Past Week   folic acid (FOLVITE) 1 MG tablet Take 1 mg by mouth every morning.   Past Week   pantoprazole (PROTONIX) 40 MG tablet Take 40 mg by mouth 2 (two) times daily.   Past Week   albuterol (PROVENTIL) (2.5 MG/3ML) 0.083% nebulizer solution Take 3 mLs (2.5 mg total) by nebulization every 6 (six) hours as needed for wheezing or shortness of breath. (Patient not taking: Reported on 08/12/2023)   Not Taking    Current medications: Current Facility-Administered Medications  Medication Dose Route Frequency Provider Last Rate Last Admin   0.9 %   sodium chloride infusion   Intravenous Continuous Marrion Coy, MD 125 mL/hr at 12/01/23 0742 Rate Verify at 12/01/23 0742   enoxaparin (LOVENOX) injection 30 mg  30 mg Subcutaneous Q24H Floydene Flock, MD   30 mg at 11/30/23 2322   ondansetron (ZOFRAN) tablet 4 mg  4 mg Oral Q6H PRN Floydene Flock, MD       Or   ondansetron [facility]) injection 4 mg  4 mg Intravenous Q6H PRN Floydene Flock, MD   4 mg at 11/30/23 2229   pantoprazole (PROTONIX) injection 40 mg  40 mg Intravenous Gaynelle Cage, MD   40 mg at 12/01/23 1357      Allergies: No Known Allergies    Past Medical History: Past Medical History:  Diagnosis Date   AKI (acute kidney injury) (HCC) 04/04/2016   Anemia    Arthritis    Asthma    ETOH abuse    Folate deficiency 04/05/2016   Heme positive stool 04/05/2016   Hypertension      Past Surgical History: Past Surgical History:  Procedure Laterality Date   BIOPSY  03/10/2022   Procedure: BIOPSY;  Surgeon: Dolores Frame, MD;  [facility];  Service: Gastroenterology;;   COLONOSCOPY  COLONOSCOPY WITH PROPOFOL N/A 03/02/2017   Procedure: COLONOSCOPY WITH PROPOFOL;  Surgeon: Christena Deem, MD;  Location: [facility] ENDOSCOPY;  Service: Endoscopy;  Laterality: N/A;   COLONOSCOPY WITH PROPOFOL N/A 07/08/2020   Procedure: COLONOSCOPY WITH PROPOFOL;  Surgeon: Regis Bill, MD;  Location: [facility] ENDOSCOPY;  Service: Endoscopy;  Laterality: N/A;   ESOPHAGOGASTRODUODENOSCOPY (EGD) WITH PROPOFOL N/A 07/08/2020   Procedure: ESOPHAGOGASTRODUODENOSCOPY (EGD) WITH PROPOFOL;  Surgeon: Regis Bill, MD;  Location: [facility] ENDOSCOPY;  Service: Endoscopy;  Laterality: N/A;   ESOPHAGOGASTRODUODENOSCOPY (EGD) WITH PROPOFOL N/A 03/10/2022   Procedure: ESOPHAGOGASTRODUODENOSCOPY (EGD) WITH PROPOFOL;  Surgeon: Dolores Frame, MD;  Location: [facility];  Service: Gastroenterology;  Laterality: N/A;   ESOPHAGOGASTRODUODENOSCOPY (EGD) WITH PROPOFOL N/A  06/22/2022   Procedure: ESOPHAGOGASTRODUODENOSCOPY (EGD) WITH PROPOFOL;  Surgeon: Lanelle Bal, DO;  Location: [facility];  Service: Endoscopy;  Laterality: N/A;  11:45am   FRACTURE SURGERY     IR ANGIOGRAM FOLLOW UP STUDY  03/15/2022   IR ANGIOGRAM SELECTIVE EACH ADDITIONAL VESSEL  03/15/2022   IR ANGIOGRAM VISCERAL SELECTIVE  03/15/2022   IR ANGIOGRAM VISCERAL SELECTIVE  03/15/2022   IR EMBO ART  VEN HEMORR LYMPH EXTRAV  INC GUIDE ROADMAPPING  03/15/2022   IR US GUIDE VASC ACCESS RIGHT  03/15/2022   KNEE ARTHROCENTESIS       Family History: Family History  Problem Relation Age of Onset   Hypertension Mother    Stroke Mother    Hypertension Father    Colon cancer Father        54s   Colon polyps Sister        1s   Colon cancer Paternal Uncle        42s   Colon cancer Paternal Uncle        17s   Colon cancer Paternal Uncle        62s     Social History: Social History   Socioeconomic History   Marital status: Single    Spouse name: Not on file   Number of children: Not on file   Years of education: Not on file   Highest education level: Not on file  Occupational History   Not on file  Tobacco Use   Smoking status: Every Day    Current packs/day: 0.50    Types: Cigarettes    Passive exposure: Current   Smokeless tobacco: Never  Vaping Use   Vaping status: Never Used  Substance and Sexual Activity   Alcohol use: Not Currently    Comment: 24 oz beer daily   Drug use: No   Sexual activity: Not on file  Other Topics Concern   Not on file  Social History Narrative   Not on file   Social Drivers of Health   Financial Resource Strain: Not on file  Food Insecurity: No Food Insecurity (12/01/2023)   Hunger Vital Sign    Worried About Running Out of Food in the Last Year: Never true    Ran Out of Food in the Last Year: Never true  Transportation Needs: No Transportation Needs (12/01/2023)   PRAPARE - Administrator, Civil Service (Medical): No     Lack of Transportation (Non-Medical): No  Physical Activity: Not on file  Stress: Not on file  Social Connections: Not on file  Intimate Partner Violence: Not At Risk (12/01/2023)   Humiliation, Afraid, Rape, and Kick questionnaire    Fear of Current or Ex-Partner: No    Emotionally Abused:  No    Physically Abused: No    Sexually Abused: No     Review of Systems: Review of Systems  Constitutional:  Negative for chills, fever and malaise/fatigue.  HENT:  Negative for congestion, hearing loss and tinnitus.   Eyes:  Negative for blurred vision and double vision.  Respiratory:  Negative for cough, sputum production and shortness of breath.   Cardiovascular:  Negative for chest pain, palpitations and orthopnea.  Gastrointestinal:  Positive for abdominal pain, nausea and vomiting. Negative for diarrhea.  Genitourinary:  Negative for dysuria, frequency and urgency.  Musculoskeletal:  Negative for myalgias.  Skin:  Negative for itching and rash.  Neurological:  Negative for dizziness and focal weakness.  Endo/Heme/Allergies:  Negative for polydipsia. Does not bruise/bleed easily.  Psychiatric/Behavioral:  The patient is not nervous/anxious.      Vital Signs: Blood pressure 128/82, pulse 87, temperature 99.3 F (37.4 C), temperature source Oral, resp. rate 18, height 5\' 6"  (1.676 m), weight 70.3 kg, SpO2 97%.  Weight trends: Filed Weights   12/01/23 1502  Weight: 70.3 kg     Physical Exam: General: No acute distress  Head: Normocephalic, atraumatic. Moist oral mucosal membranes  Eyes: Anicteric  Neck: Supple  Lungs:  Clear to auscultation, normal effort  Heart: S1S2 no rubs  Abdomen:  Mild distention  Extremities: No peripheral edema.  Neurologic: Awake, alert, following commands  Skin: No acute rash  Access: No hemodialysis access    Lab results: Basic Metabolic Panel: Recent Labs  Lab 11/30/23 1550 12/01/23 0504  NA 134* 134*  K 4.4 4.2  CL 84* 90*  CO2 26 27   GLUCOSE 145* 122*  BUN 53* 62*  CREATININE 4.37* 5.01*  CALCIUM 10.6* 9.1    Liver Function Tests: Recent Labs  Lab 11/30/23 1550 12/01/23 0504  AST 44* 36  ALT 20 23  ALKPHOS 48 35*  BILITOT 1.1 1.3*  PROT 10.1* 7.6  ALBUMIN 5.7* 4.1   Recent Labs  Lab 11/30/23 1550  LIPASE 30   No results for input(s): "AMMONIA" in the last 168 hours.  CBC: Recent Labs  Lab 11/30/23 1550 12/01/23 0504  WBC 15.6* 17.1*  NEUTROABS 13.4*  --   HGB 16.2 14.2  HCT 50.3 42.5  MCV 75.3* 74.3*  PLT 319 269    Cardiac Enzymes: Recent Labs  Lab 11/30/23 1550  CKTOTAL 305    BNP: Invalid input(s): "POCBNP"  CBG: Recent Labs  Lab 11/30/23 1551  GLUCAP 155*    Microbiology: Results for orders placed or performed during the hospital encounter of 11/30/23  Resp panel by RT-PCR (RSV, Flu A&B, Covid) Anterior Nasal Swab     Status: None   Collection Time: 11/30/23  3:57 PM   Specimen: Anterior Nasal Swab  Result Value Ref Range Status   SARS Coronavirus 2 by RT PCR NEGATIVE NEGATIVE Final    Comment: (NOTE) SARS-CoV-2 target nucleic acids are NOT DETECTED.  The SARS-CoV-2 RNA is generally detectable in upper respiratory specimens during the acute phase of infection. The lowest concentration of SARS-CoV-2 viral copies this assay can detect is 138 copies/mL. A negative result does not preclude SARS-Cov-2 infection and should not be used as the sole basis for treatment or other patient management decisions. A negative result may occur with  improper specimen collection/handling, submission of specimen other than nasopharyngeal swab, presence of viral mutation(s) within the areas targeted by this assay, and inadequate number of viral copies(<138 copies/mL). A negative result must be combined with  clinical observations, patient history, and epidemiological information. The expected result is Negative.  Fact Sheet for Patients:   BloggerCourse.com  Fact Sheet for Healthcare Providers:  SeriousBroker.it  This test is no t yet approved or cleared by the [place] FDA and  has been authorized for detection and/or diagnosis of SARS-CoV-2 by FDA under an Emergency Use Authorization (EUA). This EUA will remain  in effect (meaning this test can be used) for the duration of the COVID-19 declaration under Section 564(b)(1) of the Act, 21 U.S.C.section 360bbb-3(b)(1), unless the authorization is terminated  or revoked sooner.       Influenza A by PCR NEGATIVE NEGATIVE Final   Influenza B by PCR NEGATIVE NEGATIVE Final    Comment: (NOTE) The Xpert Xpress SARS-CoV-2/FLU/RSV plus assay is intended as an aid in the diagnosis of influenza from Nasopharyngeal swab specimens and should not be used as a sole basis for treatment. Nasal washings and aspirates are unacceptable for Xpert Xpress SARS-CoV-2/FLU/RSV testing.  Fact Sheet for Patients: BloggerCourse.com  Fact Sheet for Healthcare Providers: SeriousBroker.it  This test is not yet approved or cleared by the [place] FDA and has been authorized for detection and/or diagnosis of SARS-CoV-2 by FDA under an Emergency Use Authorization (EUA). This EUA will remain in effect (meaning this test can be used) for the duration of the COVID-19 declaration under Section 564(b)(1) of the Act, 21 U.S.C. section 360bbb-3(b)(1), unless the authorization is terminated or revoked.     Resp Syncytial Virus by PCR NEGATIVE NEGATIVE Final    Comment: (NOTE) Fact Sheet for Patients: BloggerCourse.com  Fact Sheet for Healthcare Providers: SeriousBroker.it  This test is not yet approved or cleared by the [place] FDA and has been authorized for detection and/or diagnosis of SARS-CoV-2 by FDA under an Emergency Use  Authorization (EUA). This EUA will remain in effect (meaning this test can be used) for the duration of the COVID-19 declaration under Section 564(b)(1) of the Act, 21 U.S.C. section 360bbb-3(b)(1), unless the authorization is terminated or revoked.  Performed at [facility], [address] [place], [place] [address]     Coagulation Studies: No results for input(s): "LABPROT", "INR" in the last 72 hours.  Urinalysis: No results for input(s): "COLORURINE", "LABSPEC", "PHURINE", "GLUCOSEU", "HGBUR", "BILIRUBINUR", "KETONESUR", "PROTEINUR", "UROBILINOGEN", "NITRITE", "LEUKOCYTESUR" in the last 72 hours.  Invalid input(s): "APPERANCEUR"    Imaging: US RENAL Result Date: 12/01/2023 CLINICAL DATA:  57 year old male with acute renal insufficiency. EXAM: RENAL / URINARY TRACT ULTRASOUND COMPLETE COMPARISON:  CT Abdomen and Pelvis yesterday. FINDINGS: Right Kidney: Renal measurements: 8.6 x 3.7 x 5.0 cm = volume: 82 mL. Maintained cortical echogenicity. No solid right renal mass or hydronephrosis. Left Kidney: Renal measurements: 8.4 x 4.2 x 4.3 cm = volume: 80 mL. Maintained cortical echogenicity. No solid left renal mass or hydronephrosis. Bladder: Appears normal for degree of bladder distention. Other: Estimated prostate volume 12 mL. IMPRESSION: Bilateral renal volume loss, but otherwise negative ultrasound appearance of the kidneys and bladder. Electronically Signed   By: Odessa Fleming M.D.   On: 12/01/2023 10:15   CT ABDOMEN PELVIS WO CONTRAST Result Date: 11/30/2023 CLINICAL DATA:  Abdominal pain and vomiting for 3 days. EXAM: CT ABDOMEN AND PELVIS WITHOUT CONTRAST TECHNIQUE: Multidetector CT imaging of the abdomen and pelvis was performed following the standard protocol without IV contrast. RADIATION DOSE REDUCTION: This exam was performed according to the departmental dose-optimization program which includes automated exposure control, adjustment of the mA and/or kV according to  patient size and/or use of iterative reconstruction technique. COMPARISON:  06/27/2023 FINDINGS: Lower chest: No acute findings. Hepatobiliary: No mass visualized on this unenhanced exam. Pneumobilia is seen, consistent with prior sphincterotomy. Pancreas: Embolization coils again seen adjacent to the pancreatic head. No mass or inflammatory process visualized on this unenhanced exam. Spleen:  Within normal limits in size. Adrenals/Urinary tract: No evidence of urolithiasis or hydronephrosis. Unremarkable unopacified urinary bladder. Stomach/Bowel: Stomach and proximal small bowel loops are dilated. Surgical staples seen in the proximal small bowel in the right abdomen, with possible transition point noted just distally in the mid abdomen. This raises suspicion for small-bowel obstruction, possibly due to adhesion. No evidence of inflammatory process, or abnormal fluid collections. Vascular/Lymphatic: No pathologically enlarged lymph nodes identified. No evidence of abdominal aortic aneurysm. Reproductive:  No mass or other significant abnormality. Other:  None. Musculoskeletal:  No suspicious bone lesions identified. IMPRESSION: Dilated stomach and proximal small bowel, with possible transition point in the mid abdomen. This raises suspicion for small-bowel obstruction, possibly due to adhesion. No mass or inflammatory process identified. Electronically Signed   By: Danae Orleans M.D.   On: 11/30/2023 18:08   DG Chest Portable 1 View Result Date: 11/30/2023 CLINICAL DATA:  Chest pain, vomiting EXAM: PORTABLE CHEST 1 VIEW COMPARISON:  02/15/2022 FINDINGS: Defibrillator pads and other leads overlying the chest. The heart size and mediastinal contours are within normal limits. Both lungs are clear. The visualized skeletal structures are unremarkable. IMPRESSION: No acute abnormality of the lungs in AP portable projection. Electronically Signed   By: Jearld Lesch M.D.   On: 11/30/2023 16:49     Assessment &  Plan: Pt is a 57 y.o. male with a PMHx of alcohol abuse, hypertension, history of complicated choledocholithiasis including choledocho duodenal fistula, Roux-en-Y hepaticojejunostomy, cholecystectomy 10/20/2023, prior duodenal ulcer requiring coiling, who was admitted to [facility] on 11/30/2023 for evaluation of nausea, vomiting, abdominal pain as a result of small bowel obstruction.  1.  Acute kidney injury likely secondary to dehydration from small bowel obstruction.  Baseline creatinine noted to be 1.17.  Presenting creatinine 4.37.  Current creatinine up to 5.01.  Continue IV fluid hydration with normal saline for now.  No immediate need for dialysis however this was discussed as a possible treatment option if renal function continues to deteriorate.  Avoid additional nephrotoxins as possible.  2.  Mild hyponatremia.  Serum sodium 134.  Likely related to bowel obstruction.  Continue hydration with 0.9 normal saline.  3.  Small bowel obstruction.  Currently has NG tube in place.  Management as per hospitalist and surgery.  4.  Thank for consultation.

## 2023-12-01 NOTE — ED Notes (Signed)
Family asking for water for the pt stating he is thirsty, family told pt is NPO at this time per Dr. Chipper Herb.

## 2023-12-01 NOTE — Progress Notes (Signed)
Progress Note   Patient: Wesley Francis:[id number] DOB: 1966-02-18 DOA: 11/30/2023     1 DOS: the patient was seen and examined on 12/01/2023   Brief hospital course: Wesley Francis is a 57 y.o. 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   Continue IV fluids.  Lactic acidosis. Initial lactic acid level was 3.1, followed by 9.0, then dropped back to 2.4.  9.0 is not consistent with clinical picture, most likely due to lab error. Patient currently does not have any abdominal rebound tenderness, abdominal pain seems to be better.  Not consistent with bowel ischemia.  Acute kidney injury. Hyponatremia. Patient prior renal function was normal, creatinine went up to 5.01 today.  Renal ultrasound  did not show any obstruction, but did show evidence of chronic kidney disease. I have consulted nephrology, continue IV fluids.  Alcohol abuse Tobacco abuse Continue CIWA protocol, continue nicotine patch.  Asthma, chronic Stable.   Elevated troponin Appear to be due to acute renal failure.  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     Subjective:  Patient abdominal and seems to be better.  No nausea vomiting.  Physical Exam: Vitals:   12/01/23 0830 12/01/23 1030 12/01/23 1100 12/01/23 1145  BP: 119/83 125/79 119/89 127/87  Pulse: 88 89 85 85  Resp: (!) 21 20 19 16   Temp:    98.1 F (36.7 C)  TempSrc:    Oral  SpO2: 100% 100% 100% 100%   General exam: Appears calm and comfortable  Respiratory system: Clear to auscultation. Respiratory effort normal. Cardiovascular system: S1 & S2 heard, RRR. No JVD, murmurs, rubs, gallops or clicks. No pedal edema. Gastrointestinal system: Abdomen is nondistended, soft and Upper abd tender, no rebound tenderness. No organomegaly or masses felt. Normal bowel sounds heard. Central nervous system: Alert and oriented. No focal neurological deficits. Extremities: Symmetric 5 x 5 power. Skin: No rashes, lesions or ulcers  Psychiatry: Judgement and insight appear normal. Mood & affect appropriate.    Data Reviewed:  Reviewed CT scan results, ultrasound results and  lab results.  Family Communication: Sister updated at bedside.  Disposition: Status is: Inpatient Remains inpatient appropriate because: Severity of disease, IV treatment.     Time spent: 50 minutes  Author: Marrion Coy, MD 12/01/2023 12:50 PM  For on call review [email or web address].

## 2023-12-01 NOTE — ED Notes (Signed)
Ng tube continues to appear to not be suctioning properly hardware and tubing is completely changed out with some improvement

## 2023-12-01 NOTE — ED Notes (Signed)
Approx output from ng @ this time dark brown green liquid pt aox4 provided blanket request water pt remains NPO family provided recliner and soda VSS 20g piv to RAC cdi patent secured not infiltrated @ this time flushed with normal saline 10ml without difficulty NG remains secured to rt nare @ 55cc with securement device wall suction remains apparently functional

## 2023-12-02 ENCOUNTER — Inpatient Hospital Stay: Payer: MEDICAID

## 2023-12-02 DIAGNOSIS — J449 Chronic obstructive pulmonary disease, unspecified: Secondary | ICD-10-CM | POA: Diagnosis not present

## 2023-12-02 DIAGNOSIS — K56609 Unspecified intestinal obstruction, unspecified as to partial versus complete obstruction: Secondary | ICD-10-CM | POA: Diagnosis not present

## 2023-12-02 DIAGNOSIS — N179 Acute kidney failure, unspecified: Secondary | ICD-10-CM | POA: Diagnosis not present

## 2023-12-02 LAB — CBC
HCT: 39 % (ref 39.0–52.0)
Hemoglobin: 12.9 g/dL — ABNORMAL LOW (ref 13.0–17.0)
MCH: 24.3 pg — ABNORMAL LOW (ref 26.0–34.0)
MCHC: 33.1 g/dL (ref 30.0–36.0)
MCV: 73.6 fL — ABNORMAL LOW (ref 80.0–100.0)
Platelets: 277 10*3/uL (ref 150–400)
RBC: 5.3 MIL/uL (ref 4.22–5.81)
RDW: 14.1 % (ref 11.5–15.5)
WBC: 13.7 10*3/uL — ABNORMAL HIGH (ref 4.0–10.5)
nRBC: 0 % (ref 0.0–0.2)

## 2023-12-02 LAB — BASIC METABOLIC PANEL
Anion gap: 13 (ref 5–15)
BUN: 62 mg/dL — ABNORMAL HIGH (ref 6–20)
CO2: 26 mmol/L (ref 22–32)
Calcium: 8.7 mg/dL — ABNORMAL LOW (ref 8.9–10.3)
Chloride: 94 mmol/L — ABNORMAL LOW (ref 98–111)
Creatinine, Ser: 2.94 mg/dL — ABNORMAL HIGH (ref 0.61–1.24)
GFR, Estimated: 24 mL/min — ABNORMAL LOW (ref >60)
Glucose, Bld: 95 mg/dL (ref 70–99)
Potassium: 3.2 mmol/L — ABNORMAL LOW (ref 3.5–5.1)
Sodium: 133 mmol/L — ABNORMAL LOW (ref 135–145)

## 2023-12-02 LAB — PHOSPHORUS: Phosphorus: 3.8 mg/dL (ref 2.5–4.6)

## 2023-12-02 MED ORDER — DILTIAZEM HCL-DEXTROSE 125-5 MG/125ML-% IV SOLN (PREMIX)
5.0000 mg/h | INTRAVENOUS | Status: DC
Start: 2023-12-02 — End: 2023-12-04
  Administered 2023-12-02 – 2023-12-03 (×3): 5 mg/h via INTRAVENOUS
  Filled 2023-12-02 (×3): qty 125

## 2023-12-02 MED ORDER — DIATRIZOATE MEGLUMINE & SODIUM 66-10 % PO SOLN
90.0000 mL | Freq: Once | ORAL | Status: AC
  Administered 2023-12-02: 90 mL via NASOGASTRIC

## 2023-12-02 MED ORDER — POTASSIUM CHLORIDE 10 MEQ/100ML IV SOLN
10.0000 meq | INTRAVENOUS | Status: AC
  Administered 2023-12-02 (×2): 10 meq via INTRAVENOUS
  Filled 2023-12-02 (×2): qty 100

## 2023-12-02 NOTE — Plan of Care (Signed)
  Problem: Education: Goal: Knowledge of General Education information will improve Description: Including pain rating scale, medication(s)/side effects and non-pharmacologic comfort measures Outcome: Progressing   Problem: Health Behavior/Discharge Planning: Goal: Ability to manage health-related needs will improve Outcome: Progressing   Problem: Clinical Measurements: Goal: Ability to maintain clinical measurements within normal limits will improve Outcome: Progressing Goal: Will remain free from infection Outcome: Progressing Goal: Diagnostic test results will improve Outcome: Progressing Goal: Respiratory complications will improve Outcome: Progressing Goal: Cardiovascular complication will be avoided Outcome: Progressing   Problem: Activity: Goal: Risk for activity intolerance will decrease Outcome: Progressing   Problem: Coping: Goal: Level of anxiety will decrease Outcome: Progressing   Problem: Pain Management: Goal: General experience of comfort will improve Outcome: Progressing

## 2023-12-02 NOTE — Progress Notes (Signed)
Progress Note   Patient: Wesley Francis YNW:[id number] DOB: 11/21/1966 DOA: 11/30/2023     2 DOS: the patient was seen and examined on 12/02/2023   Brief hospital course: Wesley Francis is a 57 y.o. 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 Repeated KUB did not show any improvement, patient is followed by general surgery, ordered a Gastrografin study. Continue IV fluids and NG suction.  Lactic acidosis. Initial lactic acid level was 3.1, followed by 9.0, then dropped back to 2.4.  9.0 is not consistent with clinical picture, most likely due to lab error. Patient currently does not have any abdominal rebound tenderness, abdominal pain seems to be better.  Not consistent with bowel ischemia.   Acute kidney  injury. Hyponatremia. Hypokalemia. Patient prior renal function was normal, creatinine went up to 5.01 today.  Renal ultrasound did not show any obstruction, but did show evidence of chronic kidney disease. Appreciate nephrology consult, renal function starting to be improving, replete potassium.   Alcohol abuse Tobacco abuse Continue CIWA protocol, continue nicotine patch.   Asthma, chronic Stable.     Elevated troponin Appear to be due to acute renal failure.   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     Subjective:  Patient denies any abdominal pain or nausea vomiting.  However, patient has not passed gas or had a bowel movement.  Physical Exam: Vitals:   12/02/23 0600 12/02/23 0630 12/02/23 0800 12/02/23 0900  BP: 117/82 128/85 117/73 125/81  Pulse: 80 90 78 73  Resp: (!) 22 14 17 16   Temp:    98.1 F (36.7 C)  TempSrc:    Oral  SpO2: 97% 98% 97% 98%  Weight:      Height:       General exam: Appears calm and comfortable  Respiratory system: Clear to auscultation. Respiratory effort normal. Cardiovascular system: S1 & S2 heard, RRR. No JVD, murmurs, rubs, gallops  or clicks. No pedal edema. Gastrointestinal system: Abdomen is nondistended, soft and nontender. No organomegaly or masses felt. Normal bowel sounds heard. Central nervous system: Alert and oriented. No focal neurological  deficits. Extremities: Symmetric 5 x 5 power. Skin: No rashes, lesions or ulcers Psychiatry: Judgement and insight appear normal. Mood & affect appropriate.    Data Reviewed:  X-ray and lab results reviewed.  Family Communication: Sister updated at the bedside.  Disposition: Status is: Inpatient Remains inpatient appropriate because: Severity of disease, IV treatment.     Time spent: 35 minutes  Author: Marrion Coy, MD 12/02/2023 11:13 AM  For on call review [email or web address].

## 2023-12-02 NOTE — Plan of Care (Signed)
  Problem: Education: Goal: Knowledge of General Education information will improve Description: Including pain rating scale, medication(s)/side effects and non-pharmacologic comfort measures Outcome: Progressing   Problem: Clinical Measurements: Goal: Ability to maintain clinical measurements within normal limits will improve Outcome: Progressing   Problem: Clinical Measurements: Goal: Respiratory complications will improve Outcome: Progressing   Problem: Clinical Measurements: Goal: Cardiovascular complication will be avoided Outcome: Progressing   Problem: Pain Management: Goal: General experience of comfort will improve Outcome: Progressing   Problem: Safety: Goal: Ability to remain free from injury will improve Outcome: Progressing

## 2023-12-02 NOTE — Progress Notes (Signed)
Spoke to [place], NP about beginning Cardizem gtt with patient's HR in 80s. Provider stated yes he wants gtt started at 5mg /hr. Cardizem is now infusing.

## 2023-12-02 NOTE — Progress Notes (Signed)
CC: SBO Subjective: Patient continues to report being thirsty and asking when he can have some water.  He denies any flatus.  He says that his abdominal pain has resolved but he still having hiccups.  He pulled his NG tube yesterday and this was replaced although looks like the sideport is at the GE junction.  He also went in A-fib with RVR and was started on a Cardizem drip.  Objective: Vital signs in last 24 hours: Temp:  [98.1 F (36.7 C)-99.3 F (37.4 C)] 98.4 F (36.9 C) (12/26 0534) Pulse Rate:  [80-127] 90 (12/26 0630) Resp:  [14-24] 14 (12/26 0630) BP: (112-141)/(71-89) 128/85 (12/26 0630) SpO2:  [92 %-100 %] 98 % (12/26 0630) Weight:  [70.3 kg] 70.3 kg (12/25 1502) Last BM Date : 11/29/23  Intake/Output from previous day: 12/25 0701 - 12/26 0700 In: -  Out: 1920 [Urine:320; Emesis/NG output:1600] Intake/Output this shift: No intake/output data recorded.  Physical exam:  There is bilious output in the canister.  His abdomen is soft, nontender and it is less distended today.  Lab Results: CBC  Recent Labs    12/01/23 0504 12/02/23 0630  WBC 17.1* 13.7*  HGB 14.2 12.9*  HCT 42.5 39.0  PLT 269 277   BMET Recent Labs    12/01/23 0504 12/02/23 0630  NA 134* 133*  K 4.2 3.2*  CL 90* 94*  CO2 27 26  GLUCOSE 122* 95  BUN 62* 62*  CREATININE 5.01* 2.94*  CALCIUM 9.1 8.7*   PT/INR No results for input(s): "LABPROT", "INR" in the last 72 hours. ABG No results for input(s): "PHART", "HCO3" in the last 72 hours.  Invalid input(s): "PCO2", "PO2"  Studies/Results: DG Abd 1 View Result Date: 12/02/2023 CLINICAL DATA:  [id number]. Encounter for imaging study confirm nasogastric tube placement. EXAM: ABDOMEN - 1 VIEW COMPARISON:  CT without contrast 11/30/2023 FINDINGS: 4:25 a.m. This is a high transverse abdomen view. The lung bases are clear. The cardiac size is normal. NGT has been inserted and the tip is at the EG junction and should be advanced into the stomach.  Upper abdominal small bowel dilatation is little changed, small bowel maximum caliber 3.9 cm. Scattered bowel gas again is noted in the transverse colon. There are postsurgical changes in the medial right upper abdomen. IMPRESSION: 1. NGT tip at the EG junction and should be advanced into the stomach. 2. Upper abdominal small bowel dilatation is little changed. Electronically Signed   By: Almira Bar M.D.   On: 12/02/2023 05:04   US RENAL Result Date: 12/01/2023 CLINICAL DATA:  57 year old male with acute renal insufficiency. EXAM: RENAL / URINARY TRACT ULTRASOUND COMPLETE COMPARISON:  CT Abdomen and Pelvis yesterday. FINDINGS: Right Kidney: Renal measurements: 8.6 x 3.7 x 5.0 cm = volume: 82 mL. Maintained cortical echogenicity. No solid right renal mass or hydronephrosis. Left Kidney: Renal measurements: 8.4 x 4.2 x 4.3 cm = volume: 80 mL. Maintained cortical echogenicity. No solid left renal mass or hydronephrosis. Bladder: Appears normal for degree of bladder distention. Other: Estimated prostate volume 12 mL. IMPRESSION: Bilateral renal volume loss, but otherwise negative ultrasound appearance of the kidneys and bladder. Electronically Signed   By: Odessa Fleming M.D.   On: 12/01/2023 10:15   CT ABDOMEN PELVIS WO CONTRAST Result Date: 11/30/2023 CLINICAL DATA:  Abdominal pain and vomiting for 3 days. EXAM: CT ABDOMEN AND PELVIS WITHOUT CONTRAST TECHNIQUE: Multidetector CT imaging of the abdomen and pelvis was performed following the standard protocol without IV contrast. RADIATION  DOSE REDUCTION: This exam was performed according to the departmental dose-optimization program which includes automated exposure control, adjustment of the mA and/or kV according to patient size and/or use of iterative reconstruction technique. COMPARISON:  06/27/2023 FINDINGS: Lower chest: No acute findings. Hepatobiliary: No mass visualized on this unenhanced exam. Pneumobilia is seen, consistent with prior sphincterotomy.  Pancreas: Embolization coils again seen adjacent to the pancreatic head. No mass or inflammatory process visualized on this unenhanced exam. Spleen:  Within normal limits in size. Adrenals/Urinary tract: No evidence of urolithiasis or hydronephrosis. Unremarkable unopacified urinary bladder. Stomach/Bowel: Stomach and proximal small bowel loops are dilated. Surgical staples seen in the proximal small bowel in the right abdomen, with possible transition point noted just distally in the mid abdomen. This raises suspicion for small-bowel obstruction, possibly due to adhesion. No evidence of inflammatory process, or abnormal fluid collections. Vascular/Lymphatic: No pathologically enlarged lymph nodes identified. No evidence of abdominal aortic aneurysm. Reproductive:  No mass or other significant abnormality. Other:  None. Musculoskeletal:  No suspicious bone lesions identified. IMPRESSION: Dilated stomach and proximal small bowel, with possible transition point in the mid abdomen. This raises suspicion for small-bowel obstruction, possibly due to adhesion. No mass or inflammatory process identified. Electronically Signed   By: Danae Orleans M.D.   On: 11/30/2023 18:08   DG Chest Portable 1 View Result Date: 11/30/2023 CLINICAL DATA:  Chest pain, vomiting EXAM: PORTABLE CHEST 1 VIEW COMPARISON:  02/15/2022 FINDINGS: Defibrillator pads and other leads overlying the chest. The heart size and mediastinal contours are within normal limits. Both lungs are clear. The visualized skeletal structures are unremarkable. IMPRESSION: No acute abnormality of the lungs in AP portable projection. Electronically Signed   By: Jearld Lesch M.D.   On: 11/30/2023 16:49    Anti-infectives: Anti-infectives (From admission, onward)    None       Assessment/Plan:  The patient is a 57 year old that is approximately 6-week status post robotic assisted cholecystectomy with common bile duct exploration and hepaticojejunostomy who  came in with several day history of nausea and vomiting.  He had an NG tube for decompression.  He is not passing flatus yet.  His NG tube was removed last night but reinserted and a looks like it is a little bit shallow.  I have asked the bedside nurse to advance his NG tube and we get a KUB to assess if it is in the stomach as this may be contributing to some of his head hiccuping.  I think we should also attempt a Gastrografin challenge today to see if this will help move things through.  I discussed with the patient and his sister that if this fails then we may need to proceed with surgical intervention.  Continue fluid resuscitation as his AKI is improving.  If he does not open up in the next 1 to 2 days then we may need to consider TPN  Baker Pierini, M.D. [facility]

## 2023-12-02 NOTE — Progress Notes (Signed)
Patient transferred to room 244 from [facility] for management of Cardizem gtt. A&Ox4 with no c/o pain or discomfort. Patient's HR on arrival in the 80s. NG tube in place and hooked to low wall suction. Oriented to room, call bell within reach, bed low & locked. Report received from [place], Charity fundraiser.

## 2023-12-02 NOTE — Progress Notes (Signed)
[facility]  ROUNDING NOTE   Subjective:   Patient seen resting in bed, eyes closed Family at bedside NG tube remains in place Patient has not urinated today  Creatinine greatly improved, 2.94 Urine output 1.2 L.  Objective:  Vital signs in last 24 hours:  Temp:  [98.1 F (36.7 C)-98.6 F (37 C)] 98.2 F (36.8 C) (12/26 1100) Pulse Rate:  [65-127] 65 (12/26 1700) Resp:  [14-26] 26 (12/26 1700) BP: (90-141)/(69-85) 124/69 (12/26 1700) SpO2:  [92 %-100 %] 96 % (12/26 1700)  Weight change:  Filed Weights   12/01/23 1502  Weight: 70.3 kg    Intake/Output: I/O last 3 completed shifts: In: 5000 [I.V.:1000; IV Piggyback:4000] Out: 4420 [Urine:320; Emesis/NG output:2900; Other:1200]   Intake/Output this shift:  Total I/O In: 2598.4 [I.V.:2294.5; NG/GT:90; IV Piggyback:213.9] Out: 2325 [Urine:1200; Emesis/NG output:1125]  Physical Exam: General: NAD,   Head: Normocephalic, atraumatic.  NGT  Eyes: Anicteric  Lungs:  Clear to auscultation, normal effort, room air  Heart: Regular rate and rhythm  Abdomen:  Soft, nontender, nondistended, hypoactive bowel sounds  Extremities: No peripheral edema.  Neurologic: Nonfocal, moving all four extremities  Skin: No lesions       Basic Metabolic Panel: Recent Labs  Lab 11/30/23 1550 12/01/23 0504 12/02/23 0630  NA 134* 134* 133*  K 4.4 4.2 3.2*  CL 84* 90* 94*  CO2 26 27 26   GLUCOSE 145* 122* 95  BUN 53* 62* 62*  CREATININE 4.37* 5.01* 2.94*  CALCIUM 10.6* 9.1 8.7*  PHOS  --   --  3.8    Liver Function Tests: Recent Labs  Lab 11/30/23 1550 12/01/23 0504  AST 44* 36  ALT 20 23  ALKPHOS 48 35*  BILITOT 1.1 1.3*  PROT 10.1* 7.6  ALBUMIN 5.7* 4.1   Recent Labs  Lab 11/30/23 1550  LIPASE 30   No results for input(s): "AMMONIA" in the last 168 hours.  CBC: Recent Labs  Lab 11/30/23 1550 12/01/23 0504 12/02/23 0630  WBC 15.6* 17.1* 13.7*  NEUTROABS 13.4*  --   --   HGB 16.2 14.2 12.9*   HCT 50.3 42.5 39.0  MCV 75.3* 74.3* 73.6*  PLT 319 269 277    Cardiac Enzymes: Recent Labs  Lab 11/30/23 1550  CKTOTAL 305    BNP: Invalid input(s): "POCBNP"  CBG: Recent Labs  Lab 11/30/23 1551  GLUCAP 155*    Microbiology: Results for orders placed or performed during the hospital encounter of 11/30/23  Resp panel by RT-PCR (RSV, Flu A&B, Covid) Anterior Nasal Swab     Status: None   Collection Time: 11/30/23  3:57 PM   Specimen: Anterior Nasal Swab  Result Value Ref Range Status   SARS Coronavirus 2 by RT PCR NEGATIVE NEGATIVE Final    Comment: (NOTE) SARS-CoV-2 target nucleic acids are NOT DETECTED.  The SARS-CoV-2 RNA is generally detectable in upper respiratory specimens during the acute phase of infection. The lowest concentration of SARS-CoV-2 viral copies this assay can detect is 138 copies/mL. A negative result does not preclude SARS-Cov-2 infection and should not be used as the sole basis for treatment or other patient management decisions. A negative result may occur with  improper specimen collection/handling, submission of specimen other than nasopharyngeal swab, presence of viral mutation(s) within the areas targeted by this assay, and inadequate number of viral copies(<138 copies/mL). A negative result must be combined with clinical observations, patient history, and epidemiological information. The expected result is Negative.  Fact Sheet for Patients:  BloggerCourse.com  Fact Sheet for Healthcare Providers:  SeriousBroker.it  This test is no t yet approved or cleared by the [place] FDA and  has been authorized for detection and/or diagnosis of SARS-CoV-2 by FDA under an Emergency Use Authorization (EUA). This EUA will remain  in effect (meaning this test can be used) for the duration of the COVID-19 declaration under Section 564(b)(1) of the Act, 21 U.S.C.section 360bbb-3(b)(1), unless  the authorization is terminated  or revoked sooner.       Influenza A by PCR NEGATIVE NEGATIVE Final   Influenza B by PCR NEGATIVE NEGATIVE Final    Comment: (NOTE) The Xpert Xpress SARS-CoV-2/FLU/RSV plus assay is intended as an aid in the diagnosis of influenza from Nasopharyngeal swab specimens and should not be used as a sole basis for treatment. Nasal washings and aspirates are unacceptable for Xpert Xpress SARS-CoV-2/FLU/RSV testing.  Fact Sheet for Patients: BloggerCourse.com  Fact Sheet for Healthcare Providers: SeriousBroker.it  This test is not yet approved or cleared by the [place] FDA and has been authorized for detection and/or diagnosis of SARS-CoV-2 by FDA under an Emergency Use Authorization (EUA). This EUA will remain in effect (meaning this test can be used) for the duration of the COVID-19 declaration under Section 564(b)(1) of the Act, 21 U.S.C. section 360bbb-3(b)(1), unless the authorization is terminated or revoked.     Resp Syncytial Virus by PCR NEGATIVE NEGATIVE Final    Comment: (NOTE) Fact Sheet for Patients: BloggerCourse.com  Fact Sheet for Healthcare Providers: SeriousBroker.it  This test is not yet approved or cleared by the [place] FDA and has been authorized for detection and/or diagnosis of SARS-CoV-2 by FDA under an Emergency Use Authorization (EUA). This EUA will remain in effect (meaning this test can be used) for the duration of the COVID-19 declaration under Section 564(b)(1) of the Act, 21 U.S.C. section 360bbb-3(b)(1), unless the authorization is terminated or revoked.  Performed at [facility], [address] [place], [place] [address]     Coagulation Studies: No results for input(s): "LABPROT", "INR" in the last 72 hours.  Urinalysis: No results for input(s): "COLORURINE", "LABSPEC", "PHURINE",  "GLUCOSEU", "HGBUR", "BILIRUBINUR", "KETONESUR", "PROTEINUR", "UROBILINOGEN", "NITRITE", "LEUKOCYTESUR" in the last 72 hours.  Invalid input(s): "APPERANCEUR"    Imaging: DG Abd 1 View Result Date: 12/02/2023 CLINICAL DATA:  Enteric tube placement. EXAM: ABDOMEN - 1 VIEW COMPARISON:  Abdominal x-ray from same day at 0425 hours. FINDINGS: Interval advancement of the enteric tube now appropriately positioned within the stomach. Dilated loops of proximal small bowel are unchanged. IMPRESSION: 1. Interval advancement of the enteric tube now appropriately positioned within the stomach. Electronically Signed   By: Obie Dredge M.D.   On: 12/02/2023 08:49   DG Abd 1 View Result Date: 12/02/2023 CLINICAL DATA:  [id number]. Encounter for imaging study confirm nasogastric tube placement. EXAM: ABDOMEN - 1 VIEW COMPARISON:  CT without contrast 11/30/2023 FINDINGS: 4:25 a.m. This is a high transverse abdomen view. The lung bases are clear. The cardiac size is normal. NGT has been inserted and the tip is at the EG junction and should be advanced into the stomach. Upper abdominal small bowel dilatation is little changed, small bowel maximum caliber 3.9 cm. Scattered bowel gas again is noted in the transverse colon. There are postsurgical changes in the medial right upper abdomen. IMPRESSION: 1. NGT tip at the EG junction and should be advanced into the stomach. 2. Upper abdominal small bowel dilatation is little changed. Electronically Signed  By: Almira Bar M.D.   On: 12/02/2023 05:04   US RENAL Result Date: 12/01/2023 CLINICAL DATA:  57 year old male with acute renal insufficiency. EXAM: RENAL / URINARY TRACT ULTRASOUND COMPLETE COMPARISON:  CT Abdomen and Pelvis yesterday. FINDINGS: Right Kidney: Renal measurements: 8.6 x 3.7 x 5.0 cm = volume: 82 mL. Maintained cortical echogenicity. No solid right renal mass or hydronephrosis. Left Kidney: Renal measurements: 8.4 x 4.2 x 4.3 cm = volume: 80 mL. Maintained  cortical echogenicity. No solid left renal mass or hydronephrosis. Bladder: Appears normal for degree of bladder distention. Other: Estimated prostate volume 12 mL. IMPRESSION: Bilateral renal volume loss, but otherwise negative ultrasound appearance of the kidneys and bladder. Electronically Signed   By: Odessa Fleming M.D.   On: 12/01/2023 10:15     Medications:    sodium chloride 125 mL/hr at 12/02/23 1600   diltiazem (CARDIZEM) infusion 5 mg/hr (12/02/23 1600)    enoxaparin (LOVENOX) injection  30 mg Subcutaneous Q24H   pantoprazole (PROTONIX) IV  40 mg Intravenous Q12H   ondansetron **OR** ondansetron (ZOFRAN) IV, prochlorperazine  Assessment/ Plan:  [name] is a 57 y.o.  male with a PMHx of alcohol abuse, hypertension, history of complicated choledocholithiasis including choledocho duodenal fistula, Roux-en-Y hepaticojejunostomy, cholecystectomy 10/20/2023, prior duodenal ulcer requiring coiling, who was admitted to [facility] on 11/30/2023 for SBO (small bowel obstruction) (HCC) [K56.609] AKI (acute kidney injury) (HCC) [N17.9]   Acute kidney injury likely secondary to dehydration from small bowel obstruction. Baseline creatinine noted to be 1.17. Presenting creatinine 4.37. \Current creatinine improved to 2.94 from 5.01. Continue IVF for now. No acute need for dialysis.   Lab Results  Component Value Date   CREATININE 2.94 (H) 12/02/2023   CREATININE 5.01 (H) 12/01/2023   CREATININE 4.37 (H) 11/30/2023    Intake/Output Summary (Last 24 hours) at 12/02/2023 1757 Last data filed at 12/02/2023 1700 Gross per 24 hour  Intake 2598.4 ml  Output 3325 ml  Net -726.6 ml   2.  Mild hyponatremia.  Serum sodium 133.  Likely related to bowel obstruction.  Continue hydration with 0.9 normal saline.   3.  Small bowel obstruction.  Currently has NG tube in place.  Management as per hospitalist and surgery.      LOS: 2 Noelia Lenart 12/26/20245:57 PM

## 2023-12-03 DIAGNOSIS — N179 Acute kidney failure, unspecified: Secondary | ICD-10-CM | POA: Diagnosis not present

## 2023-12-03 DIAGNOSIS — K56609 Unspecified intestinal obstruction, unspecified as to partial versus complete obstruction: Secondary | ICD-10-CM | POA: Diagnosis not present

## 2023-12-03 DIAGNOSIS — J449 Chronic obstructive pulmonary disease, unspecified: Secondary | ICD-10-CM | POA: Diagnosis not present

## 2023-12-03 LAB — BASIC METABOLIC PANEL
Anion gap: 18 — ABNORMAL HIGH (ref 5–15)
BUN: 56 mg/dL — ABNORMAL HIGH (ref 6–20)
CO2: 25 mmol/L (ref 22–32)
Calcium: 9.6 mg/dL (ref 8.9–10.3)
Chloride: 98 mmol/L (ref 98–111)
Creatinine, Ser: 2.01 mg/dL — ABNORMAL HIGH (ref 0.61–1.24)
GFR, Estimated: 38 mL/min — ABNORMAL LOW (ref >60)
Glucose, Bld: 89 mg/dL (ref 70–99)
Potassium: 3.4 mmol/L — ABNORMAL LOW (ref 3.5–5.1)
Sodium: 141 mmol/L (ref 135–145)

## 2023-12-03 LAB — MAGNESIUM: Magnesium: 2.6 mg/dL — ABNORMAL HIGH (ref 1.7–2.4)

## 2023-12-03 MED ORDER — POTASSIUM CHLORIDE IN NACL 20-0.9 MEQ/L-% IV SOLN
INTRAVENOUS | Status: AC
  Filled 2023-12-03 (×2): qty 1000

## 2023-12-03 MED ORDER — POTASSIUM CHLORIDE IN NACL 20-0.9 MEQ/L-% IV SOLN
INTRAVENOUS | Status: DC
Start: 2023-12-03 — End: 2023-12-04
  Filled 2023-12-03 (×3): qty 1000

## 2023-12-03 MED ORDER — ENOXAPARIN SODIUM 40 MG/0.4ML IJ SOSY
40.0000 mg | PREFILLED_SYRINGE | INTRAMUSCULAR | Status: DC
  Administered 2023-12-03 – 2023-12-05 (×3): 40 mg via SUBCUTANEOUS
  Filled 2023-12-03 (×3): qty 0.4

## 2023-12-03 NOTE — Plan of Care (Signed)

## 2023-12-03 NOTE — Progress Notes (Signed)
CC: SBO Subjective: Mr. Ruter underwent small bowel follow through yesterday with KUB showing contrast in colon. This AM he reports no flatus or bowel movements. He denies abdominal bloating or distention. He has not been out of bed.   Objective: Vital signs in last 24 hours: Temp:  [97.9 F (36.6 C)-98.9 F (37.2 C)] 97.9 F (36.6 C) (12/27 0700) Pulse Rate:  [65-86] 74 (12/27 0751) Resp:  [16-26] 20 (12/27 0751) BP: (90-156)/(69-89) 126/79 (12/27 0751) SpO2:  [95 %-100 %] 100 % (12/27 0751) Last BM Date : 11/29/23  Intake/Output from previous day: 12/26 0701 - 12/27 0700 In: 2768.4 [I.V.:2464.5; NG/GT:90; IV Piggyback:213.9] Out: 4225 [Urine:1350; Emesis/NG output:2875] Intake/Output this shift: Total I/O In: 19.3 [I.V.:19.3] Out: -   Physical exam:  There is bilious output in the canister.  His abdomen is soft, nontender and non-distended today. Lap port sites have healed well.   Lab Results: CBC  Recent Labs    12/01/23 0504 12/02/23 0630  WBC 17.1* 13.7*  HGB 14.2 12.9*  HCT 42.5 39.0  PLT 269 277   BMET Recent Labs    12/02/23 0630 12/03/23 0450  NA 133* 141  K 3.2* 3.4*  CL 94* 98  CO2 26 25  GLUCOSE 95 89  BUN 62* 56*  CREATININE 2.94* 2.01*  CALCIUM 8.7* 9.6   PT/INR No results for input(s): "LABPROT", "INR" in the last 72 hours. ABG No results for input(s): "PHART", "HCO3" in the last 72 hours.  Invalid input(s): "PCO2", "PO2"  Studies/Results: DG Abd Portable 1V-Small Bowel Obstruction Protocol-initial, 8 hr delay Result Date: 12/02/2023 CLINICAL DATA:  8 hour follow-up film EXAM: PORTABLE ABDOMEN - 1 VIEW COMPARISON:  Film from earlier in the same day. FINDINGS: Previously administered contrast now lies within the distal small bowel. Persistent small bowel dilatation is noted. Some early colonic contrast is noted as well. IMPRESSION: Persistent small bowel dilatation with passage of contrast material into the distal small bowel and  proximal colon consistent with a partial small bowel obstruction. Electronically Signed   By: Alcide Clever M.D.   On: 12/02/2023 20:22   DG Abd 1 View Result Date: 12/02/2023 CLINICAL DATA:  [id number] Small bowel obstruction (HCC) [id number] EXAM: ABDOMEN - 1 VIEW COMPARISON:  X-ray abdomen 12/02/2023 8:32 a.m., CT abdomen pelvis 11/30/2023 FINDINGS: PO contrast opacifies several loops of dilated small bowel as well as the gastric lumen. Bowel suture staples noted within the right upper quadrant. No PO contrast identified within the colon. No radio-opaque calculi or other significant radiographic abnormality are seen. Vascular calcifications. IMPRESSION: PO contrast opacifies several loops of dilated small bowel as well as the gastric lumen. Electronically Signed   By: Tish Frederickson M.D.   On: 12/02/2023 18:13   DG Abd 1 View Result Date: 12/02/2023 CLINICAL DATA:  Enteric tube placement. EXAM: ABDOMEN - 1 VIEW COMPARISON:  Abdominal x-ray from same day at 0425 hours. FINDINGS: Interval advancement of the enteric tube now appropriately positioned within the stomach. Dilated loops of proximal small bowel are unchanged. IMPRESSION: 1. Interval advancement of the enteric tube now appropriately positioned within the stomach. Electronically Signed   By: Obie Dredge M.D.   On: 12/02/2023 08:49   DG Abd 1 View Result Date: 12/02/2023 CLINICAL DATA:  [id number]. Encounter for imaging study confirm nasogastric tube placement. EXAM: ABDOMEN - 1 VIEW COMPARISON:  CT without contrast 11/30/2023 FINDINGS: 4:25 a.m. This is a high transverse abdomen view. The lung bases are clear. The cardiac size is normal.  NGT has been inserted and the tip is at the EG junction and should be advanced into the stomach. Upper abdominal small bowel dilatation is little changed, small bowel maximum caliber 3.9 cm. Scattered bowel gas again is noted in the transverse colon. There are postsurgical changes in the medial right upper abdomen.  IMPRESSION: 1. NGT tip at the EG junction and should be advanced into the stomach. 2. Upper abdominal small bowel dilatation is little changed. Electronically Signed   By: Almira Bar M.D.   On: 12/02/2023 05:04   US RENAL Result Date: 12/01/2023 CLINICAL DATA:  57 year old male with acute renal insufficiency. EXAM: RENAL / URINARY TRACT ULTRASOUND COMPLETE COMPARISON:  CT Abdomen and Pelvis yesterday. FINDINGS: Right Kidney: Renal measurements: 8.6 x 3.7 x 5.0 cm = volume: 82 mL. Maintained cortical echogenicity. No solid right renal mass or hydronephrosis. Left Kidney: Renal measurements: 8.4 x 4.2 x 4.3 cm = volume: 80 mL. Maintained cortical echogenicity. No solid left renal mass or hydronephrosis. Bladder: Appears normal for degree of bladder distention. Other: Estimated prostate volume 12 mL. IMPRESSION: Bilateral renal volume loss, but otherwise negative ultrasound appearance of the kidneys and bladder. Electronically Signed   By: Odessa Fleming M.D.   On: 12/01/2023 10:15    Anti-infectives: Anti-infectives (From admission, onward)    None       Assessment/Plan:  The patient is a 57 year old that is approximately 6-week status post robotic assisted cholecystectomy with common bile duct exploration and hepaticojejunostomy who came in with several day history of nausea and vomiting.  He had an NG tube for decompression.  He is not passing flatus yet.  He had a Gastrografin challenge yesterday with a KUB that showed contrast that went into the colon.  However, he is not having bowel function.  He also has a large amount of output from his NG tube.  At this time I am hoping that the contrast passing through well help him to start having flatus and bowel movements today.  Continue NG tube for decompression.  He may benefit from parenteral nutrition given he needs been n.p.o. for quite some time.  I will defer this decision to the primary team but I hope that in the next day or 2 he can start on a  diet.  Baker Pierini, M.D. [facility]

## 2023-12-03 NOTE — Progress Notes (Signed)
[facility]  ROUNDING NOTE   Subjective:   Patient seen laying in bed, family at bedside NG tube remains in place Patient with hiccups  Creatinine 2.01 from 2.94 Urine output 1.35 L in preceding 24 hours  Objective:  Vital signs in last 24 hours:  Temp:  [97.9 F (36.6 C)-98.9 F (37.2 C)] 97.9 F (36.6 C) (12/27 0700) Pulse Rate:  [62-86] 62 (12/27 0800) Resp:  [16-26] 21 (12/27 0800) BP: (90-156)/(69-89) 118/71 (12/27 0800) SpO2:  [96 %-100 %] 99 % (12/27 0800)  Weight change:  Filed Weights   12/01/23 1502  Weight: 70.3 kg    Intake/Output: I/O last 3 completed shifts: In: 2768.4 [I.V.:2464.5; NG/GT:90; IV Piggyback:213.9] Out: 5225 [Urine:1350; Emesis/NG output:3875]   Intake/Output this shift:  Total I/O In: 19.3 [I.V.:19.3] Out: -   Physical Exam: General: NAD  Head: Normocephalic, atraumatic.  NGT  Eyes: Anicteric  Lungs:  Clear to auscultation, normal effort, room air  Heart: Regular rate and rhythm  Abdomen:  Soft, nontender, nondistended, hypoactive bowel sounds  Extremities: No peripheral edema.  Neurologic: Nonfocal, moving all four extremities  Skin: No lesions       Basic Metabolic Panel: Recent Labs  Lab 11/30/23 1550 12/01/23 0504 12/02/23 0630 12/03/23 0450  NA 134* 134* 133* 141  K 4.4 4.2 3.2* 3.4*  CL 84* 90* 94* 98  CO2 26 27 26 25   GLUCOSE 145* 122* 95 89  BUN 53* 62* 62* 56*  CREATININE 4.37* 5.01* 2.94* 2.01*  CALCIUM 10.6* 9.1 8.7* 9.6  MG  --   --   --  2.6*  PHOS  --   --  3.8  --     Liver Function Tests: Recent Labs  Lab 11/30/23 1550 12/01/23 0504  AST 44* 36  ALT 20 23  ALKPHOS 48 35*  BILITOT 1.1 1.3*  PROT 10.1* 7.6  ALBUMIN 5.7* 4.1   Recent Labs  Lab 11/30/23 1550  LIPASE 30   No results for input(s): "AMMONIA" in the last 168 hours.  CBC: Recent Labs  Lab 11/30/23 1550 12/01/23 0504 12/02/23 0630  WBC 15.6* 17.1* 13.7*  NEUTROABS 13.4*  --   --   HGB 16.2 14.2 12.9*   HCT 50.3 42.5 39.0  MCV 75.3* 74.3* 73.6*  PLT 319 269 277    Cardiac Enzymes: Recent Labs  Lab 11/30/23 1550  CKTOTAL 305    BNP: Invalid input(s): "POCBNP"  CBG: Recent Labs  Lab 11/30/23 1551  GLUCAP 155*    Microbiology: Results for orders placed or performed during the hospital encounter of 11/30/23  Resp panel by RT-PCR (RSV, Flu A&B, Covid) Anterior Nasal Swab     Status: None   Collection Time: 11/30/23  3:57 PM   Specimen: Anterior Nasal Swab  Result Value Ref Range Status   SARS Coronavirus 2 by RT PCR NEGATIVE NEGATIVE Final    Comment: (NOTE) SARS-CoV-2 target nucleic acids are NOT DETECTED.  The SARS-CoV-2 RNA is generally detectable in upper respiratory specimens during the acute phase of infection. The lowest concentration of SARS-CoV-2 viral copies this assay can detect is 138 copies/mL. A negative result does not preclude SARS-Cov-2 infection and should not be used as the sole basis for treatment or other patient management decisions. A negative result may occur with  improper specimen collection/handling, submission of specimen other than nasopharyngeal swab, presence of viral mutation(s) within the areas targeted by this assay, and inadequate number of viral copies(<138 copies/mL). A negative result must be  combined with clinical observations, patient history, and epidemiological information. The expected result is Negative.  Fact Sheet for Patients:  BloggerCourse.com  Fact Sheet for Healthcare Providers:  SeriousBroker.it  This test is no t yet approved or cleared by the [place] FDA and  has been authorized for detection and/or diagnosis of SARS-CoV-2 by FDA under an Emergency Use Authorization (EUA). This EUA will remain  in effect (meaning this test can be used) for the duration of the COVID-19 declaration under Section 564(b)(1) of the Act, 21 U.S.C.section 360bbb-3(b)(1), unless  the authorization is terminated  or revoked sooner.       Influenza A by PCR NEGATIVE NEGATIVE Final   Influenza B by PCR NEGATIVE NEGATIVE Final    Comment: (NOTE) The Xpert Xpress SARS-CoV-2/FLU/RSV plus assay is intended as an aid in the diagnosis of influenza from Nasopharyngeal swab specimens and should not be used as a sole basis for treatment. Nasal washings and aspirates are unacceptable for Xpert Xpress SARS-CoV-2/FLU/RSV testing.  Fact Sheet for Patients: BloggerCourse.com  Fact Sheet for Healthcare Providers: SeriousBroker.it  This test is not yet approved or cleared by the [place] FDA and has been authorized for detection and/or diagnosis of SARS-CoV-2 by FDA under an Emergency Use Authorization (EUA). This EUA will remain in effect (meaning this test can be used) for the duration of the COVID-19 declaration under Section 564(b)(1) of the Act, 21 U.S.C. section 360bbb-3(b)(1), unless the authorization is terminated or revoked.     Resp Syncytial Virus by PCR NEGATIVE NEGATIVE Final    Comment: (NOTE) Fact Sheet for Patients: BloggerCourse.com  Fact Sheet for Healthcare Providers: SeriousBroker.it  This test is not yet approved or cleared by the [place] FDA and has been authorized for detection and/or diagnosis of SARS-CoV-2 by FDA under an Emergency Use Authorization (EUA). This EUA will remain in effect (meaning this test can be used) for the duration of the COVID-19 declaration under Section 564(b)(1) of the Act, 21 U.S.C. section 360bbb-3(b)(1), unless the authorization is terminated or revoked.  Performed at [facility], [address] [place], [place] [address]     Coagulation Studies: No results for input(s): "LABPROT", "INR" in the last 72 hours.  Urinalysis: No results for input(s): "COLORURINE", "LABSPEC", "PHURINE",  "GLUCOSEU", "HGBUR", "BILIRUBINUR", "KETONESUR", "PROTEINUR", "UROBILINOGEN", "NITRITE", "LEUKOCYTESUR" in the last 72 hours.  Invalid input(s): "APPERANCEUR"    Imaging: DG Abd Portable 1V-Small Bowel Obstruction Protocol-initial, 8 hr delay Result Date: 12/02/2023 CLINICAL DATA:  8 hour follow-up film EXAM: PORTABLE ABDOMEN - 1 VIEW COMPARISON:  Film from earlier in the same day. FINDINGS: Previously administered contrast now lies within the distal small bowel. Persistent small bowel dilatation is noted. Some early colonic contrast is noted as well. IMPRESSION: Persistent small bowel dilatation with passage of contrast material into the distal small bowel and proximal colon consistent with a partial small bowel obstruction. Electronically Signed   By: Alcide Clever M.D.   On: 12/02/2023 20:22   DG Abd 1 View Result Date: 12/02/2023 CLINICAL DATA:  [id number] Small bowel obstruction (HCC) [id number] EXAM: ABDOMEN - 1 VIEW COMPARISON:  X-ray abdomen 12/02/2023 8:32 a.m., CT abdomen pelvis 11/30/2023 FINDINGS: PO contrast opacifies several loops of dilated small bowel as well as the gastric lumen. Bowel suture staples noted within the right upper quadrant. No PO contrast identified within the colon. No radio-opaque calculi or other significant radiographic abnormality are seen. Vascular calcifications. IMPRESSION: PO contrast opacifies several loops of dilated small bowel as well as  the gastric lumen. Electronically Signed   By: Tish Frederickson M.D.   On: 12/02/2023 18:13   DG Abd 1 View Result Date: 12/02/2023 CLINICAL DATA:  Enteric tube placement. EXAM: ABDOMEN - 1 VIEW COMPARISON:  Abdominal x-ray from same day at 0425 hours. FINDINGS: Interval advancement of the enteric tube now appropriately positioned within the stomach. Dilated loops of proximal small bowel are unchanged. IMPRESSION: 1. Interval advancement of the enteric tube now appropriately positioned within the stomach. Electronically Signed   By:  Obie Dredge M.D.   On: 12/02/2023 08:49   DG Abd 1 View Result Date: 12/02/2023 CLINICAL DATA:  [id number]. Encounter for imaging study confirm nasogastric tube placement. EXAM: ABDOMEN - 1 VIEW COMPARISON:  CT without contrast 11/30/2023 FINDINGS: 4:25 a.m. This is a high transverse abdomen view. The lung bases are clear. The cardiac size is normal. NGT has been inserted and the tip is at the EG junction and should be advanced into the stomach. Upper abdominal small bowel dilatation is little changed, small bowel maximum caliber 3.9 cm. Scattered bowel gas again is noted in the transverse colon. There are postsurgical changes in the medial right upper abdomen. IMPRESSION: 1. NGT tip at the EG junction and should be advanced into the stomach. 2. Upper abdominal small bowel dilatation is little changed. Electronically Signed   By: Almira Bar M.D.   On: 12/02/2023 05:04     Medications:    0.9 % NaCl with KCl 20 mEq / L 100 mL/hr at 12/03/23 1233   diltiazem (CARDIZEM) infusion 5 mg/hr (12/02/23 2327)    enoxaparin (LOVENOX) injection  30 mg Subcutaneous Q24H   pantoprazole (PROTONIX) IV  40 mg Intravenous Q12H   ondansetron **OR** ondansetron (ZOFRAN) IV, prochlorperazine  Assessment/ Plan:  Wesley Francis is a 57 y.o.  male with a PMHx of alcohol abuse, hypertension, history of complicated choledocholithiasis including choledocho duodenal fistula, Roux-en-Y hepaticojejunostomy, cholecystectomy 10/20/2023, prior duodenal ulcer requiring coiling, who was admitted to [facility] on 11/30/2023 for SBO (small bowel obstruction) (HCC) [K56.609] AKI (acute kidney injury) (HCC) [N17.9]   Acute kidney injury likely secondary to dehydration from small bowel obstruction. Baseline creatinine noted to be 1.17. Presenting creatinine 4.37. Creatinine continues to improve with IV hydration.  Patient continues to have decent output from NG tube (2.7 L), continue IVF for now. No acute need for dialysis.    Lab Results  Component Value Date   CREATININE 2.01 (H) 12/03/2023   CREATININE 2.94 (H) 12/02/2023   CREATININE 5.01 (H) 12/01/2023    Intake/Output Summary (Last 24 hours) at 12/03/2023 1317 Last data filed at 12/03/2023 0752 Gross per 24 hour  Intake 2697.73 ml  Output 3425 ml  Net -727.27 ml   2.  Mild hyponatremia.  Serum sodium 144.  Likely related to bowel obstruction.  Corrected   3.  Small bowel obstruction.  Currently has NG tube in place.  Management as per hospitalist and surgery.      LOS: 3 Nneoma Harral 12/27/20241:17 PM

## 2023-12-03 NOTE — Progress Notes (Signed)
Progress Note   Patient: Wesley Francis MVH:[id number] DOB: Oct 06, 1966 DOA: 11/30/2023     3 DOS: the patient was seen and examined on 12/03/2023   Brief hospital course: ANISH LAESSIG is a 57 y.o. 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 Repeated KUB did not show any improvement, patient is followed by general surgery, small bowel follow-through showed a Gastrografin has not passed into colon.  However, patient still have a large NG output.  Per surgery, anticipating improvement in 1 to 2 days.  At this point, we will hold off parenteral nutrition.   Lactic acidosis. Initial lactic acid level was 3.1, followed by 9.0, then dropped back to 2.4.  9.0 is not consistent with clinical picture, most likely due to lab  error. Patient currently does not have any abdominal rebound tenderness, abdominal pain seems to be better.  Not consistent with bowel ischemia.   Acute kidney injury. Hyponatremia. Hypokalemia. Patient prior renal function was normal, creatinine went up to 5.01 today.  Renal ultrasound did not show any obstruction, but did show evidence of chronic kidney disease. Function continued to improve with IV fluids.  Added potassium into fluids.   Alcohol abuse Tobacco abuse Continue CIWA protocol, continue nicotine patch.   Asthma, chronic Stable.     Elevated troponin Appear to be due to acute renal failure.   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          Subjective:  Patient doing well today, no abdominal pain or nausea vomiting.  No bowel movement, has not passed gas.  Physical Exam: Vitals:   12/03/23 0300 12/03/23 0700 12/03/23 0751 12/03/23 0800  BP:   126/79 118/71  Pulse:   74 62  Resp:   20 (!) 21  Temp: 98.9 F (37.2 C) 97.9 F (36.6 C)    TempSrc: Oral Oral    SpO2:   100% 99%  Weight:  Height:       General exam: Appears calm and comfortable  Respiratory system: Clear to auscultation. Respiratory effort normal. Cardiovascular system: S1 & S2 heard, RRR. No JVD, murmurs, rubs, gallops or clicks. No pedal edema. Gastrointestinal system: Abdomen is nondistended, soft and  nontender. No organomegaly or masses felt. Normal bowel sounds heard. Central nervous system: Alert and oriented. No focal neurological deficits. Extremities: Symmetric 5 x 5 power. Skin: No rashes, lesions or ulcers Psychiatry: Judgement and insight appear normal. Mood & affect appropriate.    Data Reviewed:  X-ray and lab results reviewed.  Family Communication: Sister updated at bedside.  Disposition: Status is: Inpatient Remains inpatient appropriate because: Severity of disease, IV treatment.     Time spent: 35 minutes  Author: Marrion Coy, MD 12/03/2023 12:33 PM  For on call review [email or web address].

## 2023-12-04 ENCOUNTER — Inpatient Hospital Stay: Payer: MEDICAID

## 2023-12-04 DIAGNOSIS — K56609 Unspecified intestinal obstruction, unspecified as to partial versus complete obstruction: Secondary | ICD-10-CM | POA: Diagnosis not present

## 2023-12-04 DIAGNOSIS — N179 Acute kidney failure, unspecified: Secondary | ICD-10-CM | POA: Diagnosis not present

## 2023-12-04 DIAGNOSIS — J449 Chronic obstructive pulmonary disease, unspecified: Secondary | ICD-10-CM | POA: Diagnosis not present

## 2023-12-04 LAB — CBC
HCT: 42.3 % (ref 39.0–52.0)
Hemoglobin: 13.6 g/dL (ref 13.0–17.0)
MCH: 24.3 pg — ABNORMAL LOW (ref 26.0–34.0)
MCHC: 32.2 g/dL (ref 30.0–36.0)
MCV: 75.5 fL — ABNORMAL LOW (ref 80.0–100.0)
Platelets: 321 10*3/uL (ref 150–400)
RBC: 5.6 MIL/uL (ref 4.22–5.81)
RDW: 14.4 % (ref 11.5–15.5)
WBC: 13.1 10*3/uL — ABNORMAL HIGH (ref 4.0–10.5)
nRBC: 0 % (ref 0.0–0.2)

## 2023-12-04 LAB — BASIC METABOLIC PANEL
Anion gap: 15 (ref 5–15)
BUN: 49 mg/dL — ABNORMAL HIGH (ref 6–20)
CO2: 22 mmol/L (ref 22–32)
Calcium: 9.2 mg/dL (ref 8.9–10.3)
Chloride: 107 mmol/L (ref 98–111)
Creatinine, Ser: 1.72 mg/dL — ABNORMAL HIGH (ref 0.61–1.24)
GFR, Estimated: 46 mL/min — ABNORMAL LOW (ref >60)
Glucose, Bld: 91 mg/dL (ref 70–99)
Potassium: 3.8 mmol/L (ref 3.5–5.1)
Sodium: 144 mmol/L (ref 135–145)

## 2023-12-04 MED ORDER — DEXTROSE IN LACTATED RINGERS 5 % IV SOLN: INTRAVENOUS | Status: AC

## 2023-12-04 NOTE — Progress Notes (Signed)
Progress Note   Patient: Wesley Francis JXB:[id number] DOB: 21-Feb-1966 DOA: 11/30/2023     4 DOS: the patient was seen and examined on 12/04/2023   Brief hospital course: ASHLIN PREVOST is a 57 y.o. 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 Repeated KUB did not show any improvement, patient is followed by general surgery, small bowel follow-through showed a Gastrografin has not passed into colon.  However, patient still have large NG output.   General surgery is considering possible surgery on Monday if condition does not improve.  At this point, we may have to start TPN.  However, due to acute kidney injury, I prefer to wait until renal function recovered to place a PICC line.   Lactic acidosis. Initial lactic acid level was  3.1, followed by 9.0, then dropped back to 2.4.  9.0 is not consistent with clinical picture, most likely due to lab error. Patient currently does not have any abdominal rebound tenderness, abdominal pain seems to be better.  Not consistent with bowel ischemia.   Acute kidney injury. Hyponatremia. Hypokalemia. Patient prior renal function was normal, creatinine went up to 5.01 today.  Renal ultrasound did not show any obstruction, but did show evidence of chronic kidney disease. Renal function still improving, continue IV fluids, changed to D5 normal saline.   Alcohol abuse Tobacco abuse No evidence of alcohol withdrawal.  Check a magnesium and phosphate again tomorrow.   Asthma, chronic Stable.     Elevated troponin Appear to be due to acute renal failure.   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      Subjective:  Patient still has significant output from NG suction, no abdominal pain or nausea vomiting.  Has not passed gas or bowel movement.  Physical Exam: Vitals:   12/04/23 0800 12/04/23 0900 12/04/23 1000 12/04/23 1211  BP: 130/86 126/81 136/89 128/80  Pulse: 81   82  Resp: 20 20 17  18  Temp:    98.5 F (36.9 C)  TempSrc:    Oral  SpO2: 98%   100%  Weight:      Height:       General exam: Appears calm and comfortable  Respiratory system: Clear to auscultation. Respiratory effort  normal. Cardiovascular system: S1 & S2 heard, RRR. No JVD, murmurs, rubs, gallops or clicks. No pedal edema. Gastrointestinal system: Abdomen is nondistended, soft and nontender. No organomegaly or masses felt. Normal bowel sounds heard. Central nervous system: Alert and oriented. No focal neurological deficits. Extremities: Symmetric 5 x 5 power. Skin: No rashes, lesions or ulcers Psychiatry: Judgement and insight appear normal. Mood & affect appropriate.    Data Reviewed:  Lab results reviewed.  Family Communication: Wife updated at bedside.  Disposition: Status is: Inpatient Remains inpatient appropriate because: Severity of disease, IV fluids, possible inpatient procedure.     Time spent: 35 minutes  Author: Marrion Coy, MD 12/04/2023 12:36 PM  For on call review [email or web address].

## 2023-12-04 NOTE — Progress Notes (Signed)
[facility]  PROGRESS NOTE   Subjective:   Patient seen at bedside.  NG tube suctioning progress.  Entire family at bedside.  Objective:  Vital signs: Blood pressure 132/89, pulse 91, temperature 98.2 F (36.8 C), temperature source Oral, resp. rate 18, height 5\' 6"  (1.676 m), weight 60.4 kg, SpO2 99%.  Intake/Output Summary (Last 24 hours) at 12/04/2023 1643 Last data filed at 12/04/2023 1300 Gross per 24 hour  Intake 2260.03 ml  Output 780 ml  Net 1480.03 ml   Filed Weights   12/01/23 1502 12/04/23 0400  Weight: 70.3 kg 60.4 kg     Physical Exam: General:  No acute distress  Head:  Normocephalic, atraumatic. Moist oral mucosal membranes  Eyes:  Anicteric  Neck:  Supple  Lungs:   Clear to auscultation, normal effort  Heart:  S1S2 no rubs  Abdomen:   Soft, nontender, bowel sounds absent   Extremities:  peripheral edema.  Neurologic:  Awake, alert, following commands  Skin:  No lesions  Access:     Basic Metabolic Panel: Recent Labs  Lab 11/30/23 1550 12/01/23 0504 12/02/23 0630 12/03/23 0450 12/04/23 0502  NA 134* 134* 133* 141 144  K 4.4 4.2 3.2* 3.4* 3.8  CL 84* 90* 94* 98 107  CO2 26 27 26 25 22   GLUCOSE 145* 122* 95 89 91  BUN 53* 62* 62* 56* 49*  CREATININE 4.37* 5.01* 2.94* 2.01* 1.72*  CALCIUM 10.6* 9.1 8.7* 9.6 9.2  MG  --   --   --  2.6*  --   PHOS  --   --  3.8  --   --    GFR: Estimated Creatinine Clearance: 40 mL/min (A) (by C-G formula based on SCr of 1.72 mg/dL (H)).  Liver Function Tests: Recent Labs  Lab 11/30/23 1550 12/01/23 0504  AST 44* 36  ALT 20 23  ALKPHOS 48 35*  BILITOT 1.1 1.3*  PROT 10.1* 7.6  ALBUMIN 5.7* 4.1   Recent Labs  Lab 11/30/23 1550  LIPASE 30   No results for input(s): "AMMONIA" in the last 168 hours.  CBC: Recent Labs  Lab 11/30/23 1550 12/01/23 0504 12/02/23 0630 12/04/23 0502  WBC 15.6* 17.1* 13.7* 13.1*  NEUTROABS 13.4*  --   --   --   HGB 16.2 14.2 12.9* 13.6  HCT 50.3  42.5 39.0 42.3  MCV 75.3* 74.3* 73.6* 75.5*  PLT 319 269 277 321     HbA1C: Hgb A1c MFr Bld  Date/Time Value Ref Range Status  12/20/2017 06:14 PM 4.9 4.8 - 5.6 % Final    Comment:    (NOTE) Pre diabetes:          5.7%-6.4% Diabetes:              >6.4% Glycemic control for   <7.0% adults with diabetes     Urinalysis: No results for input(s): "COLORURINE", "LABSPEC", "PHURINE", "GLUCOSEU", "HGBUR", "BILIRUBINUR", "KETONESUR", "PROTEINUR", "UROBILINOGEN", "NITRITE", "LEUKOCYTESUR" in the last 72 hours.  Invalid input(s): "APPERANCEUR"    Imaging: DG Abd 1 View Result Date: 12/04/2023 CLINICAL DATA:  Follow up SBO EXAM: ABDOMEN - 1 VIEW COMPARISON:  12/02/2023. FINDINGS: Small bowel dilatation present previously has somewhat improved. No radiopaque stones. Air overlies the descending colon and rectosigmoid. NG tube is partially imaged. IMPRESSION: Improving small bowel dilatation. Electronically Signed   By: Layla Maw M.D.   On: 12/04/2023 10:46   DG Abd Portable 1V-Small Bowel Obstruction Protocol-initial, 8 hr delay Result Date: 12/02/2023 CLINICAL DATA:  8 hour follow-up film EXAM: PORTABLE ABDOMEN - 1 VIEW COMPARISON:  Film from earlier in the same day. FINDINGS: Previously administered contrast now lies within the distal small bowel. Persistent small bowel dilatation is noted. Some early colonic contrast is noted as well. IMPRESSION: Persistent small bowel dilatation with passage of contrast material into the distal small bowel and proximal colon consistent with a partial small bowel obstruction. Electronically Signed   By: Alcide Clever M.D.   On: 12/02/2023 20:22     Medications:    dextrose 5% lactated ringers 100 mL/hr at 12/04/23 1200    enoxaparin (LOVENOX) injection  40 mg Subcutaneous Q24H   pantoprazole (PROTONIX) IV  40 mg Intravenous Q12H    Assessment/ Plan:     [name] is a 57 y.o.  male with a PMHx of alcohol abuse, hypertension,  history of complicated choledocholithiasis including choledocho duodenal fistula, Roux-en-Y hepaticojejunostomy, cholecystectomy 10/20/2023, prior duodenal ulcer requiring coiling, who was admitted to [facility] on 11/30/2023 for SBO (small bowel obstruction) (HCC) [K56.609] AKI (acute kidney injury) (HCC) [N17.9]  #1: Acute kidney injury: Renal indices are slowly but steadily improving.  Will continue IV fluids with D5 Ringer's lactate at 100 cc an hour.  #2: Small bowel obstruction: NG tube suction placement.  Surgery following.  Spoke to the family at bedside and answered all their questions to their satisfaction. Labs and medications reviewed. Will continue to follow along with you.   LOS: 4 Lorain Childes, MD [facility] 12/28/20244:43 PM

## 2023-12-04 NOTE — Progress Notes (Signed)
CC: SBO Subjective: Wesley Francis reports doing okay today.  He is still having hiccups.  He has not passed flatus or had any bowel movements.  His NG tube is still putting out bilious although the amount is coming down.  His KUB showed contrast that progressed through the colon and decrease in caliber of small bowel diam patient Objective: Vital signs in last 24 hours: Temp:  [98.1 F (36.7 C)-98.9 F (37.2 C)] 98.2 F (36.8 C) (12/28 0735) Pulse Rate:  [64-81] 81 (12/28 0800) Resp:  [15-25] 17 (12/28 1000) BP: (123-136)/(72-107) 136/89 (12/28 1000) SpO2:  [92 %-98 %] 98 % (12/28 0800) Weight:  [60.4 kg] 60.4 kg (12/28 0400) Last BM Date : 12/01/23  Intake/Output from previous day: 12/27 0701 - 12/28 0700 In: 1979.3 [I.V.:1979.3] Out: 1200 [Urine:450; Emesis/NG output:750] Intake/Output this shift: Total I/O In: 368.2 [I.V.:338.2; NG/GT:30] Out: -   Physical exam:  There is bilious output in the canister.  His abdomen is soft, nontender and non-distended today. Lap port sites have healed well.   Lab Results: CBC  Recent Labs    12/02/23 0630 12/04/23 0502  WBC 13.7* 13.1*  HGB 12.9* 13.6  HCT 39.0 42.3  PLT 277 321   BMET Recent Labs    12/03/23 0450 12/04/23 0502  NA 141 144  K 3.4* 3.8  CL 98 107  CO2 25 22  GLUCOSE 89 91  BUN 56* 49*  CREATININE 2.01* 1.72*  CALCIUM 9.6 9.2   PT/INR No results for input(s): "LABPROT", "INR" in the last 72 hours. ABG No results for input(s): "PHART", "HCO3" in the last 72 hours.  Invalid input(s): "PCO2", "PO2"  Studies/Results: DG Abd 1 View Result Date: 12/04/2023 CLINICAL DATA:  Follow up SBO EXAM: ABDOMEN - 1 VIEW COMPARISON:  12/02/2023. FINDINGS: Small bowel dilatation present previously has somewhat improved. No radiopaque stones. Air overlies the descending colon and rectosigmoid. NG tube is partially imaged. IMPRESSION: Improving small bowel dilatation. Electronically Signed   By: Layla Maw M.D.   On:  12/04/2023 10:46   DG Abd Portable 1V-Small Bowel Obstruction Protocol-initial, 8 hr delay Result Date: 12/02/2023 CLINICAL DATA:  8 hour follow-up film EXAM: PORTABLE ABDOMEN - 1 VIEW COMPARISON:  Film from earlier in the same day. FINDINGS: Previously administered contrast now lies within the distal small bowel. Persistent small bowel dilatation is noted. Some early colonic contrast is noted as well. IMPRESSION: Persistent small bowel dilatation with passage of contrast material into the distal small bowel and proximal colon consistent with a partial small bowel obstruction. Electronically Signed   By: Alcide Clever M.D.   On: 12/02/2023 20:22   DG Abd 1 View Result Date: 12/02/2023 CLINICAL DATA:  [id number] Small bowel obstruction (HCC) [id number] EXAM: ABDOMEN - 1 VIEW COMPARISON:  X-ray abdomen 12/02/2023 8:32 a.m., CT abdomen pelvis 11/30/2023 FINDINGS: PO contrast opacifies several loops of dilated small bowel as well as the gastric lumen. Bowel suture staples noted within the right upper quadrant. No PO contrast identified within the colon. No radio-opaque calculi or other significant radiographic abnormality are seen. Vascular calcifications. IMPRESSION: PO contrast opacifies several loops of dilated small bowel as well as the gastric lumen. Electronically Signed   By: Tish Frederickson M.D.   On: 12/02/2023 18:13    Anti-infectives: Anti-infectives (From admission, onward)    None       Assessment/Plan:  The patient is a 57 year old that is approximately 6-week status post robotic assisted cholecystectomy with common bile duct exploration and  hepaticojejunostomy who came in with several day history of nausea and vomiting.  He had an NG tube for decompression.  He is not passing flatus yet.  He continues to progress slowly.  On KUB the contrast does go into the left colon and there is some air in the distal colon.  His abdomen is quite benign as well.  Given he has no bowel function I think we  need to keep the NG tube in place today.  I am hopeful that this will resolve over the next 24 to 48 hours given that contrast does go through to the colon.  I discussed with the patient and his sister that if he is still unable to tolerate p.o. intake by Monday then we may need to reconsider operative intervention.  If he is unable to eat by Monday then we will need to start on TPN.  Baker Pierini, M.D. [facility]

## 2023-12-04 NOTE — Plan of Care (Signed)

## 2023-12-05 ENCOUNTER — Other Ambulatory Visit: Payer: Self-pay

## 2023-12-05 ENCOUNTER — Inpatient Hospital Stay: Payer: MEDICAID

## 2023-12-05 DIAGNOSIS — I480 Paroxysmal atrial fibrillation: Secondary | ICD-10-CM | POA: Diagnosis not present

## 2023-12-05 DIAGNOSIS — K56609 Unspecified intestinal obstruction, unspecified as to partial versus complete obstruction: Secondary | ICD-10-CM | POA: Diagnosis not present

## 2023-12-05 DIAGNOSIS — N179 Acute kidney failure, unspecified: Secondary | ICD-10-CM | POA: Diagnosis not present

## 2023-12-05 LAB — BASIC METABOLIC PANEL
Anion gap: 11 (ref 5–15)
BUN: 39 mg/dL — ABNORMAL HIGH (ref 6–20)
CO2: 24 mmol/L (ref 22–32)
Calcium: 9.5 mg/dL (ref 8.9–10.3)
Chloride: 110 mmol/L (ref 98–111)
Creatinine, Ser: 1.46 mg/dL — ABNORMAL HIGH (ref 0.61–1.24)
GFR, Estimated: 55 mL/min — ABNORMAL LOW (ref >60)
Glucose, Bld: 125 mg/dL — ABNORMAL HIGH (ref 70–99)
Potassium: 3.8 mmol/L (ref 3.5–5.1)
Sodium: 145 mmol/L (ref 135–145)

## 2023-12-05 LAB — GLUCOSE, CAPILLARY
Glucose-Capillary: 135 mg/dL — ABNORMAL HIGH (ref 70–99)
Glucose-Capillary: 122 mg/dL — ABNORMAL HIGH (ref 70–99)

## 2023-12-05 LAB — PHOSPHORUS: Phosphorus: 2.1 mg/dL — ABNORMAL LOW (ref 2.5–4.6)

## 2023-12-05 LAB — MAGNESIUM: Magnesium: 2.4 mg/dL (ref 1.7–2.4)

## 2023-12-05 MED ORDER — SODIUM CHLORIDE 0.9% FLUSH: 10.0000 mL | INTRAVENOUS | Status: DC | PRN

## 2023-12-05 MED ORDER — SODIUM CHLORIDE 0.9% FLUSH
10.0000 mL | Freq: Two times a day (BID) | INTRAVENOUS | Status: DC
  Administered 2023-12-06: 10 mL

## 2023-12-05 MED ORDER — AMIODARONE LOAD VIA INFUSION
150.0000 mg | Freq: Once | INTRAVENOUS | Status: AC
  Administered 2023-12-05: 150 mg via INTRAVENOUS
  Filled 2023-12-05: qty 83.34

## 2023-12-05 MED ORDER — DIGOXIN 0.25 MG/ML IJ SOLN
0.2500 mg | Freq: Once | INTRAMUSCULAR | Status: AC
  Administered 2023-12-05: 0.25 mg via INTRAVENOUS
  Filled 2023-12-05: qty 2

## 2023-12-05 MED ORDER — CHLORHEXIDINE GLUCONATE CLOTH 2 % EX PADS
6.0000 | MEDICATED_PAD | Freq: Every day | CUTANEOUS | Status: DC
Start: 2023-12-05 — End: 2023-12-06
  Administered 2023-12-05 – 2023-12-06 (×2): 6 via TOPICAL

## 2023-12-05 MED ORDER — TRACE MINERALS CU-MN-SE-ZN 300-55-60-3000 MCG/ML IV SOLN
INTRAVENOUS | Status: DC
  Filled 2023-12-05: qty 1000

## 2023-12-05 MED ORDER — DILTIAZEM HCL 25 MG/5ML IV SOLN
20.0000 mg | Freq: Once | INTRAVENOUS | Status: DC
Start: 2023-12-05 — End: 2023-12-05

## 2023-12-05 MED ORDER — DILTIAZEM LOAD VIA INFUSION
15.0000 mg | Freq: Once | INTRAVENOUS | Status: AC
Start: 2023-12-05 — End: 2023-12-05
  Administered 2023-12-05: 15 mg via INTRAVENOUS
  Filled 2023-12-05: qty 15

## 2023-12-05 MED ORDER — INSULIN ASPART 100 UNIT/ML IJ SOLN
0.0000 [IU] | Freq: Four times a day (QID) | INTRAMUSCULAR | Status: DC
  Administered 2023-12-05: 1 [IU] via SUBCUTANEOUS
  Filled 2023-12-05 (×2): qty 1

## 2023-12-05 MED ORDER — AMIODARONE HCL IN DEXTROSE 360-4.14 MG/200ML-% IV SOLN
60.0000 mg/h | INTRAVENOUS | Status: AC
  Administered 2023-12-05 (×2): 60 mg/h via INTRAVENOUS
  Filled 2023-12-05 (×2): qty 200

## 2023-12-05 MED ORDER — DILTIAZEM HCL-DEXTROSE 125-5 MG/125ML-% IV SOLN (PREMIX)
5.0000 mg/h | INTRAVENOUS | Status: DC
  Administered 2023-12-05: 5 mg/h via INTRAVENOUS
  Filled 2023-12-05: qty 125

## 2023-12-05 MED ORDER — POTASSIUM PHOSPHATES 15 MMOLE/5ML IV SOLN
15.0000 mmol | Freq: Once | INTRAVENOUS | Status: AC
  Administered 2023-12-05: 15 mmol via INTRAVENOUS
  Filled 2023-12-05: qty 5

## 2023-12-05 MED ORDER — AMIODARONE HCL IN DEXTROSE 360-4.14 MG/200ML-% IV SOLN
30.0000 mg/h | INTRAVENOUS | Status: DC
Start: 2023-12-05 — End: 2023-12-06
  Administered 2023-12-06 (×2): 30 mg/h via INTRAVENOUS
  Filled 2023-12-05 (×2): qty 200

## 2023-12-05 MED ORDER — ALBUMIN HUMAN 25 % IV SOLN
25.0000 g | Freq: Once | INTRAVENOUS | Status: AC
  Administered 2023-12-05: 25 g via INTRAVENOUS
  Filled 2023-12-05: qty 100

## 2023-12-05 MED ORDER — DILTIAZEM HCL-DEXTROSE 125-5 MG/125ML-% IV SOLN (PREMIX)
5.0000 mg/h | INTRAVENOUS | Status: DC
Start: 2023-12-05 — End: 2023-12-05

## 2023-12-05 MED ORDER — DILTIAZEM LOAD VIA INFUSION
20.0000 mg | Freq: Once | INTRAVENOUS | Status: DC
  Filled 2023-12-05: qty 20

## 2023-12-05 NOTE — Consult Note (Signed)
Wesley Francis is a 57 y.o. male  [id number]  Primary Cardiologist: [facility] cardiology Reason for Consultation: Atrial fibrillation with rapid ventricular response rate  HPI: This is a 57 year old African-American male with a history of alcohol abuse hypertension status post cholecystectomy at [facility] and history of duodenal ulcers presented to the hospital with small bowel obstruction.  I was asked to evaluate the patient today because patient developed atrial fibrillation with rapid ventricular response rate.  Patient was started on diltiazem drip metoprolol and digoxin and still ventricular rate is 120.  Patient denies any chest pain but has some shortness of breath but no palpitation.   Review of Systems: No orthopnea PND or leg swelling or chest pain.   Past Medical History:  Diagnosis Date   AKI (acute kidney injury) (HCC) 04/04/2016   Anemia    Arthritis    Asthma    ETOH abuse    Folate deficiency 04/05/2016   Heme positive stool 04/05/2016   Hypertension     Medications Prior to Admission  Medication Sig Dispense Refill   amLODipine (NORVASC) 5 MG tablet Take 5 mg by mouth daily.     Cholecalciferol (VITAMIN D3) 50 MCG (2000 UT) capsule Take by mouth.     Cholecalciferol 1.25 MG (50000 UT) TABS Take 1 capsule by mouth every other day.     folic acid (FOLVITE) 1 MG tablet Take 1 mg by mouth every morning.     pantoprazole (PROTONIX) 40 MG tablet Take 40 mg by mouth 2 (two) times daily.     albuterol (PROVENTIL) (2.5 MG/3ML) 0.083% nebulizer solution Take 3 mLs (2.5 mg total) by nebulization every 6 (six) hours as needed for wheezing or shortness of breath. (Patient not taking: Reported on 08/12/2023)        enoxaparin (LOVENOX) injection  40 mg Subcutaneous Q24H   pantoprazole (PROTONIX) IV  40 mg Intravenous Q12H    Infusions:  .TPN (CLINIMIX-E) Adult     amiodarone 60 mg/hr (12/05/23 1202)   Followed by   amiodarone     dextrose 5% lactated  ringers 100 mL/hr at 12/05/23 0938   diltiazem (CARDIZEM) infusion 5 mg/hr (12/05/23 0757)   potassium PHOSPHATE IVPB (in mmol)      No Known Allergies  Social History   Socioeconomic History   Marital status: Single    Spouse name: Not on file   Number of children: Not on file   Years of education: Not on file   Highest education level: Not on file  Occupational History   Not on file  Tobacco Use   Smoking status: Every Day    Current packs/day: 0.50    Types: Cigarettes    Passive exposure: Current   Smokeless tobacco: Never  Vaping Use   Vaping status: Never Used  Substance and Sexual Activity   Alcohol use: Not Currently    Comment: 24 oz beer daily   Drug use: No   Sexual activity: Not on file  Other Topics Concern   Not on file  Social History Narrative   Not on file   Social Drivers of Health   Financial Resource Strain: Not on file  Food Insecurity: No Food Insecurity (12/01/2023)   Hunger Vital Sign    Worried About Running Out of Food in the Last Year: Never true    Ran Out of Food in the Last Year: Never true  Transportation Needs: No Transportation Needs (12/01/2023)   PRAPARE - Transportation  Lack of Transportation (Medical): No    Lack of Transportation (Non-Medical): No  Physical Activity: Not on file  Stress: Not on file  Social Connections: Not on file  Intimate Partner Violence: Not At Risk (12/01/2023)   Humiliation, Afraid, Rape, and Kick questionnaire    Fear of Current or Ex-Partner: No    Emotionally Abused: No    Physically Abused: No    Sexually Abused: No    Family History  Problem Relation Age of Onset   Hypertension Mother    Stroke Mother    Hypertension Father    Colon cancer Father        31s   Colon polyps Sister        69s   Colon cancer Paternal Uncle        48s   Colon cancer Paternal Uncle        72s   Colon cancer Paternal Uncle        54s    PHYSICAL EXAM: Vitals:   12/05/23 1245 12/05/23 1250  BP:  (!) 85/74 106/76  Pulse:    Resp: 19 20  Temp:    SpO2:       Intake/Output Summary (Last 24 hours) at 12/05/2023 1312 Last data filed at 12/05/2023 0750 Gross per 24 hour  Intake 1740.66 ml  Output 1800 ml  Net -59.34 ml    General:  Well appearing. No respiratory difficulty HEENT: normal Neck: supple. no JVD. Carotids 2+ bilat; no bruits. No lymphadenopathy or thryomegaly appreciated. Cor: PMI nondisplaced. Regular rate & rhythm. No rubs, gallops or murmurs. Lungs: clear Abdomen: soft, nontender, nondistended. No hepatosplenomegaly. No bruits or masses. Good bowel sounds. Extremities: no cyanosis, clubbing, rash, edema Neuro: alert & oriented x 3, cranial nerves grossly intact. moves all 4 extremities w/o difficulty. Affect pleasant.  ECG: Atrial fibrillation with rapid ventricular response rate right bundle branch block and nonspecific ST-T changes  Results for orders placed or performed during the hospital encounter of 11/30/23 (from the past 24 hours)  Glucose, capillary     Status: Abnormal   Collection Time: 12/05/23  7:21 AM  Result Value Ref Range   Glucose-Capillary 135 (H) 70 - 99 mg/dL  Basic metabolic panel     Status: Abnormal   Collection Time: 12/05/23  7:47 AM  Result Value Ref Range   Sodium 145 135 - 145 mmol/L   Potassium 3.8 3.5 - 5.1 mmol/L   Chloride 110 98 - 111 mmol/L   CO2 24 22 - 32 mmol/L   Glucose, Bld 125 (H) 70 - 99 mg/dL   BUN 39 (H) 6 - 20 mg/dL   Creatinine, Ser 6.96 (H) 0.61 - 1.24 mg/dL   Calcium 9.5 8.9 - 29.5 mg/dL   GFR, Estimated 55 (L) >60 mL/min   Anion gap 11 5 - 15  Magnesium     Status: None   Collection Time: 12/05/23  7:47 AM  Result Value Ref Range   Magnesium 2.4 1.7 - 2.4 mg/dL  Phosphorus     Status: Abnormal   Collection Time: 12/05/23  7:47 AM  Result Value Ref Range   Phosphorus 2.1 (L) 2.5 - 4.6 mg/dL   [place] EKG SITE RITE Result Date: 12/05/2023 If Site Rite image not attached, placement could not be  confirmed due to current cardiac rhythm.  [place] EKG SITE RITE Result Date: 12/05/2023 If Site Rite image not attached, placement could not be confirmed due to current cardiac rhythm.  DG Abd 1 View  Result Date: 12/04/2023 CLINICAL DATA:  Follow up SBO EXAM: ABDOMEN - 1 VIEW COMPARISON:  12/02/2023. FINDINGS: Small bowel dilatation present previously has somewhat improved. No radiopaque stones. Air overlies the descending colon and rectosigmoid. NG tube is partially imaged. IMPRESSION: Improving small bowel dilatation. Electronically Signed   By: Layla Maw M.D.   On: 12/04/2023 10:46     ASSESSMENT AND PLAN: Atrial fibrillation with rapid ventricular response rate.  Patient has prior history of paroxysmal atrial fibrillation with onset few months back when patient had cholecystectomy at [facility].  Discussing with the wife patient was in sinus rhythm prior to this hospitalization.  Today patient is still in A-fib with rapid ventricular response rate.  Advise adding amiodarone IV and along with bolus and continue Cardizem drip in the meantime as long as blood pressure can tolerate.  Patient received 0.25 digoxin this morning also.  May give another dose of 0.25 digoxin if the ventricular rate continues to be over 120.  Will get echocardiogram and make further recommendation.  Because of significant GI issues and history of GI bleed along with small bowel obstruction and peptic ulcer disease hold off on anticoagulation at this time.  Shiva Karis Welton Flakes

## 2023-12-05 NOTE — Progress Notes (Signed)
°   12/05/23 1610 12/05/23 9604  Provider Notification  Provider Name/Title B. Jon Billings NP B. Jon Billings NP  Date Provider Notified 12/05/23 12/05/23  Time Provider Notified [phone]  Method of Notification Page Page  Notification Reason New onset of dysrhythmia New onset of dysrhythmia  Type of New Onset of Dysrhythmia Atrial fibrillation Atrial fibrillation  Symptoms of New Onset of Dysrhythmia Asymptomatic Asymptomatic    Patient ambulated with RN around the unit with no complaints at that time, when arrived back to patients room, patients HR was 170-180s, patient with no complaints at that time, patient then appeared to convert to afib on bedside monitor, RN obtained EKG which showed patient to be in afib, RN paged provider on call.  New orders received

## 2023-12-05 NOTE — Progress Notes (Signed)
PHARMACY - TOTAL PARENTERAL NUTRITION CONSULT NOTE   Indication: Small bowel obstruction  Patient Measurements: Height: 5\' 6"  (167.6 cm) Weight: 60.4 kg (133 lb 2.5 oz) IBW/kg (Calculated) : 63.8 TPN AdjBW (KG): 70.3 Body mass index is 21.49 kg/m. Usual Weight:    Assessment: 57 yo M w/ SBO 6 weeks out from a major hepaticojejunostomy with a Roux-en-Y reconstruction. 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  PMHx of alcohol abuse, hypertension   Glucose / Insulin: No noted hx of diabetes sSSI q6h Electrolytes: Phos 2.1 MD ordered Kphos 15 mmol IV x 1 12/29 Renal: Scr 1.46 Hepatic:  Intake / Output; MIVF: D5LR@ 100 ml/hr GI Imaging:KUB pers reviewed showing persistent SB dilation, He did have gastrografin challenge showing contract in he colon  GI Surgeries / Procedures:   Central access: to get PICC 12/29 TPN start date:  12/29   Nutritional Goals: Goal TPN rate is 42 mL/hr (provides 80 g of protein and 664 kcals per day)  RD Assessment:  PENDING    Current Nutrition:  NPO  Plan:  Start Clinimix E 8/10 at 42 mL/hr at 1800 -assess for initiation of lipids   Electrolytes in TPN: Na 23mEq/L, K 63mEq/L, Ca 27mEq/L, Mg 7mEq/L, and Phos 98mmol/L. Cl:Ac 1:1 -Phos 2.1  MD ordered Kphos 15 mmol IV x 1 12/29 -watch for refeeding, hx ETOH  Add standard MVI and trace elements to TPN Initiate Sensitive q6h SSI and adjust as needed  -F/u MIVF-D5LR at  100 mL/hr per nephrology (AKI)- assess for rate reduction after TPN initiated Monitor TPN labs on Mon/Thurs, Daily for first 3 days or per assessment  Bari Mantis PharmD Clinical Pharmacist 12/05/2023

## 2023-12-05 NOTE — Progress Notes (Signed)
Peripherally Inserted Central Catheter Placement  The IV Nurse has discussed with the patient and/or persons authorized to consent for the patient, the purpose of this procedure and the potential benefits and risks involved with this procedure.  The benefits include less needle sticks, lab draws from the catheter, and the patient may be discharged home with the catheter. Risks include, but not limited to, infection, bleeding, blood clot (thrombus formation), and puncture of an artery; nerve damage and irregular heartbeat and possibility to perform a PICC exchange if needed/ordered by physician.  Alternatives to this procedure were also discussed.  Bard Power PICC patient education guide, fact sheet on infection prevention and patient information card has been provided to patient /or left at bedside.    PICC Placement Documentation  PICC Triple Lumen 12/05/23 Left Brachial 44 cm 0 cm (Active)  Indication for Insertion or Continuance of Line Vasoactive infusions;Administration of hyperosmolar/irritating solutions (i.e. TPN, Vancomycin, etc.) 12/05/23 1905  Exposed Catheter (cm) 0 cm 12/05/23 1905  Site Assessment Clean, Dry, Intact 12/05/23 1905  Lumen #1 Status Flushed;Saline locked;Blood return noted 12/05/23 1905  Lumen #2 Status Flushed;Saline locked;Blood return noted 12/05/23 1905  Lumen #3 Status Flushed;Saline locked;Blood return noted 12/05/23 1905  Dressing Type Transparent;Securing device 12/05/23 1905  Dressing Status Antimicrobial disc in place;Clean, Dry, Intact 12/05/23 1905  Line Care Connections checked and tightened 12/05/23 1905  Line Adjustment (NICU/IV Team Only) No 12/05/23 1905  Dressing Intervention New dressing;Adhesive placed at insertion site (IV team only);Adhesive placed around edges of dressing (IV team/ICU RN only) 12/05/23 1905  Dressing Change Due 12/12/23 12/05/23 1905       Elliot Dally 12/05/2023, 7:06 PM

## 2023-12-05 NOTE — Progress Notes (Addendum)
Progress Note   Patient: Wesley Francis:[id number] DOB: Oct 24, 1966 DOA: 11/30/2023     5 DOS: the patient was seen and examined on 12/05/2023   Brief hospital course: RONAN COCHELL is a 57 y.o. 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  12/29.  Patient developed atrial fibrillation with RVR.  Started on diltiazem drip, given digoxin and metoprolol.   Principal Problem:   SBO (small bowel obstruction) (HCC) Active Problems:   Alcohol abuse   Tobacco abuse   AKI (acute kidney injury) (HCC)   History of duodenal ulcer   GERD (gastroesophageal reflux disease)   COPD (chronic obstructive pulmonary disease) (HCC)   Hyponatremia   Choledocholithiasis   Elevated troponin   Asthma, chronic   Paroxysmal atrial fibrillation with RVR (HCC)   Assessment and Plan: Paroxysmal atrial fibrillation with RVR. Patient developed atrial fibrillation with RVR in the morning of 12/29.  Did not have any chest pain or shortness of breath.  Patient was given IV metoprolol, digoxin, and diltiazem drip.  Reviewed recent echocardiogram performed on 03/2022, EF was 65 to 70% with no significant valvular disease. Per patient sister, patient has a still listed 2 episodes of atrial fibrillation in the past. Cardiology consult is obtained.  But will hold off anticoagulation for now until decision is made about small bowel obstruction. 1346. 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 Repeated KUB did not show any improvement, patient is followed by general surgery, small bowel follow-through showed a Gastrografin has now passed into colon.  Patient NG output finally improved on 12/29.  Discussed with general surgery, Dr. Everlene Farrier will see patient again today to evaluate to see if patient will need NG suction, where the patient can start liquid diet.  If not, I will place PICC line and start TPN, renal function has much improved.   Lactic acidosis. Initial lactic acid level was 3.1, followed by 9.0, then dropped back to 2.4.  9.0 is not consistent with clinical picture, most likely due to lab error. Patient currently does not have any abdominal rebound tenderness, abdominal pain seems to be better.  Not consistent with bowel ischemia.   Acute kidney injury. Hyponatremia. Hypokalemia. Hypophosphatemia. Patient prior renal function was normal, creatinine went up to 5.01 today.  Renal ultrasound did not show any obstruction, but did show evidence of chronic kidney disease. Creatinine dropped down to 1.46, phosphorus level 2.1, will give a dose of potassium phosphate.   Alcohol abuse Tobacco abuse No evidence of alcohol withdrawal.   Asthma, chronic Stable.    Elevated troponin Appear to be due to acute renal failure.   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    Subjective:  Patient developed atrial fibrillation earlier this morning, denies any chest pain shortness of breath. Denies any abdominal pain, no nausea vomiting.  Physical Exam: Vitals:   12/05/23 1000 12/05/23 1015 12/05/23 1030 12/05/23 1046  BP: 102/81 118/83 (!) 155/121 (!) 124/112  Pulse: 74 75 (!) 129 (!) 104  Resp: 20 20 18 18   Temp:      TempSrc:      SpO2: 93% 92% 94% 95%  Weight:      Height:       General exam: Appears calm and comfortable  Respiratory system: Clear to auscultation. Respiratory effort normal. Cardiovascular system: Irregularly irregular, tachycardic. No JVD, murmurs, rubs, gallops or clicks. No pedal edema. Gastrointestinal system: Abdomen is nondistended, soft and nontender. No organomegaly or masses felt. Normal bowel sounds heard. Central nervous system: Alert and oriented. No focal neurological deficits. Extremities: Symmetric 5 x 5 power. Skin: No rashes, lesions or ulcers Psychiatry: Judgement and insight appear normal. Mood & affect appropriate.    Data Reviewed:  Lab results reviewed.  Family Communication: Sister updated at bedside.  Disposition: Status is: Inpatient Remains inpatient appropriate because: Severity of disease, IV treatment, possible surgical intervention.     Time spent: 55 minutes  Author: Marrion Coy, MD 12/05/2023 10:53 AM  For on call review [email or web address].

## 2023-12-05 NOTE — Progress Notes (Signed)
Dr. Chipper Herb gave order to discontinue 20 mg IV Cardizem push.

## 2023-12-05 NOTE — Progress Notes (Signed)
Secure chat sent to Dr Everlene Farrier re assessment of RUE with swelling present mid upper arm distally to hand.  Noted artifact within the brachial/basilic vein which was also noncompressible.  Pulses strong and temperature equal to LUE.  PICC placed in LUE due to assessment.  Recommendations for RUE doppler.

## 2023-12-05 NOTE — Progress Notes (Signed)
CC: SBO Subjective: Wesley Francis. Bleazard is a 57 y.o. 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 Wesley Francis developed bleeding from the GDA requiring embolization in 03/2022. The bleeding was secondary to duodenal peptic ulcer disease  Wesley Francis did have biliary obstruction that required  robotic cholecystectomy with common bile duct exploration and Roux and Y hepaticojejunostomy approximately 5 weeks ago.  Denies any flatus  KUB pers reviewed showing persistent SB dilation, Wesley Francis did have gastrografin challenge showing contract in Wesley Francis colon   Objective: Vital signs in last 24 hours: Temp:  [97.5 F (36.4 C)-98.3 F (36.8 C)] 97.9 F (36.6 C) (12/29 1134) Pulse Rate:  [52-138] 81 (12/29 1515) Resp:  [13-22] 16 (12/29 1515) BP: (85-155)/(70-121) 119/79 (12/29 1515) SpO2:  [92 %-100 %] 97 % (12/29 1515) Last BM Date : 12/01/23  Intake/Output from previous day: 12/28 0701 - 12/29 0700 In: 1062.9 [I.V.:942.9; NG/GT:120] Out: 1850 [Urine:300; Emesis/NG output:1550] Intake/Output this shift: Total I/O In: 2070.3 [I.V.:2070.3] Out: 450 [Emesis/NG output:450]  Physical exam: NAD alert ABd: soft but mildly distended, non tender w/o peritonitis  Lab Results: CBC  Recent Labs    12/04/23 0502  WBC 13.1*  HGB 13.6  HCT 42.3  PLT 321   BMET Recent Labs    12/04/23 0502 12/05/23 0747  NA 144 145  K 3.8 3.8  CL 107 110  CO2 22 24  GLUCOSE 91 125*  BUN 49* 39*  CREATININE 1.72* 1.46*  CALCIUM 9.2 9.5   PT/INR No results for input(s): "LABPROT", "INR" in the last 72 hours. ABG No results for input(s): "PHART", "HCO3" in the last 72 hours.  Invalid input(s): "PCO2", "PO2"  Studies/Results: DG ABD ACUTE 2+V W 1V CHEST Result Date: 12/05/2023 CLINICAL DATA:  Ileus following gastrointestinal surgery EXAM: DG ABDOMEN ACUTE WITH 1 VIEW CHEST COMPARISON:  12/04/2023 at 8:03 a.m. FINDINGS: Enteric tube looped in the gastric fundus with tip near the GE  junction. Side port is within the stomach. Similar small bowel dilation in the central abdomen. Heart size and mediastinal contours are within normal limits. Both lungs are clear. IMPRESSION: 1. Enteric tube looped in the gastric fundus with tip near the GE junction. Side port is within the stomach. 2. Persistent small bowel dilation in the central abdomen. Electronically Signed   By: Minerva Fester M.D.   On: 12/05/2023 14:00   [place] EKG SITE RITE Result Date: 12/05/2023 If Site Rite image not attached, placement could not be confirmed due to current cardiac rhythm.  [place] EKG SITE RITE Result Date: 12/05/2023 If Site Rite image not attached, placement could not be confirmed due to current cardiac rhythm.  DG Abd 1 View Result Date: 12/04/2023 CLINICAL DATA:  Follow up SBO EXAM: ABDOMEN - 1 VIEW COMPARISON:  12/02/2023. FINDINGS: Small bowel dilatation present previously has somewhat improved. No radiopaque stones. Air overlies the descending colon and rectosigmoid. NG tube is partially imaged. IMPRESSION: Improving small bowel dilatation. Electronically Signed   By: Layla Maw M.D.   On: 12/04/2023 10:46    Anti-infectives: Anti-infectives (From admission, onward)    None       Assessment/Plan:  SBO difficult situation.  Wesley Francis has not resolved.  I do think that Wesley Francis is between a rock and a hard place.  Wesley Francis is 6 weeks out from a major hepaticojejunostomy with a Roux-en-Y reconstruction.  Usually is the worst timing for operative intervention given adhesive disease. We will continue NG decompression, TPN and serial exams.  We may have to repeat CT with p.o. contrast tomorrow. If there is a stricture at the jejunojejunal anastomosis may need reintervention and probably be best done at a place with hepatobiliary expertise given his complex surgical history. Extensive discussion with the patient and the family about my thought process.  At this time Wesley Francis is not peritonitic nor toxic and does not  need emergent intervention. I spent greater than 50 minutes in this encounter including extensive review of medical records, extensive review of imaging studies, coordinating his care placing orders and performing documentation  Sterling Big, MD, [facility]  12/05/2023

## 2023-12-05 NOTE — Progress Notes (Signed)
Dr. Welton Flakes at bedside and gave order for amiodarone bolus and drip.

## 2023-12-05 NOTE — Progress Notes (Signed)
[facility]  PROGRESS NOTE   Subjective:   Patient seen at bedside.  Out of bed to chair.  NG tube suction is still in progress.  Objective:  Vital signs: Blood pressure 128/82, pulse 76, temperature 97.9 F (36.6 C), temperature source Oral, resp. rate 17, height 5\' 6"  (1.676 m), weight 60.4 kg, SpO2 94%.  Intake/Output Summary (Last 24 hours) at 12/05/2023 1414 Last data filed at 12/05/2023 1400 Gross per 24 hour  Intake 2438.38 ml  Output 1800 ml  Net 638.38 ml   Filed Weights   12/01/23 1502 12/04/23 0400  Weight: 70.3 kg 60.4 kg     Physical Exam: General:  No acute distress  Head:  Normocephalic, atraumatic. Moist oral mucosal membranes  Eyes:  Anicteric  Neck:  Supple  Lungs:   Clear to auscultation, normal effort  Heart:  S1S2 no rubs  Abdomen:   Soft, nontender, bowel sounds absent.  Extremities:  peripheral edema.  Neurologic:  Awake, alert, following commands  Skin:  No lesions  Access:     Basic Metabolic Panel: Recent Labs  Lab 12/01/23 0504 12/02/23 0630 12/03/23 0450 12/04/23 0502 12/05/23 0747  NA 134* 133* 141 144 145  K 4.2 3.2* 3.4* 3.8 3.8  CL 90* 94* 98 107 110  CO2 27 26 25 22 24   GLUCOSE 122* 95 89 91 125*  BUN 62* 62* 56* 49* 39*  CREATININE 5.01* 2.94* 2.01* 1.72* 1.46*  CALCIUM 9.1 8.7* 9.6 9.2 9.5  MG  --   --  2.6*  --  2.4  PHOS  --  3.8  --   --  2.1*   GFR: Estimated Creatinine Clearance: 47.1 mL/min (A) (by C-G formula based on SCr of 1.46 mg/dL (H)).  Liver Function Tests: Recent Labs  Lab 11/30/23 1550 12/01/23 0504  AST 44* 36  ALT 20 23  ALKPHOS 48 35*  BILITOT 1.1 1.3*  PROT 10.1* 7.6  ALBUMIN 5.7* 4.1   Recent Labs  Lab 11/30/23 1550  LIPASE 30   No results for input(s): "AMMONIA" in the last 168 hours.  CBC: Recent Labs  Lab 11/30/23 1550 12/01/23 0504 12/02/23 0630 12/04/23 0502  WBC 15.6* 17.1* 13.7* 13.1*  NEUTROABS 13.4*  --   --   --   HGB 16.2 14.2 12.9* 13.6  HCT 50.3  42.5 39.0 42.3  MCV 75.3* 74.3* 73.6* 75.5*  PLT 319 269 277 321     HbA1C: Hgb A1c MFr Bld  Date/Time Value Ref Range Status  12/20/2017 06:14 PM 4.9 4.8 - 5.6 % Final    Comment:    (NOTE) Pre diabetes:          5.7%-6.4% Diabetes:              >6.4% Glycemic control for   <7.0% adults with diabetes     Urinalysis: No results for input(s): "COLORURINE", "LABSPEC", "PHURINE", "GLUCOSEU", "HGBUR", "BILIRUBINUR", "KETONESUR", "PROTEINUR", "UROBILINOGEN", "NITRITE", "LEUKOCYTESUR" in the last 72 hours.  Invalid input(s): "APPERANCEUR"    Imaging: DG ABD ACUTE 2+V W 1V CHEST Result Date: 12/05/2023 CLINICAL DATA:  Ileus following gastrointestinal surgery EXAM: DG ABDOMEN ACUTE WITH 1 VIEW CHEST COMPARISON:  12/04/2023 at 8:03 a.m. FINDINGS: Enteric tube looped in the gastric fundus with tip near the GE junction. Side port is within the stomach. Similar small bowel dilation in the central abdomen. Heart size and mediastinal contours are within normal limits. Both lungs are clear. IMPRESSION: 1. Enteric tube looped in the gastric fundus with  tip near the GE junction. Side port is within the stomach. 2. Persistent small bowel dilation in the central abdomen. Electronically Signed   By: Minerva Fester M.D.   On: 12/05/2023 14:00   [place] EKG SITE RITE Result Date: 12/05/2023 If Site Rite image not attached, placement could not be confirmed due to current cardiac rhythm.  [place] EKG SITE RITE Result Date: 12/05/2023 If Site Rite image not attached, placement could not be confirmed due to current cardiac rhythm.  DG Abd 1 View Result Date: 12/04/2023 CLINICAL DATA:  Follow up SBO EXAM: ABDOMEN - 1 VIEW COMPARISON:  12/02/2023. FINDINGS: Small bowel dilatation present previously has somewhat improved. No radiopaque stones. Air overlies the descending colon and rectosigmoid. NG tube is partially imaged. IMPRESSION: Improving small bowel dilatation. Electronically Signed   By: Layla Maw  M.D.   On: 12/04/2023 10:46     Medications:    .TPN (CLINIMIX-E) Adult     amiodarone 60 mg/hr (12/05/23 1400)   Followed by   amiodarone     dextrose 5% lactated ringers 100 mL/hr at 12/05/23 1400   potassium PHOSPHATE IVPB (in mmol)      enoxaparin (LOVENOX) injection  40 mg Subcutaneous Q24H   pantoprazole (PROTONIX) IV  40 mg Intravenous Q12H    Assessment/ Plan:     [name] is a 57 y.o.  male with a PMHx of alcohol abuse, hypertension, history of complicated choledocholithiasis including choledocho duodenal fistula, Roux-en-Y hepaticojejunostomy, cholecystectomy 10/20/2023, prior duodenal ulcer requiring coiling, who was [facility] to [facility]/30/2023 for SBO (small bowel obstruction) (HCC) [K56.609] AKI (acute kidney injury) (HCC) [N17.9]   #1: Acute kidney injury: Renal indices are slowly but steadily improving.  Will continue IV fluids with D5 Ringer's lactate at 100 cc an hour.   #2: Small bowel obstruction: NG tube suction placement.  Surgery following.  Started on TPN.  #3: Hypophosphatemia/hypokalemia: Supplement potassium phosphate as ordered.   Spoke to the family at bedside and answered all their questions to their satisfaction.  Labs and medications reviewed. Will continue to follow along with you.   LOS: 5 Lorain Childes, MD [facility] 12/29/20242:14 PM

## 2023-12-06 ENCOUNTER — Inpatient Hospital Stay: Payer: MEDICAID

## 2023-12-06 DIAGNOSIS — N179 Acute kidney failure, unspecified: Secondary | ICD-10-CM | POA: Diagnosis not present

## 2023-12-06 DIAGNOSIS — I480 Paroxysmal atrial fibrillation: Secondary | ICD-10-CM | POA: Diagnosis not present

## 2023-12-06 DIAGNOSIS — K56609 Unspecified intestinal obstruction, unspecified as to partial versus complete obstruction: Secondary | ICD-10-CM | POA: Diagnosis not present

## 2023-12-06 DIAGNOSIS — J449 Chronic obstructive pulmonary disease, unspecified: Secondary | ICD-10-CM | POA: Diagnosis not present

## 2023-12-06 LAB — COMPREHENSIVE METABOLIC PANEL
ALT: 41 U/L (ref 0–44)
AST: 51 U/L — ABNORMAL HIGH (ref 15–41)
Albumin: 4.8 g/dL (ref 3.5–5.0)
Alkaline Phosphatase: 54 U/L (ref 38–126)
Anion gap: 12 (ref 5–15)
BUN: 34 mg/dL — ABNORMAL HIGH (ref 6–20)
CO2: 27 mmol/L (ref 22–32)
Calcium: 10.1 mg/dL (ref 8.9–10.3)
Chloride: 104 mmol/L (ref 98–111)
Creatinine, Ser: 1.46 mg/dL — ABNORMAL HIGH (ref 0.61–1.24)
GFR, Estimated: 55 mL/min — ABNORMAL LOW (ref >60)
Glucose, Bld: 94 mg/dL (ref 70–99)
Potassium: 3.6 mmol/L (ref 3.5–5.1)
Sodium: 143 mmol/L (ref 135–145)
Total Bilirubin: 0.9 mg/dL (ref <1.2)
Total Protein: 8.8 g/dL — ABNORMAL HIGH (ref 6.5–8.1)

## 2023-12-06 LAB — TRIGLYCERIDES: Triglycerides: 110 mg/dL (ref <150)

## 2023-12-06 LAB — MAGNESIUM: Magnesium: 2.3 mg/dL (ref 1.7–2.4)

## 2023-12-06 LAB — GLUCOSE, CAPILLARY
Glucose-Capillary: 104 mg/dL — ABNORMAL HIGH (ref 70–99)
Glucose-Capillary: 120 mg/dL — ABNORMAL HIGH (ref 70–99)

## 2023-12-06 LAB — PHOSPHORUS: Phosphorus: 4.2 mg/dL (ref 2.5–4.6)

## 2023-12-06 MED ORDER — IOHEXOL 9 MG/ML PO SOLN: 500.0000 mL | ORAL | Status: DC

## 2023-12-06 MED ORDER — ONDANSETRON HCL 4 MG/2ML IJ SOLN: 4.0000 mg | Freq: Four times a day (QID) | INTRAMUSCULAR | Status: DC | PRN

## 2023-12-06 MED ORDER — INSULIN ASPART 100 UNIT/ML IJ SOLN: 0.0000 [IU] | Freq: Four times a day (QID) | INTRAMUSCULAR | Status: DC

## 2023-12-06 MED ORDER — ENOXAPARIN SODIUM 40 MG/0.4ML IJ SOSY: 40.0000 mg | PREFILLED_SYRINGE | INTRAMUSCULAR | Status: DC

## 2023-12-06 MED ORDER — TRACE MINERALS CU-MN-SE-ZN 300-55-60-3000 MCG/ML IV SOLN
INTRAVENOUS | Status: DC
  Filled 2023-12-06: qty 1000

## 2023-12-06 MED ORDER — AMIODARONE HCL IN DEXTROSE 360-4.14 MG/200ML-% IV SOLN: 30.0000 mg/h | INTRAVENOUS | Status: DC

## 2023-12-06 MED ORDER — CHLORHEXIDINE GLUCONATE CLOTH 2 % EX PADS: 6.0000 | MEDICATED_PAD | Freq: Every day | CUTANEOUS | Status: DC

## 2023-12-06 MED ORDER — PANTOPRAZOLE SODIUM 40 MG IV SOLR: 40.0000 mg | Freq: Two times a day (BID) | INTRAVENOUS | Status: DC

## 2023-12-06 MED ORDER — FAT EMUL FISH OIL/PLANT BASED 20% (SMOFLIPID)IV EMUL: 250.0000 mL | INTRAVENOUS | Status: DC

## 2023-12-06 MED ORDER — THIAMINE HCL 100 MG/ML IJ SOLN
100.0000 mg | Freq: Every day | INTRAMUSCULAR | Status: DC
  Administered 2023-12-06: 100 mg via INTRAVENOUS
  Filled 2023-12-06: qty 2

## 2023-12-06 MED ORDER — PROCHLORPERAZINE EDISYLATE 10 MG/2ML IJ SOLN: 10.0000 mg | Freq: Three times a day (TID) | INTRAMUSCULAR | Status: DC | PRN

## 2023-12-06 MED ORDER — IOHEXOL 9 MG/ML PO SOLN
500.0000 mL | ORAL | Status: AC
  Administered 2023-12-06 (×2): 500 mL

## 2023-12-06 MED ORDER — FAT EMUL FISH OIL/PLANT BASED 20% (SMOFLIPID)IV EMUL
250.0000 mL | INTRAVENOUS | Status: DC
  Filled 2023-12-06: qty 250

## 2023-12-06 MED ORDER — IOHEXOL 300 MG/ML  SOLN
100.0000 mL | Freq: Once | INTRAMUSCULAR | Status: AC | PRN
  Administered 2023-12-06: 100 mL via INTRAVENOUS

## 2023-12-06 NOTE — Progress Notes (Signed)
[facility] CLINIC CARDIOLOGY PROGRESS NOTE   Patient ID: Wesley Francis MRN: [id number] DOB/AGE: 08-05-1966 57 y.o.  Admit date: 11/30/2023 Referring Physician Dr. Marrion Coy Primary Physician Allison Quarry, Glenice Laine, FNP  Primary Cardiologist Dr. Melton Alar Reason for Consultation AF RVR  HPI: Wesley Francis is a 57 y.o. male with a past medical history of paroxysmal atrial fibrillation, hypertension, alcohol use who presented to the ED on 11/30/2023 for emesis. Developed AF RVR over the weekend and cardiology was consulted.   Interval History:  -Patient states he is feeling well overall today.  -Patient denies any chest pain, SOB. Also denies palpitations.  -BP and HR controlled. Remaining in NSR.   Review of systems complete and found to be negative unless listed above    Vitals:   12/06/23 0900 12/06/23 0913 12/06/23 1000 12/06/23 1130  BP: 120/85   (!) 110/90  Pulse:      Resp: 15  17 17   Temp:  98 F (36.7 C)  97.6 F (36.4 C)  TempSrc:    Oral  SpO2:  94%  100%  Weight:      Height:         Intake/Output Summary (Last 24 hours) at 12/06/2023 1207 Last data filed at 12/06/2023 1000 Gross per 24 hour  Intake 2940.63 ml  Output 3225 ml  Net -284.37 ml     PHYSICAL EXAM General: Ill appearing male, well nourished, in no acute distress. HEENT: Normocephalic and atraumatic. Neck: No JVD.  Lungs: Normal respiratory effort on room air. Clear bilaterally to auscultation. No wheezes, crackles, rhonchi.  Heart: HRRR. Normal S1 and S2 without gallops or murmurs. Radial & DP pulses 2+ bilaterally. Abdomen: Non-distended appearing.  Msk: Normal strength and tone for age. Extremities: No clubbing, cyanosis or edema.   Neuro: Alert and oriented X 3. Psych: Mood appropriate, affect congruent.    LABS: Basic Metabolic Panel: Recent Labs    12/05/23 0747 12/06/23 0527  NA 145 143  K 3.8 3.6  CL 110 104  CO2 24 27  GLUCOSE 125* 94  BUN 39* 34*  CREATININE 1.46*  1.46*  CALCIUM 9.5 10.1  MG 2.4 2.3  PHOS 2.1* 4.2   Liver Function Tests: Recent Labs    12/06/23 0527  AST 51*  ALT 41  ALKPHOS 54  BILITOT 0.9  PROT 8.8*  ALBUMIN 4.8   No results for input(s): "LIPASE", "AMYLASE" in the last 72 hours. CBC: Recent Labs    12/04/23 0502  WBC 13.1*  HGB 13.6  HCT 42.3  MCV 75.5*  PLT 321   Cardiac Enzymes: No results for input(s): "CKTOTAL", "CKMB", "CKMBINDEX", "TROPONINIHS" in the last 72 hours. BNP: No results for input(s): "BNP" in the last 72 hours. D-Dimer: No results for input(s): "DDIMER" in the last 72 hours. Hemoglobin A1C: No results for input(s): "HGBA1C" in the last 72 hours. Fasting Lipid Panel: Recent Labs    12/06/23 0527  TRIG 110   Thyroid Function Tests: No results for input(s): "TSH", "T4TOTAL", "T3FREE", "THYROIDAB" in the last 72 hours.  Invalid input(s): "FREET3" Anemia Panel: No results for input(s): "VITAMINB12", "FOLATE", "FERRITIN", "TIBC", "IRON", "RETICCTPCT" in the last 72 hours.  CT ABDOMEN PELVIS W CONTRAST Result Date: 12/06/2023 CLINICAL DATA:  Bowel obstruction suspected. EXAM: CT ABDOMEN AND PELVIS WITH CONTRAST TECHNIQUE: Multidetector CT imaging of the abdomen and pelvis was performed using the standard protocol following bolus administration of intravenous contrast. RADIATION DOSE REDUCTION: This exam was performed according to the departmental dose-optimization program  which includes automated exposure control, adjustment of the mA and/or kV according to patient size and/or use of iterative reconstruction technique. CONTRAST:  OMNIPAQUE IOHEXOL 300 MG/ML  SOLN COMPARISON:  CT scan abdomen and pelvis from 11/30/2023. FINDINGS: Lower chest: There are patchy atelectatic changes in the visualized lung bases. No overt consolidation. No pleural effusion. The heart is normal in size. No pericardial effusion. Hepatobiliary: The liver is normal in size. Non-cirrhotic configuration. No suspicious  mass. There is pneumobilia in the left hepatic lobe. Left branch of the portal vein is not visualized. No intrahepatic or extrahepatic bile duct dilation. Gallbladder is surgically absent. Pancreas: Unremarkable. No pancreatic ductal dilatation or surrounding inflammatory changes. Redemonstration of embolization coils in the pancreatic head region. Spleen: Spleen is enlarged measuring upto cm orthogonally on coronal plane. No focal lesion. Adrenals/Urinary Tract: Adrenal glands are unremarkable. No suspicious renal mass. Redemonstration of multiple simple cysts in bilateral kidneys with largest arising from the right kidney upper pole measuring 1.7 x 1.8 cm. No hydronephrosis. No renal or ureteric calculi. Urinary bladder is under distended, precluding optimal assessment. However, no large mass or stones identified. No perivesical fat stranding. Stomach/Bowel: Redemonstration of disproportionate dilation of stomach and proximal small bowel loops. The degree of dilation is more pronounced than the prior exam. There is pancreaticobiliary limb which anastomosis with the jejunum in the right paramedian middle abdomen. Just distal to the jejuno-jejunal anastomosis, there is abrupt narrowing with a transition point seen on series 2, images 46-47. Findings favor high-grade small-bowel obstruction secondary to adhesions. Rest of the bowel loops are collapsed.  Unremarkable appendix. Vascular/Lymphatic: No ascites or pneumoperitoneum. No abdominal or pelvic lymphadenopathy, by size criteria. No aneurysmal dilation of the major abdominal arteries. There are mild peripheral atherosclerotic vascular calcifications of the aorta and its major branches. Reproductive: Normal size prostate. Symmetric seminal vesicles. Other: There is a tiny fat containing umbilical hernia. The soft tissues and abdominal wall are otherwise unremarkable. Musculoskeletal: No suspicious osseous lesions. There are mild multilevel degenerative changes in  the visualized spine. IMPRESSION: *Redemonstration of high-grade small bowel obstruction with transition point just distal to the jejuno-jejunal anastomosis as described in detail above. *Multiple other nonacute observations, as described above. Electronically Signed   By: Jules Schick M.D.   On: 12/06/2023 08:53   DG ABD ACUTE 2+V W 1V CHEST Result Date: 12/05/2023 CLINICAL DATA:  Ileus following gastrointestinal surgery EXAM: DG ABDOMEN ACUTE WITH 1 VIEW CHEST COMPARISON:  12/04/2023 at 8:03 a.m. FINDINGS: Enteric tube looped in the gastric fundus with tip near the GE junction. Side port is within the stomach. Similar small bowel dilation in the central abdomen. Heart size and mediastinal contours are within normal limits. Both lungs are clear. IMPRESSION: 1. Enteric tube looped in the gastric fundus with tip near the GE junction. Side port is within the stomach. 2. Persistent small bowel dilation in the central abdomen. Electronically Signed   By: Minerva Fester M.D.   On: 12/05/2023 14:00   [place] EKG SITE RITE Result Date: 12/05/2023 If Site Rite image not attached, placement could not be confirmed due to current cardiac rhythm.  [place] EKG SITE RITE Result Date: 12/05/2023 If Site Rite image not attached, placement could not be confirmed due to current cardiac rhythm.  ECHO 10/24/2023: Moderate left ventricular hypertrophy.  LV ejection fraction = 60-65%.  LV global longitudinal strain performed and reviewed but not reported due to suboptimal myocardial  tracking.  Left ventricular filling pattern is pseudonormal.  The right  ventricular systolic function is normal.  The left atrium is moderately dilated.  The right atrium is mildly dilated.  There is focal thickening noted on the RCC on PSAX view  There is mild mitral regurgitation.  There is mild tricuspid regurgitation.   TELEMETRY reviewed by me 12/06/23: sinus rhythm rate 80s  EKG reviewed by me 12/06/23: AF RVR rate 145 bpm  DATA  reviewed by me 12/06/23: last 24h vitals tele labs imaging I/O, hospitalist progress note  Principal Problem:   SBO (small bowel obstruction) (HCC) Active Problems:   AKI (acute kidney injury) (HCC)   Alcohol abuse   Tobacco abuse   History of duodenal ulcer   GERD (gastroesophageal reflux disease)   COPD (chronic obstructive pulmonary disease) (HCC)   Hyponatremia   Choledocholithiasis   Elevated troponin   Asthma, chronic   Paroxysmal atrial fibrillation with RVR (HCC)    ASSESSMENT AND PLAN: Wesley Francis is a 57 y.o. male with a past medical history of paroxysmal atrial fibrillation, hypertension, alcohol use who presented to the ED on 11/30/2023 for emesis. Developed AF RVR over the weekend and cardiology was consulted.   # Atrial fibrillation RVR # Hypertension Patient admitted for SBO after recent cholecystectomy. Developed AF RVR on 12/29. Initially on diltiazem but this was discontinued when he converted back to NSR. Then converted back to AF RVR, started on amio gtt. Now back in NSR and maintaining.  -Continue amio gtt. Can transition to po once off NPO restrictions.  -Anticoagulation held at this time, recommend considering once stabilized and able to tolerate.   # Small bowel obstruction Patient with SBO on presentation.  -Management per general surgery.  -Patient will be transferred to [facility] for further management.  This patient's case was discussed and created with Dr. Juliann Pares and he is in agreement.  Signed:  Gale Journey, PA-C  12/06/2023, 12:07 PM [facility] Cardiology

## 2023-12-06 NOTE — Progress Notes (Signed)
[facility]  ROUNDING NOTE   Subjective:   Patient seen resting in bed, family at bedside NG tube remains in place TPN infusing Patient states he feels well Reports they are awaiting transfer to [facility]  Creatinine 1.46   Objective:  Vital signs in last 24 hours:  Temp:  [97.6 F (36.4 C)-98.4 F (36.9 C)] 97.6 F (36.4 C) (12/30 1130) Pulse Rate:  [59-81] 59 (12/30 0500) Resp:  [15-23] 17 (12/30 1130) BP: (110-133)/(73-90) 110/90 (12/30 1130) SpO2:  [94 %-100 %] 100 % (12/30 1130)  Weight change:  Filed Weights   12/01/23 1502 12/04/23 0400  Weight: 70.3 kg 60.4 kg    Intake/Output: I/O last 3 completed shifts: In: 3727.3 [I.V.:3349.8; NG/GT:30; IV Piggyback:347.5] Out: 3275 [Urine:375; Emesis/NG output:2900]   Intake/Output this shift:  Total I/O In: 482.5 [I.V.:122.5; NG/GT:360] Out: 1050 [Emesis/NG output:1050]  Physical Exam: General: NAD  Head: Normocephalic, atraumatic.  NGT  Eyes: Anicteric  Lungs:  Clear to auscultation, normal effort, room air  Heart: Regular rate and rhythm  Abdomen:  Soft, nontender, nondistended, hypoactive bowel sounds  Extremities: No peripheral edema.  Neurologic: Nonfocal, moving all four extremities  Skin: No lesions       Basic Metabolic Panel: Recent Labs  Lab 12/02/23 0630 12/03/23 0450 12/04/23 0502 12/05/23 0747 12/06/23 0527  NA 133* 141 144 145 143  K 3.2* 3.4* 3.8 3.8 3.6  CL 94* 98 107 110 104  CO2 26 25 22 24 27   GLUCOSE 95 89 91 125* 94  BUN 62* 56* 49* 39* 34*  CREATININE 2.94* 2.01* 1.72* 1.46* 1.46*  CALCIUM 8.7* 9.6 9.2 9.5 10.1  MG  --  2.6*  --  2.4 2.3  PHOS 3.8  --   --  2.1* 4.2    Liver Function Tests: Recent Labs  Lab 11/30/23 1550 12/01/23 0504 12/06/23 0527  AST 44* 36 51*  ALT 20 23 41  ALKPHOS 48 35* 54  BILITOT 1.1 1.3* 0.9  PROT 10.1* 7.6 8.8*  ALBUMIN 5.7* 4.1 4.8   Recent Labs  Lab 11/30/23 1550  LIPASE 30   No results for input(s):  "AMMONIA" in the last 168 hours.  CBC: Recent Labs  Lab 11/30/23 1550 12/01/23 0504 12/02/23 0630 12/04/23 0502  WBC 15.6* 17.1* 13.7* 13.1*  NEUTROABS 13.4*  --   --   --   HGB 16.2 14.2 12.9* 13.6  HCT 50.3 42.5 39.0 42.3  MCV 75.3* 74.3* 73.6* 75.5*  PLT 319 269 277 321    Cardiac Enzymes: Recent Labs  Lab 11/30/23 1550  CKTOTAL 305    BNP: Invalid input(s): "POCBNP"  CBG: Recent Labs  Lab 11/30/23 1551 12/05/23 0721 12/05/23 2319 12/06/23 0526 12/06/23 1131  GLUCAP 155* 135* 122* 104* 120*    Microbiology: Results for orders placed or performed during the hospital encounter of 11/30/23  Resp panel by RT-PCR (RSV, Flu A&B, Covid) Anterior Nasal Swab     Status: None   Collection Time: 11/30/23  3:57 PM   Specimen: Anterior Nasal Swab  Result Value Ref Range Status   SARS Coronavirus 2 by RT PCR NEGATIVE NEGATIVE Final    Comment: (NOTE) SARS-CoV-2 target nucleic acids are NOT DETECTED.  The SARS-CoV-2 RNA is generally detectable in upper respiratory specimens during the acute phase of infection. The lowest concentration of SARS-CoV-2 viral copies this assay can detect is 138 copies/mL. A negative result does not preclude SARS-Cov-2 infection and should not be used as the sole  basis for treatment or other patient management decisions. A negative result may occur with  improper specimen collection/handling, submission of specimen other than nasopharyngeal swab, presence of viral mutation(s) within the areas targeted by this assay, and inadequate number of viral copies(<138 copies/mL). A negative result must be combined with clinical observations, patient history, and epidemiological information. The expected result is Negative.  Fact Sheet for Patients:  BloggerCourse.com  Fact Sheet for Healthcare Providers:  SeriousBroker.it  This test is no t yet approved or cleared by the [place] FDA and   has been authorized for detection and/or diagnosis of SARS-CoV-2 by FDA under an Emergency Use Authorization (EUA). This EUA will remain  in effect (meaning this test can be used) for the duration of the COVID-19 declaration under Section 564(b)(1) of the Act, 21 U.S.C.section 360bbb-3(b)(1), unless the authorization is terminated  or revoked sooner.       Influenza A by PCR NEGATIVE NEGATIVE Final   Influenza B by PCR NEGATIVE NEGATIVE Final    Comment: (NOTE) The Xpert Xpress SARS-CoV-2/FLU/RSV plus assay is intended as an aid in the diagnosis of influenza from Nasopharyngeal swab specimens and should not be used as a sole basis for treatment. Nasal washings and aspirates are unacceptable for Xpert Xpress SARS-CoV-2/FLU/RSV testing.  Fact Sheet for Patients: BloggerCourse.com  Fact Sheet for Healthcare Providers: SeriousBroker.it  This test is not yet approved or cleared by the [place] FDA and has been authorized for detection and/or diagnosis of SARS-CoV-2 by FDA under an Emergency Use Authorization (EUA). This EUA will remain in effect (meaning this test can be used) for the duration of the COVID-19 declaration under Section 564(b)(1) of the Act, 21 U.S.C. section 360bbb-3(b)(1), unless the authorization is terminated or revoked.     Resp Syncytial Virus by PCR NEGATIVE NEGATIVE Final    Comment: (NOTE) Fact Sheet for Patients: BloggerCourse.com  Fact Sheet for Healthcare Providers: SeriousBroker.it  This test is not yet approved or cleared by the [place] FDA and has been authorized for detection and/or diagnosis of SARS-CoV-2 by FDA under an Emergency Use Authorization (EUA). This EUA will remain in effect (meaning this test can be used) for the duration of the COVID-19 declaration under Section 564(b)(1) of the Act, 21 U.S.C. section 360bbb-3(b)(1),  unless the authorization is terminated or revoked.  Performed at [facility], [address] [place], [place] [address]     Coagulation Studies: No results for input(s): "LABPROT", "INR" in the last 72 hours.  Urinalysis: No results for input(s): "COLORURINE", "LABSPEC", "PHURINE", "GLUCOSEU", "HGBUR", "BILIRUBINUR", "KETONESUR", "PROTEINUR", "UROBILINOGEN", "NITRITE", "LEUKOCYTESUR" in the last 72 hours.  Invalid input(s): "APPERANCEUR"    Imaging: CT ABDOMEN PELVIS W CONTRAST Result Date: 12/06/2023 CLINICAL DATA:  Bowel obstruction suspected. EXAM: CT ABDOMEN AND PELVIS WITH CONTRAST TECHNIQUE: Multidetector CT imaging of the abdomen and pelvis was performed using the standard protocol following bolus administration of intravenous contrast. RADIATION DOSE REDUCTION: This exam was performed according to the departmental dose-optimization program which includes automated exposure control, adjustment of the mA and/or kV according to patient size and/or use of iterative reconstruction technique. CONTRAST:  OMNIPAQUE IOHEXOL 300 MG/ML  SOLN COMPARISON:  CT scan abdomen and pelvis from 11/30/2023. FINDINGS: Lower chest: There are patchy atelectatic changes in the visualized lung bases. No overt consolidation. No pleural effusion. The heart is normal in size. No pericardial effusion. Hepatobiliary: The liver is normal in size. Non-cirrhotic configuration. No suspicious mass. There is pneumobilia in the left hepatic lobe.  Left branch of the portal vein is not visualized. No intrahepatic or extrahepatic bile duct dilation. Gallbladder is surgically absent. Pancreas: Unremarkable. No pancreatic ductal dilatation or surrounding inflammatory changes. Redemonstration of embolization coils in the pancreatic head region. Spleen: Spleen is enlarged measuring upto cm orthogonally on coronal plane. No focal lesion. Adrenals/Urinary Tract: Adrenal glands are unremarkable. No suspicious renal  mass. Redemonstration of multiple simple cysts in bilateral kidneys with largest arising from the right kidney upper pole measuring 1.7 x 1.8 cm. No hydronephrosis. No renal or ureteric calculi. Urinary bladder is under distended, precluding optimal assessment. However, no large mass or stones identified. No perivesical fat stranding. Stomach/Bowel: Redemonstration of disproportionate dilation of stomach and proximal small bowel loops. The degree of dilation is more pronounced than the prior exam. There is pancreaticobiliary limb which anastomosis with the jejunum in the right paramedian middle abdomen. Just distal to the jejuno-jejunal anastomosis, there is abrupt narrowing with a transition point seen on series 2, images 46-47. Findings favor high-grade small-bowel obstruction secondary to adhesions. Rest of the bowel loops are collapsed.  Unremarkable appendix. Vascular/Lymphatic: No ascites or pneumoperitoneum. No abdominal or pelvic lymphadenopathy, by size criteria. No aneurysmal dilation of the major abdominal arteries. There are mild peripheral atherosclerotic vascular calcifications of the aorta and its major branches. Reproductive: Normal size prostate. Symmetric seminal vesicles. Other: There is a tiny fat containing umbilical hernia. The soft tissues and abdominal wall are otherwise unremarkable. Musculoskeletal: No suspicious osseous lesions. There are mild multilevel degenerative changes in the visualized spine. IMPRESSION: *Redemonstration of high-grade small bowel obstruction with transition point just distal to the jejuno-jejunal anastomosis as described in detail above. *Multiple other nonacute observations, as described above. Electronically Signed   By: Jules Schick M.D.   On: 12/06/2023 08:53   DG ABD ACUTE 2+V W 1V CHEST Result Date: 12/05/2023 CLINICAL DATA:  Ileus following gastrointestinal surgery EXAM: DG ABDOMEN ACUTE WITH 1 VIEW CHEST COMPARISON:  12/04/2023 at 8:03 a.m. FINDINGS:  Enteric tube looped in the gastric fundus with tip near the GE junction. Side port is within the stomach. Similar small bowel dilation in the central abdomen. Heart size and mediastinal contours are within normal limits. Both lungs are clear. IMPRESSION: 1. Enteric tube looped in the gastric fundus with tip near the GE junction. Side port is within the stomach. 2. Persistent small bowel dilation in the central abdomen. Electronically Signed   By: Minerva Fester M.D.   On: 12/05/2023 14:00   [place] EKG SITE RITE Result Date: 12/05/2023 If Site Rite image not attached, placement could not be confirmed due to current cardiac rhythm.  [place] EKG SITE RITE Result Date: 12/05/2023 If Site Rite image not attached, placement could not be confirmed due to current cardiac rhythm.    Medications:    .TPN (CLINIMIX-E) Adult 42 mL/hr at 12/06/23 1000   amiodarone 30 mg/hr (12/06/23 1255)   TPN (CLINIMIX) Adult with Electrolyte Additives     And   fat emul(SMOFlipid)      Chlorhexidine Gluconate Cloth  6 each Topical Daily   enoxaparin (LOVENOX) injection  40 mg Subcutaneous Q24H   insulin aspart  0-9 Units Subcutaneous Q6H   pantoprazole (PROTONIX) IV  40 mg Intravenous Q12H   sodium chloride flush  10-40 mL Intracatheter Q12H   thiamine (VITAMIN B1) injection  100 mg Intravenous Daily   ondansetron **OR** ondansetron (ZOFRAN) IV, prochlorperazine, sodium chloride flush  Assessment/ Plan:  [name] is a 57 y.o.  male  with a PMHx of alcohol abuse, hypertension, history of complicated choledocholithiasis including choledocho duodenal fistula, Roux-en-Y hepaticojejunostomy, cholecystectomy 10/20/2023, prior duodenal ulcer requiring coiling, who was admitted to [facility] on 11/30/2023 for SBO (small bowel obstruction) (HCC) [K56.609] AKI (acute kidney injury) (HCC) [N17.9]   Acute kidney injury likely secondary to dehydration from small bowel obstruction. Baseline creatinine noted to be 1.17.  Presenting creatinine 4.37. Creatinine continues to move slowly toward stated baseline.  Continues to have large output from NG tube, 2.25 L in preceding 24 hours.  Remains n.p.o. with TPN infusing.  Lab Results  Component Value Date   CREATININE 1.46 (H) 12/06/2023   CREATININE 1.46 (H) 12/05/2023   CREATININE 1.72 (H) 12/04/2023    Intake/Output Summary (Last 24 hours) at 12/06/2023 1300 Last data filed at 12/06/2023 1000 Gross per 24 hour  Intake 2940.63 ml  Output 3225 ml  Net -284.37 ml   2.  Mild hyponatremia.  Serum sodium 143.  Likely related to bowel obstruction.  Corrected   3.  Small bowel obstruction.  Currently has NG tube in place.  Management as per hospitalist and surgery.  Seeking transfer to [facility] for further management.      LOS: 6 Shatonia Hoots 12/30/20241:00 PM

## 2023-12-06 NOTE — Progress Notes (Signed)
Report called by RN to receiving facility.  [facility] RN, Shanda Bumps, received report.  Questions answered.  [facility] to transport patient within the hour.

## 2023-12-06 NOTE — TOC Progression Note (Signed)
Transition of Care [facility]) - Progression Note    Patient Details  Name: Wesley Francis MRN: [id number] Date of Birth: 10-30-1966  Transition of Care [facility]) CM/SW Contact  Truddie Hidden, RN Phone Number: 12/06/2023, 11:29 AM  Clinical Narrative:    TOC continuing to follow patient's progress throughout discharge planning.        Expected Discharge Plan and Services                                               Social Determinants of Health (SDOH) Interventions SDOH Screenings   Food Insecurity: No Food Insecurity (12/01/2023)  Housing: Low Risk  (12/01/2023)  Transportation Needs: No Transportation Needs (12/01/2023)  Utilities: Not At Risk (12/01/2023)  Tobacco Use: High Risk (12/01/2023)    Readmission Risk Interventions     No data to display

## 2023-12-06 NOTE — Progress Notes (Addendum)
PHARMACY - TOTAL PARENTERAL NUTRITION CONSULT NOTE   Indication: Small bowel obstruction  Patient Measurements: Height: 5\' 6"  (167.6 cm) Weight: 60.4 kg (133 lb 2.5 oz) IBW/kg (Calculated) : 63.8 TPN AdjBW (KG): 70.3 Body mass index is 21.49 kg/m. Usual Weight:    Assessment: 57 yo M w/ SBO 6 weeks out from a major hepaticojejunostomy with a Roux-en-Y reconstruction. 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 PMHx of alcohol abuse, hypertension   Glucose / Insulin: No noted hx of diabetes sSSI q6h Electrolytes: WNL.  Renal: Scr 1.46 Hepatic: WNL.  Intake / Output; MIVF: D5LR@ 100 ml/hr GI Imaging:KUB pers reviewed showing persistent SB dilation, He did have gastrografin challenge showing contract in he colon  GI Surgeries / Procedures:  CT abdomen: Redemonstration of high-grade small bowel obstruction with transition point just distal to the jejuno-jejunal anastomosis  11/13: Cholecystectomy.   Central access: PICC 12/29 TPN start date:  12/29   Nutritional Goals: Goal TPN rate is 42 mL/hr (provides 80 g of protein and 664 kcals per day)  RD Assessment:  PENDING Estimated Needs Total Energy Estimated Needs: 1800-2000 Total Protein Estimated Needs: 105-120 grams Total Fluid Estimated Needs: 1.8-2.0 L  Current Nutrition:  NPO  Plan:  - Continue Clinimix 8/10 at 42 mL/hr at 1800 + SMOFlipid 20% 20 ml/hr over 12 hours. This will give the total of 1,145.28 kcal and 80 g of protein. Recommend give 2000 ml bag x 2 and 1000 ml bags x 5 per week for a estimated weekly average of 1353 Kcal/day and 103 g of protein per day.  - Electrolyte added to TPN: NaAcet 30 mEq, NaPhos 40 mEq, Mg 10 mEq, CaGluc 9 mEq, KCl 20 mEq, K Acet 40 mEq - thiamine 100 mg IV x 7 days.  Add standard MVI and trace elements to TPN Initiate Sensitive q6h SSI and adjust as needed  Monitor TPN labs on Mon/Thurs, Daily for first 3 days or per assessment  Wesley Francis, PharmD,  BCPS Clinical Pharmacist 12/06/2023

## 2023-12-06 NOTE — Progress Notes (Addendum)
[facility] SURGICAL PROGRESS NOTE (cpt [phone])  Hospital Day(s): 6.   Interval History: Patient seen and examined, no acute events or new complaints overnight. Patient reports he continues to have abdominal distension without improvement. No fever, chills. Renal function has improved; sCr - 1.46; UO - 375 ccs + unmeasured. BUN 34. No electrolyte derangements. NGT in place; output 2250 ccs in last 24 hours. He did have repeat CT Abdomen/Pelvis this morning which is concerning of high grade and persistent obstruction and jejuno-jejunal anastomosis. He is not passing flatus.   Review of Systems:  Constitutional: denies fever, chills  HEENT: denies cough or congestion  Respiratory: denies any shortness of breath  Cardiovascular: denies chest pain or palpitations  Gastrointestinal: + abdominal pain; + distension, denied n/v Genitourinary: denies burning with urination or urinary frequency Musculoskeletal: denies pain, decreased motor or sensation  Vital signs in last 24 hours: [min-max] current  Temp:  [97.9 F (36.6 C)-98.4 F (36.9 C)] 98 F (36.7 C) (12/30 0500) Pulse Rate:  [59-138] 59 (12/30 0500) Resp:  [13-23] 15 (12/29 2300) BP: (85-155)/(70-121) 128/79 (12/29 2300) SpO2:  [92 %-100 %] 97 % (12/29 2300)     Height: 5\' 6"  (167.6 cm) Weight: 60.4 kg BMI (Calculated): 21.5   Intake/Output last 2 shifts:  12/29 0701 - 12/30 0700 In: 3472 [I.V.:3124.5; IV Piggyback:347.5] Out: 2625 [Urine:375; Emesis/NG output:2250]   Physical Exam:  Constitutional: alert, cooperative and no distress  HENT: normocephalic without obvious abnormality; NGT in place; output remains high  Eyes: PERRL, EOM's grossly intact and symmetric  Respiratory: breathing non-labored at rest  Cardiovascular: regular rate and sinus rhythm  Gastrointestinal: Soft, distended and somewhat worse than previous examinations, no significant tenderness, no rebound/guarding  Musculoskeletal: no edema or  wounds, motor and sensation grossly intact, NT    Labs:     Latest Ref Rng & Units 12/04/2023    5:02 AM 12/02/2023    6:30 AM 12/01/2023    5:04 AM  CBC  WBC 4.0 - 10.5 K/uL 13.1  13.7  17.1   Hemoglobin 13.0 - 17.0 g/dL 19.1  47.8  29.5   Hematocrit 39.0 - 52.0 % 42.3  39.0  42.5   Platelets 150 - 400 K/uL 321  277  269       Latest Ref Rng & Units 12/06/2023    5:27 AM 12/05/2023    7:47 AM 12/04/2023    5:02 AM  CMP  Glucose 70 - 99 mg/dL 94  621  91   BUN 6 - 20 mg/dL 34  39  49   Creatinine 0.61 - 1.24 mg/dL 3.08  6.57  8.46   Sodium 135 - 145 mmol/L 143  145  144   Potassium 3.5 - 5.1 mmol/L 3.6  3.8  3.8   Chloride 98 - 111 mmol/L 104  110  107   CO2 22 - 32 mmol/L 27  24  22    Calcium 8.9 - 10.3 mg/dL 96.2  9.5  9.2   Total Protein 6.5 - 8.1 g/dL 8.8     Total Bilirubin <1.2 mg/dL 0.9     Alkaline Phos 38 - 126 U/L 54     AST 15 - 41 U/L 51     ALT 0 - 44 U/L 41        Imaging studies:   CT Abdomen/Pelvis (12/06/2023) personally reviewed with continued evidence of small bowel obstruction with transition at level of jejuno-jejunal anastomosis, PO contrast does not traverse this point,  no free air, and radiologist report reviewed below: IMPRESSION: *Redemonstration of high-grade small bowel obstruction with transition point just distal to the jejuno-jejunal anastomosis as described in detail above. *Multiple other nonacute observations, as described above.   Assessment/Plan: (ICD-10's: K84.609) 57 y.o. male with persistent small bowel obstruction s/p robotic assisted laparoscopic cholecystectomy, CDB exploration, and hepaticojejunostomy and Rouz-en-y on 10/20/2023 at [facility] for complicated history of migration of GDA coils with erosion into the CBD   - Given his persistent evidence of SBO, with transition near jejuno-jejunal anastomosis, in setting of recent complex hepatobiliary surgery, we do feel he warrants Tx to tertiary center for  consideration of surgical intervention. We do not have hepatobiliary surgery at this facility. I have reached out to [facility] regarding transfer and am awaiting call back    - For now, recommend continue NPO.  - Continue TPN - Continue NGT decompression; LIS; monitor and record output  - Monitor abdominal examination; on-going bowel function  - Pain control prn; antiemetics prn   - Mobilize as tolerated  - Appreciate cardiology and nephrology input  - Further management per primary; we will follow while in house   All of the above findings and recommendations were discussed with the patient, and the medical team, and all of patient's questions were answered to his expressed satisfaction.  Face-to-face time spent with the patient and care providers was >60 minutes, with more than 50% of the time spent counseling, educating, and coordinating care of the patient.    -- Lynden Oxford, PA-C [facility] 12/06/2023, 7:31 AM M-F: 7am - 4pm

## 2023-12-06 NOTE — Progress Notes (Signed)
Brief Progress Note Spoke with Dr Archer Asa, MD at [facility] who is willing to accept patient in Transfer to SICU  Updated Hospitalist MD  -- Lynden Oxford, PA-C [facility] 12/06/2023, 11:26 AM M-F: 7am - 4pm

## 2023-12-06 NOTE — Progress Notes (Signed)
Initial Nutrition Assessment  DOCUMENTATION CODES:   Severe malnutrition in context of chronic illness  INTERVENTION:   -TPN management per pharmacy -100 gm thiamine daily x 10 days secondary to high refeeding risk -MVI daily (in TPN) -Monitor Mg, K, and Phos and replete as necessary secondary to high refeeding risk -RD will follow for diet advancement and add supplements as appropriate  NUTRITION DIAGNOSIS:   Severe Malnutrition related to chronic illness (ETOH abuse) as evidenced by mild fat depletion, moderate fat depletion, moderate muscle depletion, severe muscle depletion.  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  Pt admitted with SBO.   12/24- NGT placed 12/26- NGT replaced (placement in stomach per KUB on 12/02/23) 12/29- TPN initiated, PICC placed  Reviewed I/O's: +1 L x 24 hours and +188 ml since admission  UOP: 375 ml x 24 hours  NGT output: 2.3 L x 24 hours   CT of abdomen and pelvis today reveals SBO with transition at level of jejuni-jejunal anastomosis.   Per general surgery, plan to transfer to [facility] secondary to recent complex hepatobiliary surgery.   Spoke with pt and sister at bedside. Pt pleasant and in good spirits and offered no complaints other than being unable to eat. Sister reports pt underwent robotic bile duct exploration and roux en y  hepaticojejunostomy and cholecystectomy on 10/20/23 at [facility]. Pt has been recovering well at home after hospitalization and had recent follow-up on 11/23/23 where no concerns were noted, however, sister reported no CT or imaging were done at follow-up. Pt reports he was up walking at home PTA with no signs of weakness or falls. Per pt, he was eating well PTA, consuming 2-3 meals per day prepared by his sisters. Per pt, his last oral intake was 12/01/23, when he experienced vomiting after eating a piece of [employer] chicken (per pt he usually does not eat fried foods or eat out). Pt complains of hiccups since admission; noted pt hiccuping during visit when eating ice chips.   Reviewed wt hx; pt has experienced a 17.3% wt loss over the past 6 months, which is significant for time frame. Pt and sister report his UBW is around 154#, and does not think he has lost weight recently. Pt reports he may have lost weight while hospitalized secondary to lack of nutrition. Pt also with edema which may be masking true weight loss as well as fat and muscle depletions.    Discussed rationale for NGT and NPO status. Informed pt and sister how pt will receive nutrition (TPN); sister asked multiple questions, including how long pt would be on TPN; RD discussed rationale for NPO status and parameters for which NGT could be removed as well as diet progression. Discussed that TPN would be discontinued once pt is able to consume adequate nutrition on solid foods, however, length of therapy would be dependent on pt progress.   Case discussed with pharmacy; TPN stated yesterday. Discussed need for MVI, thiamine, and electrolyte monitoring  secondary to malnutrition and refeeding risk.   Medications reviewed and include lovenox and thiamine.   Lab Results  Component Value Date   HGBA1C 4.9 12/20/2017   PTA DM medications are none.   Labs reviewed: CBGS: 104-122 (inpatient orders for glycemic control are 0-9 units insulin aspart  every 6 hours).    NUTRITION - FOCUSED PHYSICAL EXAM:  Flowsheet Row Most Recent Value  Orbital Region Mild depletion  Upper Arm Region Moderate depletion  Thoracic and Lumbar Region Mild depletion  Buccal Region Mild depletion  Temple Region Moderate depletion  Clavicle Bone Region Severe depletion  Clavicle and Acromion Bone Region Severe depletion  Scapular Bone Region Severe depletion  Dorsal Hand Moderate depletion  Patellar Region Moderate depletion  Anterior Thigh Region Moderate depletion  Posterior Calf Region Moderate depletion  Edema (RD Assessment) Mild  Hair Reviewed  Eyes Reviewed  Mouth Reviewed  Skin Reviewed  Nails Reviewed       Diet Order:   Diet Order             Diet NPO time specified  Diet effective now                   EDUCATION NEEDS:   Education needs have been addressed  Skin:  Skin Assessment: Reviewed RN Assessment  Last BM:  12/01/23  Height:   Ht Readings from Last 1 Encounters:  12/01/23 5\' 6"  (1.676 m)    Weight:   Wt Readings from Last 1 Encounters:  12/04/23 60.4 kg    Ideal Body Weight:  64.5 kg  BMI:  Body mass index is 21.49 kg/m.  Estimated Nutritional Needs:   Kcal:  1800-2000  Protein:  105-120 grams  Fluid:  1.8-2.0 L    Levada Schilling, RD, LDN, CDCES Registered Dietitian III Certified Diabetes Care and Education Specialist If unable to reach this RD, please use "RD Inpatient" group chat on secure chat between hours of 8am-4 pm daily

## 2023-12-06 NOTE — Discharge Summary (Signed)
Physician Discharge Summary   Patient: Wesley Francis MRN: [id number] DOB: September 22, 1966  Admit date:     11/30/2023  Discharge date: 12/06/23  Discharge Physician: Marrion Coy   PCP: Fanny Dance, FNP   Recommendations at discharge:   Transferred to [facility] for surgery.  Discharge Diagnoses: Principal Problem:   SBO (small bowel obstruction) (HCC) Active Problems:   Alcohol abuse   Tobacco abuse   AKI (acute kidney injury) (HCC)   History of duodenal ulcer   GERD (gastroesophageal reflux disease)   COPD (chronic obstructive pulmonary disease) (HCC)   Hyponatremia   Choledocholithiasis   Elevated troponin   Asthma, chronic   Paroxysmal atrial fibrillation with RVR (HCC)  Resolved Problems:   * No resolved hospital problems. *  Hospital Course: Wesley Francis is a 57 y.o. 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  12/29.  Patient developed atrial fibrillation with RVR.  Started on diltiazem drip, given digoxin and metoprolol.  PICC line placed for TPN. 12/30.  Heart rate better controlled on amnio drip.  Repeated KUB did not show any improvement of SBO, transfer to [facility].  Assessment and Plan: Paroxysmal atrial fibrillation with RVR. Patient developed atrial fibrillation with RVR in the morning of 12/29.  Did not have any chest pain or shortness of breath.  Patient was given IV metoprolol, digoxin, and diltiazem drip.  Reviewed recent echocardiogram performed on 03/2022, EF was 65 to 70% with no significant valvular disease. Per patient sister, patient  has a still listed 2 episodes of atrial fibrillation in the past. Cardiology consult is obtained.  But will hold off anticoagulation for now until decision is made about small bowel obstruction. Patient has converted to sinus.  Continue amiodarone.    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 Repeated KUB did not show any improvement, patient is followed by general surgery, small bowel follow-through showed a Gastrografin has now passed into colon.  However, KUB did not show any improvement.  Patient has not had any bowel movement.  General surgery has decided that patient needed surgery.  As result, patient be transferred to [facility] for surgery. PICC line was placed on 12/29, TPN started.   Lactic acidosis. Initial lactic acid level was 3.1, followed by 9.0, then dropped back to 2.4.  Lactic acid was not consistent with clinical picture, most likely due to lab error. Patient currently does not have any abdominal rebound tenderness, abdominal pain seems to be better.  Not consistent with bowel ischemia.   Acute kidney injury. Hyponatremia. Hypokalemia. Hypophosphatemia. Patient prior renal function was normal, creatinine went up to 5.01 today.  Renal ultrasound did not show any obstruction, but did show evidence of chronic kidney disease. 12/29. Creatinine dropped down to 1.46, phosphorus level 2.1, will give a dose of potassium phosphate.   Alcohol abuse  Tobacco abuse No evidence of alcohol withdrawal.   Asthma, chronic Stable.    Elevated troponin Appear to be due to acute renal failure.    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 system    COPD (chronic obstructive pulmonary disease) (HCC) Stable.   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         Consultants: General surgery, cardiology. Procedures performed: None  Disposition:  [facility]. Diet recommendation:  Discharge Diet Orders (From admission, onward)     Start     Ordered   12/06/23 0000  Diet general       Comments: NPO, TPN only   12/06/23 1148           NPO   DISCHARGE MEDICATION: Allergies as of 12/06/2023   No Known Allergies      Medication List     STOP taking these medications    albuterol (2.5 MG/3ML) 0.083% nebulizer solution Commonly known as: PROVENTIL   amLODipine 5 MG tablet Commonly known as: NORVASC   folic acid 1 MG tablet Commonly known as: FOLVITE   pantoprazole 40 MG tablet Commonly known as: PROTONIX Replaced by: pantoprazole 40 MG injection       TAKE these medications    amiodarone 360-4.14 MG/200ML-% Soln Commonly known as: NEXTERONE PREMIX Inject 30 mg/hr into the vein continuous.   Chlorhexidine Gluconate Cloth 2 % Pads Apply 6 each topically daily. Start taking on: December 07, 2023   enoxaparin 40 MG/0.4ML injection Commonly known as: LOVENOX Inject 0.4 mLs (40 mg total) into the skin daily.   fat emul(SMOFlipid) 20 % Emul infusion Inject 250 mLs into the vein continuous.   insulin aspart 100 UNIT/ML injection Commonly known as: novoLOG Inject 0-9 Units into the skin  every 6 (six) hours.   ondansetron 4 MG/2ML Soln injection Commonly known as: ZOFRAN Inject 2 mLs (4 mg total) into the vein every 6 (six) hours as needed for nausea.   pantoprazole 40 MG injection Commonly known as: PROTONIX Inject 40 mg into the vein every 12 (twelve) hours. Replaces: pantoprazole 40 MG tablet   prochlorperazine 10 MG/2ML injection Commonly known as: COMPAZINE Inject 2 mLs (10 mg total) into the vein every 8 (eight) hours as needed (Hiccups).   Vitamin D3 50 MCG (2000 UT) capsule Take by mouth.   Cholecalciferol 1.25 MG (50000 UT) Tabs Take 1 capsule by mouth every other day.        Discharge Exam: Filed Weights   12/01/23 1502 12/04/23 0400  Weight: 70.3 kg 60.4 kg   General exam: Appears calm and comfortable  Respiratory system: Clear to auscultation. Respiratory effort normal. Cardiovascular system: Regular. No JVD, murmurs, rubs, gallops or clicks. No pedal edema. Gastrointestinal system: Abdomen is nondistended, soft and nontender. No organomegaly or masses felt. Normal bowel sounds heard. Central nervous system: Alert and oriented. No focal neurological deficits. Extremities: Symmetric 5 x 5 power. Skin: No rashes, lesions or ulcers Psychiatry: Judgement and insight appear normal. Mood & affect appropriate.    Condition at discharge: fair  The results of significant diagnostics from this hospitalization (including imaging,  microbiology, ancillary and laboratory) are listed below for reference.   Imaging Studies: CT ABDOMEN PELVIS W CONTRAST Result Date: 12/06/2023 CLINICAL DATA:  Bowel obstruction suspected. EXAM: CT ABDOMEN AND PELVIS WITH CONTRAST TECHNIQUE: Multidetector CT imaging of the abdomen and pelvis was performed using the standard protocol following bolus administration of intravenous contrast. RADIATION DOSE REDUCTION: This exam was performed according to the departmental dose-optimization program which includes automated exposure  control, adjustment of the mA and/or kV according to patient size and/or use of iterative reconstruction technique. CONTRAST:  OMNIPAQUE IOHEXOL 300 MG/ML  SOLN COMPARISON:  CT scan abdomen and pelvis from 11/30/2023. FINDINGS: Lower chest: There are patchy atelectatic changes in the visualized lung bases. No overt consolidation. No pleural effusion. The heart is normal in size. No pericardial effusion. Hepatobiliary: The liver is normal in size. Non-cirrhotic configuration. No suspicious mass. There is pneumobilia in the left hepatic lobe. Left branch of the portal vein is not visualized. No intrahepatic or extrahepatic bile duct dilation. Gallbladder is surgically absent. Pancreas: Unremarkable. No pancreatic ductal dilatation or surrounding inflammatory changes. Redemonstration of embolization coils in the pancreatic head region. Spleen: Spleen is enlarged measuring upto cm orthogonally on coronal plane. No focal lesion. Adrenals/Urinary Tract: Adrenal glands are unremarkable. No suspicious renal mass. Redemonstration of multiple simple cysts in bilateral kidneys with largest arising from the right kidney upper pole measuring 1.7 x 1.8 cm. No hydronephrosis. No renal or ureteric calculi. Urinary bladder is under distended, precluding optimal assessment. However, no large mass or stones identified. No perivesical fat stranding. Stomach/Bowel: Redemonstration of disproportionate dilation of stomach and proximal small bowel loops. The degree of dilation is more pronounced than the prior exam. There is pancreaticobiliary limb which anastomosis with the jejunum in the right paramedian middle abdomen. Just distal to the jejuno-jejunal anastomosis, there is abrupt narrowing with a transition point seen on series 2, images 46-47. Findings favor high-grade small-bowel obstruction secondary to adhesions. Rest of the bowel loops are collapsed.  Unremarkable appendix. Vascular/Lymphatic: No ascites or pneumoperitoneum.  No abdominal or pelvic lymphadenopathy, by size criteria. No aneurysmal dilation of the major abdominal arteries. There are mild peripheral atherosclerotic vascular calcifications of the aorta and its major branches. Reproductive: Normal size prostate. Symmetric seminal vesicles. Other: There is a tiny fat containing umbilical hernia. The soft tissues and abdominal wall are otherwise unremarkable. Musculoskeletal: No suspicious osseous lesions. There are mild multilevel degenerative changes in the visualized spine. IMPRESSION: *Redemonstration of high-grade small bowel obstruction with transition point just distal to the jejuno-jejunal anastomosis as described in detail above. *Multiple other nonacute observations, as described above. Electronically Signed   By: Jules Schick M.D.   On: 12/06/2023 08:53   DG ABD ACUTE 2+V W 1V CHEST Result Date: 12/05/2023 CLINICAL DATA:  Ileus following gastrointestinal surgery EXAM: DG ABDOMEN ACUTE WITH 1 VIEW CHEST COMPARISON:  12/04/2023 at 8:03 a.m. FINDINGS: Enteric tube looped in the gastric fundus with tip near the GE junction. Side port is within the stomach. Similar small bowel dilation in the central abdomen. Heart size and mediastinal contours are within normal limits. Both lungs are clear. IMPRESSION: 1. Enteric tube looped in the gastric fundus with tip near the GE junction. Side port is within the stomach. 2. Persistent small bowel dilation in the central abdomen. Electronically Signed   By: Minerva Fester M.D.   On: 12/05/2023 14:00   [place] EKG SITE RITE Result Date: 12/05/2023 If Site Rite image not attached, placement could not be confirmed due  to current cardiac rhythm.  [place] EKG SITE RITE Result Date: 12/05/2023 If Site Rite image not attached, placement could not be confirmed due to current cardiac rhythm.  DG Abd 1 View Result Date: 12/04/2023 CLINICAL DATA:  Follow up SBO EXAM: ABDOMEN - 1 VIEW COMPARISON:  12/02/2023. FINDINGS: Small bowel  dilatation present previously has somewhat improved. No radiopaque stones. Air overlies the descending colon and rectosigmoid. NG tube is partially imaged. IMPRESSION: Improving small bowel dilatation. Electronically Signed   By: Layla Maw M.D.   On: 12/04/2023 10:46   DG Abd Portable 1V-Small Bowel Obstruction Protocol-initial, 8 hr delay Result Date: 12/02/2023 CLINICAL DATA:  8 hour follow-up film EXAM: PORTABLE ABDOMEN - 1 VIEW COMPARISON:  Film from earlier in the same day. FINDINGS: Previously administered contrast now lies within the distal small bowel. Persistent small bowel dilatation is noted. Some early colonic contrast is noted as well. IMPRESSION: Persistent small bowel dilatation with passage of contrast material into the distal small bowel and proximal colon consistent with a partial small bowel obstruction. Electronically Signed   By: Alcide Clever M.D.   On: 12/02/2023 20:22   DG Abd 1 View Result Date: 12/02/2023 CLINICAL DATA:  [id number] Small bowel obstruction (HCC) [id number] EXAM: ABDOMEN - 1 VIEW COMPARISON:  X-ray abdomen 12/02/2023 8:32 a.m., CT abdomen pelvis 11/30/2023 FINDINGS: PO contrast opacifies several loops of dilated small bowel as well as the gastric lumen. Bowel suture staples noted within the right upper quadrant. No PO contrast identified within the colon. No radio-opaque calculi or other significant radiographic abnormality are seen. Vascular calcifications. IMPRESSION: PO contrast opacifies several loops of dilated small bowel as well as the gastric lumen. Electronically Signed   By: Tish Frederickson M.D.   On: 12/02/2023 18:13   DG Abd 1 View Result Date: 12/02/2023 CLINICAL DATA:  Enteric tube placement. EXAM: ABDOMEN - 1 VIEW COMPARISON:  Abdominal x-ray from same day at 0425 hours. FINDINGS: Interval advancement of the enteric tube now appropriately positioned within the stomach. Dilated loops of proximal small bowel are unchanged. IMPRESSION: 1. Interval  advancement of the enteric tube now appropriately positioned within the stomach. Electronically Signed   By: Obie Dredge M.D.   On: 12/02/2023 08:49   DG Abd 1 View Result Date: 12/02/2023 CLINICAL DATA:  [id number]. Encounter for imaging study confirm nasogastric tube placement. EXAM: ABDOMEN - 1 VIEW COMPARISON:  CT without contrast 11/30/2023 FINDINGS: 4:25 a.m. This is a high transverse abdomen view. The lung bases are clear. The cardiac size is normal. NGT has been inserted and the tip is at the EG junction and should be advanced into the stomach. Upper abdominal small bowel dilatation is little changed, small bowel maximum caliber 3.9 cm. Scattered bowel gas again is noted in the transverse colon. There are postsurgical changes in the medial right upper abdomen. IMPRESSION: 1. NGT tip at the EG junction and should be advanced into the stomach. 2. Upper abdominal small bowel dilatation is little changed. Electronically Signed   By: Almira Bar M.D.   On: 12/02/2023 05:04   US RENAL Result Date: 12/01/2023 CLINICAL DATA:  57 year old male with acute renal insufficiency. EXAM: RENAL / URINARY TRACT ULTRASOUND COMPLETE COMPARISON:  CT Abdomen and Pelvis yesterday. FINDINGS: Right Kidney: Renal measurements: 8.6 x 3.7 x 5.0 cm = volume: 82 mL. Maintained cortical echogenicity. No solid right renal mass or hydronephrosis. Left Kidney: Renal measurements: 8.4 x 4.2 x 4.3 cm = volume: 80 mL. Maintained cortical echogenicity. No solid  left renal mass or hydronephrosis. Bladder: Appears normal for degree of bladder distention. Other: Estimated prostate volume 12 mL. IMPRESSION: Bilateral renal volume loss, but otherwise negative ultrasound appearance of the kidneys and bladder. Electronically Signed   By: Odessa Fleming M.D.   On: 12/01/2023 10:15   CT ABDOMEN PELVIS WO CONTRAST Result Date: 11/30/2023 CLINICAL DATA:  Abdominal pain and vomiting for 3 days. EXAM: CT ABDOMEN AND PELVIS WITHOUT CONTRAST TECHNIQUE:  Multidetector CT imaging of the abdomen and pelvis was performed following the standard protocol without IV contrast. RADIATION DOSE REDUCTION: This exam was performed according to the departmental dose-optimization program which includes automated exposure control, adjustment of the mA and/or kV according to patient size and/or use of iterative reconstruction technique. COMPARISON:  06/27/2023 FINDINGS: Lower chest: No acute findings. Hepatobiliary: No mass visualized on this unenhanced exam. Pneumobilia is seen, consistent with prior sphincterotomy. Pancreas: Embolization coils again seen adjacent to the pancreatic head. No mass or inflammatory process visualized on this unenhanced exam. Spleen:  Within normal limits in size. Adrenals/Urinary tract: No evidence of urolithiasis or hydronephrosis. Unremarkable unopacified urinary bladder. Stomach/Bowel: Stomach and proximal small bowel loops are dilated. Surgical staples seen in the proximal small bowel in the right abdomen, with possible transition point noted just distally in the mid abdomen. This raises suspicion for small-bowel obstruction, possibly due to adhesion. No evidence of inflammatory process, or abnormal fluid collections. Vascular/Lymphatic: No pathologically enlarged lymph nodes identified. No evidence of abdominal aortic aneurysm. Reproductive:  No mass or other significant abnormality. Other:  None. Musculoskeletal:  No suspicious bone lesions identified. IMPRESSION: Dilated stomach and proximal small bowel, with possible transition point in the mid abdomen. This raises suspicion for small-bowel obstruction, possibly due to adhesion. No mass or inflammatory process identified. Electronically Signed   By: Danae Orleans M.D.   On: 11/30/2023 18:08   DG Chest Portable 1 View Result Date: 11/30/2023 CLINICAL DATA:  Chest pain, vomiting EXAM: PORTABLE CHEST 1 VIEW COMPARISON:  02/15/2022 FINDINGS: Defibrillator pads and other leads overlying the  chest. The heart size and mediastinal contours are within normal limits. Both lungs are clear. The visualized skeletal structures are unremarkable. IMPRESSION: No acute abnormality of the lungs in AP portable projection. Electronically Signed   By: Jearld Lesch M.D.   On: 11/30/2023 16:49    Microbiology: Results for orders placed or performed during the hospital encounter of 11/30/23  Resp panel by RT-PCR (RSV, Flu A&B, Covid) Anterior Nasal Swab     Status: None   Collection Time: 11/30/23  3:57 PM   Specimen: Anterior Nasal Swab  Result Value Ref Range Status   SARS Coronavirus 2 by RT PCR NEGATIVE NEGATIVE Final    Comment: (NOTE) SARS-CoV-2 target nucleic acids are NOT DETECTED.  The SARS-CoV-2 RNA is generally detectable in upper respiratory specimens during the acute phase of infection. The lowest concentration of SARS-CoV-2 viral copies this assay can detect is 138 copies/mL. A negative result does not preclude SARS-Cov-2 infection and should not be used as the sole basis for treatment or other patient management decisions. A negative result may occur with  improper specimen collection/handling, submission of specimen other than nasopharyngeal swab, presence of viral mutation(s) within the areas targeted by this assay, and inadequate number of viral copies(<138 copies/mL). A negative result must be combined with clinical observations, patient history, and epidemiological information. The expected result is Negative.  Fact Sheet for Patients:  BloggerCourse.com  Fact Sheet for Healthcare Providers:  SeriousBroker.it  This  test is no t yet approved or cleared by the [place] and  has been authorized for detection and/or diagnosis of SARS-CoV-2 by FDA under an Emergency Use Authorization (EUA). This EUA will remain  in effect (meaning this test can be used) for the duration of the COVID-19 declaration under Section  564(b)(1) of the Act, 21 U.S.C.section 360bbb-3(b)(1), unless the authorization is terminated  or revoked sooner.       Influenza A by PCR NEGATIVE NEGATIVE Final   Influenza B by PCR NEGATIVE NEGATIVE Final    Comment: (NOTE) The Xpert Xpress SARS-CoV-2/FLU/RSV plus assay is intended as an aid in the diagnosis of influenza from Nasopharyngeal swab specimens and should not be used as a sole basis for treatment. Nasal washings and aspirates are unacceptable for Xpert Xpress SARS-CoV-2/FLU/RSV testing.  Fact Sheet for Patients: BloggerCourse.com  Fact Sheet for Healthcare Providers: SeriousBroker.it  This test is not yet approved or cleared by the [place] FDA and has been authorized for detection and/or diagnosis of SARS-CoV-2 by FDA under an Emergency Use Authorization (EUA). This EUA will remain in effect (meaning this test can be used) for the duration of the COVID-19 declaration under Section 564(b)(1) of the Act, 21 U.S.C. section 360bbb-3(b)(1), unless the authorization is terminated or revoked.     Resp Syncytial Virus by PCR NEGATIVE NEGATIVE Final    Comment: (NOTE) Fact Sheet for Patients: BloggerCourse.com  Fact Sheet for Healthcare Providers: SeriousBroker.it  This test is not yet approved or cleared by the [place] FDA and has been authorized for detection and/or diagnosis of SARS-CoV-2 by FDA under an Emergency Use Authorization (EUA). This EUA will remain in effect (meaning this test can be used) for the duration of the COVID-19 declaration under Section 564(b)(1) of the Act, 21 U.S.C. section 360bbb-3(b)(1), unless the authorization is terminated or revoked.  Performed at [facility], [address] [place], [place] [address]     Labs: CBC: Recent Labs  Lab 11/30/23 1550 12/01/23 0504 12/02/23 0630 12/04/23 0502  WBC 15.6*  17.1* 13.7* 13.1*  NEUTROABS 13.4*  --   --   --   HGB 16.2 14.2 12.9* 13.6  HCT 50.3 42.5 39.0 42.3  MCV 75.3* 74.3* 73.6* 75.5*  PLT 319 269 277 321   Basic Metabolic Panel: Recent Labs  Lab 12/02/23 0630 12/03/23 0450 12/04/23 0502 12/05/23 0747 12/06/23 0527  NA 133* 141 144 145 143  K 3.2* 3.4* 3.8 3.8 3.6  CL 94* 98 107 110 104  CO2 26 25 22 24 27   GLUCOSE 95 89 91 125* 94  BUN 62* 56* 49* 39* 34*  CREATININE 2.94* 2.01* 1.72* 1.46* 1.46*  CALCIUM 8.7* 9.6 9.2 9.5 10.1  MG  --  2.6*  --  2.4 2.3  PHOS 3.8  --   --  2.1* 4.2   Liver Function Tests: Recent Labs  Lab 11/30/23 1550 12/01/23 0504 12/06/23 0527  AST 44* 36 51*  ALT 20 23 41  ALKPHOS 48 35* 54  BILITOT 1.1 1.3* 0.9  PROT 10.1* 7.6 8.8*  ALBUMIN 5.7* 4.1 4.8   CBG: Recent Labs  Lab 11/30/23 1551 12/05/23 0721 12/05/23 2319 12/06/23 0526 12/06/23 1131  GLUCAP 155* 135* 122* 104* 120*    Discharge time spent: greater than 30 minutes.  Signed: Marrion Coy, MD [facility] 12/06/2023

## 2023-12-07 LAB — HEMOGLOBIN A1C
Hgb A1c MFr Bld: 5.7 % — ABNORMAL HIGH (ref 4.8–5.6)
Mean Plasma Glucose: 117 mg/dL

## 2023-12-22 ENCOUNTER — Ambulatory Visit (INDEPENDENT_AMBULATORY_CARE_PROVIDER_SITE_OTHER): Payer: MEDICAID | Admitting: Internal Medicine

## 2023-12-22 ENCOUNTER — Encounter: Payer: Self-pay | Admitting: Internal Medicine

## 2023-12-22 VITALS — BP 122/75 | HR 103 | Temp 97.5°F | Ht 66.0 in | Wt 142.3 lb

## 2023-12-22 DIAGNOSIS — K565 Intestinal adhesions [bands], unspecified as to partial versus complete obstruction: Secondary | ICD-10-CM

## 2023-12-22 DIAGNOSIS — Z1211 Encounter for screening for malignant neoplasm of colon: Secondary | ICD-10-CM

## 2023-12-22 DIAGNOSIS — K219 Gastro-esophageal reflux disease without esophagitis: Secondary | ICD-10-CM | POA: Diagnosis not present

## 2023-12-22 DIAGNOSIS — Z8719 Personal history of other diseases of the digestive system: Secondary | ICD-10-CM

## 2023-12-22 DIAGNOSIS — Z800 Family history of malignant neoplasm of digestive organs: Secondary | ICD-10-CM | POA: Diagnosis not present

## 2023-12-22 DIAGNOSIS — E8809 Other disorders of plasma-protein metabolism, not elsewhere classified: Secondary | ICD-10-CM

## 2023-12-22 DIAGNOSIS — E871 Hypo-osmolality and hyponatremia: Secondary | ICD-10-CM

## 2023-12-22 NOTE — Patient Instructions (Signed)
 I am going to check blood work today at [facility].  We will call with results.  Continue on pantoprazole .  Continue follow-up with your surgeon.  Follow-up in 3 to 4 months.  We can discuss scheduling colonoscopy at that time.  It was very nice seeing both you again today.  Dr. Mordechai April

## 2023-12-23 LAB — COMPREHENSIVE METABOLIC PANEL
AG Ratio: 1.3 (calc) (ref 1.0–2.5)
ALT: 11 U/L (ref 9–46)
AST: 13 U/L (ref 10–35)
Albumin: 4 g/dL (ref 3.6–5.1)
Alkaline phosphatase (APISO): 43 U/L (ref 35–144)
BUN: 12 mg/dL (ref 7–25)
CO2: 24 mmol/L (ref 20–32)
Calcium: 9.7 mg/dL (ref 8.6–10.3)
Chloride: 105 mmol/L (ref 98–110)
Creat: 1.14 mg/dL (ref 0.70–1.30)
Globulin: 3.1 g/dL (ref 1.9–3.7)
Glucose, Bld: 76 mg/dL (ref 65–139)
Potassium: 4.2 mmol/L (ref 3.5–5.3)
Sodium: 138 mmol/L (ref 135–146)
Total Bilirubin: 0.6 mg/dL (ref 0.2–1.2)
Total Protein: 7.1 g/dL (ref 6.1–8.1)

## 2024-02-06 NOTE — Progress Notes (Signed)
 Referring Provider: Fanny Dance, FNP Primary Care Physician:  Fanny Dance, FNP Primary GI:  Dr. Marletta Lor  Chief Complaint  Patient presents with   Follow-up    Patient here today for a follow up. Patient had gallbladder removed on 10/20/2023, Had a small bowel obstruction on 11/30/2023 and was transferred to [facility] for continuing care on 12/06/2023 and had surgery on 12/08/2023.     HPI:   Wesley Francis is a 58 y.o. male who presents to clinic today for hospital follow-up visit.  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  See below for more information.  Presented with obstructive jaundice/cholangitis in July 2024 transferred to [facility].  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    ERCP 07/27/2023 again with choledocholithiasis s/p extension of sphincterotomy, stone retrieval and stent placement, removal of PD stent.  The coils and parts of stone embedded with coil could not be retrieved.  He is advised to repeat ERCP with spyglass in 6 weeks.    Met with Dr. Chestine Spore of hepatobiliary surgery 08/23/2023 with recommendations to proceed with cholecystectomy, common bile duct exploration with possible biliary bypass.   ERCP 09/14/2023 with spyglass showed choledocholithiasis with EHL, Cabbell and common hepatic duct could not be removed.  10/20/2023 underwent robotic common bile duct exploration, roux en y hepaticojejunostomy, and cholecystectomy.  Hospitalization complicated by postoperative hypoxia, atrial fibrillation with RVR.  Pathology with benign findings.  Admitted  to [facility] 12/06/2023 with small bowel obstruction.  12/07/2023 underwent exploratory laparotomy with lysis of adhesive band distal to the J-J anastomosis. .  Recovered postoperatively and was discharged home.  Has follow-up scheduled 12/28/2023 with general surgery.  Today, states he is doing well.  Starting to introduce more things into his diet.  No abdominal pain.  Bowels moving well.  No melena hematochezia.  Past Medical History:  Diagnosis Date   AKI (acute kidney injury) (HCC) 04/04/2016   Anemia    Arthritis    Asthma    ETOH abuse    Folate deficiency 04/05/2016   Heme positive stool 04/05/2016   Hypertension     Past Surgical History:  Procedure Laterality Date   BIOPSY  03/10/2022   Procedure: BIOPSY;  Surgeon: Dolores Frame, MD;  Location: [facility];  Service: Gastroenterology;;   COLONOSCOPY     COLONOSCOPY WITH PROPOFOL N/A 03/02/2017   Procedure: COLONOSCOPY WITH PROPOFOL;  Surgeon: Christena Deem, MD;  Location: [facility] ENDOSCOPY;  Service: Endoscopy;  Laterality: N/A;   COLONOSCOPY WITH PROPOFOL N/A 07/08/2020   Procedure: COLONOSCOPY WITH PROPOFOL;  Surgeon: Regis Bill, MD;  Location: [facility] ENDOSCOPY;  Service: Endoscopy;  Laterality: N/A;   ESOPHAGOGASTRODUODENOSCOPY (EGD) WITH PROPOFOL N/A 07/08/2020   Procedure: ESOPHAGOGASTRODUODENOSCOPY (EGD) WITH PROPOFOL;  Surgeon: Regis Bill, MD;  Location: [facility] ENDOSCOPY;  Service: Endoscopy;  Laterality: N/A;   ESOPHAGOGASTRODUODENOSCOPY (EGD) WITH PROPOFOL N/A 03/10/2022   Procedure: ESOPHAGOGASTRODUODENOSCOPY (EGD) WITH PROPOFOL;  Surgeon: Dolores Frame, MD;  Location: [facility];  Service: Gastroenterology;  Laterality: N/A;   ESOPHAGOGASTRODUODENOSCOPY (EGD) WITH PROPOFOL N/A 06/22/2022   Procedure: ESOPHAGOGASTRODUODENOSCOPY (EGD) WITH  PROPOFOL;  Surgeon: Lanelle Bal, DO;  Location: [facility];  Service: Endoscopy;  Laterality: N/A;  11:45am   FRACTURE SURGERY      IR ANGIOGRAM FOLLOW UP STUDY  03/15/2022   IR ANGIOGRAM SELECTIVE EACH ADDITIONAL VESSEL  03/15/2022   IR ANGIOGRAM VISCERAL SELECTIVE  03/15/2022   IR ANGIOGRAM VISCERAL SELECTIVE  03/15/2022   IR EMBO ART  VEN HEMORR LYMPH EXTRAV  INC GUIDE ROADMAPPING  03/15/2022   IR US GUIDE VASC ACCESS RIGHT  03/15/2022   KNEE ARTHROCENTESIS      Current Outpatient Medications  Medication Sig Dispense Refill   amLODipine (NORVASC) 5 MG tablet Take 5 mg by mouth daily.     folic acid (FOLVITE) 1 MG tablet Take 1 mg by mouth daily.     pantoprazole (PROTONIX) 40 MG tablet Take 40 mg by mouth 2 (two) times daily.     Vitamin D, Ergocalciferol, 50 MCG (2000 UT) CAPS Take by mouth every other day.     No current facility-administered medications for this visit.    Allergies as of 12/22/2023   (No Known Allergies)    Family History  Problem Relation Age of Onset   Hypertension Mother    Stroke Mother    Hypertension Father    Colon cancer Father        59s   Colon polyps Sister        66s   Colon cancer Paternal Uncle        58s   Colon cancer Paternal Uncle        97s   Colon cancer Paternal Uncle        37s    Social History   Socioeconomic History   Marital status: Single    Spouse name: Not on file   Number of children: Not on file   Years of education: Not on file   Highest education level: Not on file  Occupational History   Not on file  Tobacco Use   Smoking status: Every Day    Current packs/day: 0.50    Types: Cigarettes    Passive exposure: Current   Smokeless tobacco: Never  Vaping Use   Vaping status: Never Used  Substance and Sexual Activity   Alcohol use: Not Currently    Comment: 24 oz beer daily   Drug use: No   Sexual activity: Not on file  Other Topics Concern   Not on file  Social History Narrative   Not on file   Social Drivers of Health   Financial Resource Strain: Not on file  Food Insecurity: No Food Insecurity (12/01/2023)   Hunger Vital Sign     Worried About Running Out of Food in the Last Year: Never true    Ran Out of Food in the Last Year: Never true  Transportation Needs: No Transportation Needs (12/01/2023)   PRAPARE - Administrator, Civil Service (Medical): No    Lack of Transportation (Non-Medical): No  Physical Activity: Not on file  Stress: Not on file  Social Connections: Not on file    Subjective: Review of Systems  Constitutional:  Negative for chills and fever.  HENT:  Negative for congestion and hearing loss.   Eyes:  Negative for blurred vision and double vision.  Respiratory:  Negative for cough and shortness of breath.   Cardiovascular:  Negative for chest pain and palpitations.  Gastrointestinal:  Negative for abdominal pain, blood in stool, constipation, diarrhea, heartburn, melena and  vomiting.  Genitourinary:  Negative for dysuria and urgency.  Musculoskeletal:  Negative for joint pain and myalgias.  Skin:  Negative for itching and rash.  Neurological:  Negative for dizziness and headaches.  Psychiatric/Behavioral:  Negative for depression. The patient is not nervous/anxious.      Objective: BP 122/75 (BP Location: Left Arm, Patient Position: Sitting, Cuff Size: Normal)   Pulse (!) 103   Temp (!) 97.5 F (36.4 C) (Temporal)   Ht 5\' 6"  (1.676 m)   Wt 142 lb 4.8 oz (64.5 kg)   BMI 22.97 kg/m  Physical Exam Constitutional:      Appearance: Normal appearance.  HENT:     Head: Normocephalic and atraumatic.  Eyes:     Extraocular Movements: Extraocular movements intact.     Conjunctiva/sclera: Conjunctivae normal.  Cardiovascular:     Rate and Rhythm: Normal rate and regular rhythm.  Pulmonary:     Effort: Pulmonary effort is normal.     Breath sounds: Normal breath sounds.  Abdominal:     General: Bowel sounds are normal.     Palpations: Abdomen is soft.  Musculoskeletal:        General: Normal range of motion.     Cervical back: Normal range of motion and neck supple.   Skin:    General: Skin is warm.  Neurological:     General: No focal deficit present.     Mental Status: He is alert and oriented to person, place, and time.  Psychiatric:        Mood and Affect: Mood normal.        Behavior: Behavior normal.      Assessment/Plan:  1.  Biliary obstruction, choledocholithiasis, coil erosion of the distal CBD w/ choledocho duodenal fistula-s/p robotic common bile duct exploration, roux en y hepaticojejunostomy, and cholecystectomy 10/20/2023.  Patient appears to be recovering well.  Will check CMP today.  Continue to follow-up with surgeon at [facility].  2.  Recent small bowel obstruction due to adhesions-s/p exploratory laparotomy with lysis of adhesive band distal to the J-J anastomosis 12/07/23.  Recovering well.  Tolerating diet.  Bowels moving well.  Continue to monitor.  3.  Chronic GERD-well-controlled on pantoprazole, will continue  4.  Family history of colorectal malignancy-overdue for screening colonoscopy.  Given his multiple recent hospitalizations and surgeries, patient would like to continue to recover prior to pursuing this.  Will discuss further on follow-up visit in 3 to 4 months.   02/06/2024 12:45 PM   Disclaimer: This note was dictated with voice recognition software. Similar sounding words can inadvertently be transcribed and may not be corrected upon review.

## 2024-03-15 ENCOUNTER — Encounter: Payer: Self-pay | Admitting: Internal Medicine

## 2024-06-23 ENCOUNTER — Other Ambulatory Visit: Payer: Self-pay | Admitting: Internal Medicine

## 2024-06-23 DIAGNOSIS — I422 Other hypertrophic cardiomyopathy: Secondary | ICD-10-CM

## 2024-07-09 NOTE — Procedures (Signed)
 72-hour Holter Indication paroxysmal atrial fibrillation Hookup date June 22 to 25, 2025 Patient wore the Holter for about 3 days  Total beats 288,317 Minimum rate is 58 maximum 154 average 80 Mostly sinus rhythm Very rare PVCs Rare PACs No sustained runs No pauses No high-grade block No ST segment changes No diary submitted No evidence of atrial fibrillation   Conclusion Relatively benign Holter

## 2024-08-14 ENCOUNTER — Ambulatory Visit
Admission: RE | Admit: 2024-08-14 | Discharge: 2024-08-14 | Disposition: A | Discharge status: Home / Self Care | Payer: MEDICAID | Source: Ambulatory Visit | Attending: Internal Medicine | Admitting: Internal Medicine

## 2024-08-14 ENCOUNTER — Other Ambulatory Visit: Payer: Self-pay | Admitting: Internal Medicine

## 2024-08-14 DIAGNOSIS — I422 Other hypertrophic cardiomyopathy: Secondary | ICD-10-CM

## 2024-08-14 MED ORDER — GADOBUTROL 1 MMOL/ML IV SOLN
9.0000 mL | Freq: Once | INTRAVENOUS | Status: AC | PRN
  Administered 2024-08-14: 9 mL via INTRAVENOUS

## 2024-08-28 ENCOUNTER — Other Ambulatory Visit: Payer: Self-pay | Admitting: Emergency Medicine

## 2024-08-28 DIAGNOSIS — F17210 Nicotine dependence, cigarettes, uncomplicated: Secondary | ICD-10-CM

## 2024-08-28 DIAGNOSIS — Z122 Encounter for screening for malignant neoplasm of respiratory organs: Secondary | ICD-10-CM

## 2024-08-31 ENCOUNTER — Ambulatory Visit
Admission: RE | Admit: 2024-08-31 | Discharge: 2024-08-31 | Disposition: A | Discharge status: Home / Self Care | Payer: MEDICAID | Source: Ambulatory Visit | Attending: Emergency Medicine | Admitting: Emergency Medicine

## 2024-08-31 DIAGNOSIS — F17210 Nicotine dependence, cigarettes, uncomplicated: Secondary | ICD-10-CM | POA: Insufficient documentation

## 2024-08-31 DIAGNOSIS — Z122 Encounter for screening for malignant neoplasm of respiratory organs: Secondary | ICD-10-CM | POA: Diagnosis present
